# Patient Record
Sex: Female | Born: 1935
Health system: Southern US, Community
[De-identification: ages and names within clinical notes are randomized; demographics above are authoritative.]

## PROBLEM LIST (undated history)

## (undated) DIAGNOSIS — K219 Gastro-esophageal reflux disease without esophagitis: Secondary | ICD-10-CM

## (undated) DIAGNOSIS — I10 Essential (primary) hypertension: Secondary | ICD-10-CM

## (undated) DIAGNOSIS — T7840XA Allergy, unspecified, initial encounter: Secondary | ICD-10-CM

## (undated) DIAGNOSIS — E785 Hyperlipidemia, unspecified: Secondary | ICD-10-CM

## (undated) DIAGNOSIS — N189 Chronic kidney disease, unspecified: Secondary | ICD-10-CM

## (undated) HISTORY — DX: Chronic kidney disease, unspecified: N18.9

## (undated) HISTORY — DX: Essential (primary) hypertension: I10

## (undated) HISTORY — DX: Hyperlipidemia, unspecified: E78.5

## (undated) HISTORY — DX: Allergy, unspecified, initial encounter: T78.40XA

## (undated) HISTORY — PX: TUBAL LIGATION: SHX77

## (undated) HISTORY — PX: OTHER SURGICAL HISTORY: SHX169

## (undated) HISTORY — PX: APPENDECTOMY: SHX54

---

## 2014-02-22 ENCOUNTER — Ambulatory Visit: Payer: Self-pay | Admitting: Family Medicine

## 2014-02-26 ENCOUNTER — Ambulatory Visit: Payer: Self-pay | Admitting: Gastroenterology

## 2014-03-23 ENCOUNTER — Ambulatory Visit: Payer: Self-pay | Admitting: Gastroenterology

## 2014-07-16 ENCOUNTER — Inpatient Hospital Stay: Payer: Self-pay | Admitting: Internal Medicine

## 2014-07-16 LAB — CBC WITH DIFFERENTIAL/PLATELET
Basophil #: 0.1 x10 3/mm 3 (ref 0.0–0.1)
Basophil %: 0.3 %
Eosinophil #: 0 x10 3/mm 3 (ref 0.0–0.7)
Eosinophil %: 0.1 %
HCT: 40.2 % (ref 35.0–47.0)
HGB: 12.9 g/dL (ref 12.0–16.0)
Lymphocyte %: 2.6 %
Lymphs Abs: 0.5 x10 3/mm 3 — ABNORMAL LOW (ref 1.0–3.6)
MCH: 29.1 pg (ref 26.0–34.0)
MCHC: 32 g/dL (ref 32.0–36.0)
MCV: 91 fL (ref 80–100)
Monocyte #: 1 x10 3/mm — ABNORMAL HIGH (ref 0.2–0.9)
Monocyte %: 5 %
Neutrophil #: 18.9 x10 3/mm 3 — ABNORMAL HIGH (ref 1.4–6.5)
Neutrophil %: 92 %
Platelet: 115 x10 3/mm 3 — ABNORMAL LOW (ref 150–440)
RBC: 4.42 X10 6/mm 3 (ref 3.80–5.20)
RDW: 15.3 % — ABNORMAL HIGH (ref 11.5–14.5)
WBC: 20.6 x10 3/mm 3 — ABNORMAL HIGH (ref 3.6–11.0)

## 2014-07-16 LAB — URINALYSIS, COMPLETE
Bilirubin,UR: NEGATIVE
Glucose,UR: NEGATIVE mg/dL (ref 0–75)
Granular Cast: 9
Hyaline Cast: 5
Ketone: NEGATIVE
Nitrite: NEGATIVE
Ph: 5 (ref 4.5–8.0)
Protein: 30
RBC,UR: 15 /HPF (ref 0–5)
Specific Gravity: 1.016 (ref 1.003–1.030)
Squamous Epithelial: 3
Transitional Epi: 3
WBC UR: 271 /HPF (ref 0–5)

## 2014-07-16 LAB — CK-MB
CK-MB: 3.5 ng/mL (ref 0.5–3.6)
CK-MB: 2.9 ng/mL (ref 0.5–3.6)

## 2014-07-16 LAB — BASIC METABOLIC PANEL WITH GFR
Anion Gap: 10 (ref 7–16)
BUN: 54 mg/dL — ABNORMAL HIGH (ref 7–18)
Calcium, Total: 9.1 mg/dL (ref 8.5–10.1)
Chloride: 104 mmol/L (ref 98–107)
Co2: 24 mmol/L (ref 21–32)
Creatinine: 2.3 mg/dL — ABNORMAL HIGH (ref 0.60–1.30)
EGFR (African American): 26 — ABNORMAL LOW
EGFR (Non-African Amer.): 21 — ABNORMAL LOW
Glucose: 105 mg/dL — ABNORMAL HIGH (ref 65–99)
Osmolality: 291 (ref 275–301)
Potassium: 4.1 mmol/L (ref 3.5–5.1)
Sodium: 138 mmol/L (ref 136–145)

## 2014-07-16 LAB — TROPONIN I
TROPONIN-I: 0.47 ng/mL — AB
Troponin-I: 0.67 ng/mL — ABNORMAL HIGH

## 2014-07-17 LAB — BASIC METABOLIC PANEL
Anion Gap: 10 (ref 7–16)
BUN: 48 mg/dL — ABNORMAL HIGH (ref 7–18)
CHLORIDE: 106 mmol/L (ref 98–107)
Calcium, Total: 8.2 mg/dL — ABNORMAL LOW (ref 8.5–10.1)
Co2: 23 mmol/L (ref 21–32)
Creatinine: 1.9 mg/dL — ABNORMAL HIGH (ref 0.60–1.30)
EGFR (African American): 32 — ABNORMAL LOW
GFR CALC NON AF AMER: 26 — AB
Glucose: 109 mg/dL — ABNORMAL HIGH (ref 65–99)
OSMOLALITY: 291 (ref 275–301)
Potassium: 4 mmol/L (ref 3.5–5.1)
SODIUM: 139 mmol/L (ref 136–145)

## 2014-07-17 LAB — CBC WITH DIFFERENTIAL/PLATELET
BASOS ABS: 0.1 10*3/uL (ref 0.0–0.1)
Basophil %: 0.4 %
EOS ABS: 0.1 10*3/uL (ref 0.0–0.7)
Eosinophil %: 0.6 %
HCT: 36.9 % (ref 35.0–47.0)
HGB: 12 g/dL (ref 12.0–16.0)
Lymphocyte #: 0.6 10*3/uL — ABNORMAL LOW (ref 1.0–3.6)
Lymphocyte %: 3.3 %
MCH: 29.3 pg (ref 26.0–34.0)
MCHC: 32.4 g/dL (ref 32.0–36.0)
MCV: 90 fL (ref 80–100)
MONOS PCT: 6.4 %
Monocyte #: 1.1 x10 3/mm — ABNORMAL HIGH (ref 0.2–0.9)
NEUTROS ABS: 15.3 10*3/uL — AB (ref 1.4–6.5)
Neutrophil %: 89.3 %
PLATELETS: 92 10*3/uL — AB (ref 150–440)
RBC: 4.08 10*6/uL (ref 3.80–5.20)
RDW: 15 % — ABNORMAL HIGH (ref 11.5–14.5)
WBC: 17.1 10*3/uL — ABNORMAL HIGH (ref 3.6–11.0)

## 2014-07-17 LAB — CK-MB: CK-MB: 1.8 ng/mL (ref 0.5–3.6)

## 2014-07-17 LAB — HEMOGLOBIN A1C: HEMOGLOBIN A1C: 5.5 % (ref 4.2–6.3)

## 2014-07-17 LAB — MAGNESIUM: Magnesium: 1.5 mg/dL — ABNORMAL LOW

## 2014-07-18 LAB — BASIC METABOLIC PANEL
Anion Gap: 7 (ref 7–16)
BUN: 38 mg/dL — ABNORMAL HIGH (ref 7–18)
Calcium, Total: 7.9 mg/dL — ABNORMAL LOW (ref 8.5–10.1)
Chloride: 110 mmol/L — ABNORMAL HIGH (ref 98–107)
Co2: 24 mmol/L (ref 21–32)
Creatinine: 1.78 mg/dL — ABNORMAL HIGH (ref 0.60–1.30)
EGFR (African American): 34 — ABNORMAL LOW
EGFR (Non-African Amer.): 28 — ABNORMAL LOW
Glucose: 117 mg/dL — ABNORMAL HIGH (ref 65–99)
Osmolality: 291 (ref 275–301)
POTASSIUM: 4.3 mmol/L (ref 3.5–5.1)
SODIUM: 141 mmol/L (ref 136–145)

## 2014-07-18 LAB — CBC WITH DIFFERENTIAL/PLATELET
BASOS PCT: 0.3 %
Basophil #: 0 10*3/uL (ref 0.0–0.1)
EOS PCT: 2.4 %
Eosinophil #: 0.3 10*3/uL (ref 0.0–0.7)
HCT: 35 % (ref 35.0–47.0)
HGB: 11.4 g/dL — AB (ref 12.0–16.0)
LYMPHS ABS: 0.5 10*3/uL — AB (ref 1.0–3.6)
LYMPHS PCT: 3.8 %
MCH: 29.4 pg (ref 26.0–34.0)
MCHC: 32.6 g/dL (ref 32.0–36.0)
MCV: 90 fL (ref 80–100)
Monocyte #: 0.8 x10 3/mm (ref 0.2–0.9)
Monocyte %: 6.9 %
Neutrophil #: 10.3 10*3/uL — ABNORMAL HIGH (ref 1.4–6.5)
Neutrophil %: 86.6 %
Platelet: 94 10*3/uL — ABNORMAL LOW (ref 150–440)
RBC: 3.88 10*6/uL (ref 3.80–5.20)
RDW: 14.9 % — ABNORMAL HIGH (ref 11.5–14.5)
WBC: 11.9 10*3/uL — AB (ref 3.6–11.0)

## 2014-07-18 LAB — MAGNESIUM: Magnesium: 2.5 mg/dL — ABNORMAL HIGH

## 2014-07-18 LAB — URINE CULTURE

## 2014-07-19 LAB — BASIC METABOLIC PANEL
Anion Gap: 7 (ref 7–16)
BUN: 37 mg/dL — AB (ref 7–18)
CALCIUM: 8.5 mg/dL (ref 8.5–10.1)
CO2: 23 mmol/L (ref 21–32)
CREATININE: 1.71 mg/dL — AB (ref 0.60–1.30)
Chloride: 110 mmol/L — ABNORMAL HIGH (ref 98–107)
EGFR (African American): 36 — ABNORMAL LOW
GFR CALC NON AF AMER: 30 — AB
GLUCOSE: 109 mg/dL — AB (ref 65–99)
OSMOLALITY: 289 (ref 275–301)
POTASSIUM: 4.3 mmol/L (ref 3.5–5.1)
SODIUM: 140 mmol/L (ref 136–145)

## 2014-07-21 LAB — CULTURE, BLOOD (SINGLE)

## 2014-08-04 ENCOUNTER — Ambulatory Visit: Payer: Self-pay | Admitting: Family Medicine

## 2014-08-05 ENCOUNTER — Encounter: Payer: Self-pay | Admitting: Podiatry

## 2014-08-05 ENCOUNTER — Ambulatory Visit (INDEPENDENT_AMBULATORY_CARE_PROVIDER_SITE_OTHER): Payer: BLUE CROSS/BLUE SHIELD | Admitting: Podiatry

## 2014-08-05 VITALS — BP 104/60 | HR 78 | Resp 16 | Ht 59.0 in | Wt 122.0 lb

## 2014-08-05 DIAGNOSIS — M79676 Pain in unspecified toe(s): Secondary | ICD-10-CM

## 2014-08-05 DIAGNOSIS — L600 Ingrowing nail: Secondary | ICD-10-CM

## 2014-08-05 DIAGNOSIS — B351 Tinea unguium: Secondary | ICD-10-CM

## 2014-08-05 DIAGNOSIS — B353 Tinea pedis: Secondary | ICD-10-CM

## 2014-08-05 MED ORDER — NAFTIFINE HCL 2 % EX CREA: 1.0000 "application " | TOPICAL_CREAM | CUTANEOUS | Status: DC

## 2014-08-05 NOTE — Progress Notes (Signed)
   Subjective:    Patient ID: Kimberly Abbott, female    DOB: 03-16-1921, 81 y.o.   MRN: [id number]  HPI Comments: 81 year old female presents the office with complaints of painful, elongated toenails with ingrowing on both of the big toes as well as the second toes. She states this is intermittent for years and has been progressive. She states that the toenails are painful with shoes as they are causing pressure. She also states that she has an area on the bottom of her right foot which has some peeling, dry skin with intermittent itching. She states that she has seen multiple other physicians for this however she has not been able to cure this problem. She denies any recent redness or drainage from the nail sites or along the skin lesion on the bottom of her foot. No other complaints at this time.     Review of Systems  Constitutional: Positive for appetite change and fatigue.  HENT: Positive for hearing loss and trouble swallowing.   Psychiatric/Behavioral: The patient is nervous/anxious.   All other systems reviewed and are negative.      Objective:   Physical Exam AAO 3, NAD DP/PT pulses palpable, CRT less than 3 seconds Protective sensation appears to be intact with Derrel Nip monofilament, vibratory sensation intact, Achilles tendon reflex intact. Nails hypertrophic, dystrophic, elongated, brittle, discolored 10. There is incurvation upon nails particularly the bilateral hallux and second digits on both the medial and lateral nail border. There is no surrounding erythema or drainage from the nail sites. On the plantar aspect of the right midfoot there is a area of dry, peeling, scaly skin with a slight erythematous base which is most likely consistent with tinea pedis. No open lesions or pre-ulcerative lesions are identified. No interdigital maceration. No areas of pinpoint bony tenderness or pain with vibratory sensation bilaterally. There is bilateral HAV deformity present. No  pain with calf compression, swelling, warmth, erythema.       Assessment & Plan:  82 year old female with symptomatic onychomycosis/ingrown toenail; likely tinea pedis. -Treatment options were discussed the patient including alternatives, risks, complications. -Nail sharply debrided 10 without complication/bleeding. -For the area on the plantar aspect of the right foot at most likely consistent with tinea pedis. We'll prescribed Naftin. After this treatment if there is no resolution we'll likely refer to dermatology. -Discussed the importance of daily foot inspection. -Follow-up in 3 months or sooner should any problems arise. In the meantime, encouraged to call the office with any questions, concerns, change in symptoms. Follow-up with PCP for other issues mentioned in the ROS.

## 2014-09-13 ENCOUNTER — Ambulatory Visit: Payer: Self-pay | Admitting: Nephrology

## 2014-09-24 LAB — HM MAMMOGRAPHY: HM Mammogram: NORMAL

## 2014-10-21 ENCOUNTER — Ambulatory Visit (INDEPENDENT_AMBULATORY_CARE_PROVIDER_SITE_OTHER): Payer: Medicare Other | Admitting: Podiatry

## 2014-10-21 DIAGNOSIS — B351 Tinea unguium: Secondary | ICD-10-CM | POA: Diagnosis not present

## 2014-10-21 DIAGNOSIS — M79676 Pain in unspecified toe(s): Secondary | ICD-10-CM | POA: Diagnosis not present

## 2014-10-21 DIAGNOSIS — B353 Tinea pedis: Secondary | ICD-10-CM

## 2014-10-21 MED ORDER — NAFTIFINE HCL 2 % EX CREA: 1.0000 "application " | TOPICAL_CREAM | CUTANEOUS | Status: DC

## 2014-10-24 NOTE — Progress Notes (Signed)
Patient ID: Kimberly Abbott, female   DOB: [birth year], 81 y.o.   MRN: [id number]  Subjective: 81 y.o.-year-old female returns the office today for painful, elongated, thickened, ingrown toenails for which she is unable to trim herself. Denies any redness or drainage around the nails. She has been using the Naftin cream for the right foot which she believes is helping and is requesting a refill. Denies any acute changes since last appointment and no new complaints today. Denies any systemic complaints such as fevers, chills, nausea, vomiting.   Objective: AAO 3, NAD DP/PT pulses palpable, CRT less than 3 seconds Protective sensation intact with Simms Weinstein monofilament, Achilles tendon reflex intact.  Nails hypertrophic, dystrophic, elongated, brittle, discolored 10. There is also incurvation on both the medial and lateral aspects of the nails to all digits which she states is painful. There is tenderness overlying the nails 1-5 bilaterally. There is no surrounding erythema or drainage along the nail sites. No open lesions or pre-ulcerative lesions are identified. On the plantar aspect of the right midfoot there is a small area dry, place, scaly skin which is much improved compared to last appointment. No other areas of tenderness bilateral lower extremities. No overlying edema, erythema, increased warmth. No pain with calf compression, swelling, warmth, erythema.  Assessment: Patient presents with symptomatic onychomycosis/ingrown toenails without signs of infection; tinea pedis  Plan: -Treatment options including alternatives, risks, complications were discussed -Nails sharply debrided 10 without complication/bleeding. -Refilled naftin as the area appears improved but not yet resolved.  -Discussed daily foot inspection. If there are any changes, to call the office immediately.  -Follow-up in 3 months or sooner if any problems are to arise. In the meantime, encouraged to call the office  with any questions, concerns, changes symptoms.

## 2014-11-04 ENCOUNTER — Ambulatory Visit: Payer: BLUE CROSS/BLUE SHIELD | Admitting: Podiatry

## 2014-11-08 DIAGNOSIS — L259 Unspecified contact dermatitis, unspecified cause: Secondary | ICD-10-CM | POA: Insufficient documentation

## 2014-11-08 DIAGNOSIS — K219 Gastro-esophageal reflux disease without esophagitis: Secondary | ICD-10-CM | POA: Insufficient documentation

## 2014-11-08 DIAGNOSIS — M75101 Unspecified rotator cuff tear or rupture of right shoulder, not specified as traumatic: Secondary | ICD-10-CM | POA: Insufficient documentation

## 2014-11-08 DIAGNOSIS — N183 Chronic kidney disease, stage 3 unspecified: Secondary | ICD-10-CM | POA: Insufficient documentation

## 2014-11-08 DIAGNOSIS — M542 Cervicalgia: Secondary | ICD-10-CM

## 2014-11-08 DIAGNOSIS — R1310 Dysphagia, unspecified: Secondary | ICD-10-CM | POA: Insufficient documentation

## 2014-11-08 DIAGNOSIS — N3946 Mixed incontinence: Secondary | ICD-10-CM

## 2014-11-08 DIAGNOSIS — J309 Allergic rhinitis, unspecified: Secondary | ICD-10-CM | POA: Insufficient documentation

## 2014-11-08 DIAGNOSIS — R1319 Other dysphagia: Secondary | ICD-10-CM

## 2014-11-08 DIAGNOSIS — E7800 Pure hypercholesterolemia, unspecified: Secondary | ICD-10-CM

## 2014-11-08 DIAGNOSIS — M1A20X1 Drug-induced chronic gout, unspecified site, with tophus (tophi): Secondary | ICD-10-CM | POA: Insufficient documentation

## 2014-11-08 DIAGNOSIS — R0602 Shortness of breath: Secondary | ICD-10-CM | POA: Insufficient documentation

## 2014-11-08 DIAGNOSIS — N390 Urinary tract infection, site not specified: Secondary | ICD-10-CM

## 2014-11-08 DIAGNOSIS — Z9181 History of falling: Secondary | ICD-10-CM | POA: Insufficient documentation

## 2014-11-08 DIAGNOSIS — F419 Anxiety disorder, unspecified: Secondary | ICD-10-CM | POA: Insufficient documentation

## 2014-11-08 DIAGNOSIS — E785 Hyperlipidemia, unspecified: Secondary | ICD-10-CM | POA: Insufficient documentation

## 2014-11-08 DIAGNOSIS — F329 Major depressive disorder, single episode, unspecified: Secondary | ICD-10-CM | POA: Insufficient documentation

## 2014-11-08 DIAGNOSIS — F32A Depression, unspecified: Secondary | ICD-10-CM | POA: Insufficient documentation

## 2014-11-08 DIAGNOSIS — L989 Disorder of the skin and subcutaneous tissue, unspecified: Secondary | ICD-10-CM | POA: Insufficient documentation

## 2014-11-08 DIAGNOSIS — I129 Hypertensive chronic kidney disease with stage 1 through stage 4 chronic kidney disease, or unspecified chronic kidney disease: Secondary | ICD-10-CM | POA: Insufficient documentation

## 2014-11-08 DIAGNOSIS — D649 Anemia, unspecified: Secondary | ICD-10-CM | POA: Insufficient documentation

## 2014-11-08 DIAGNOSIS — I10 Essential (primary) hypertension: Secondary | ICD-10-CM | POA: Insufficient documentation

## 2014-11-08 DIAGNOSIS — G8929 Other chronic pain: Secondary | ICD-10-CM

## 2014-11-08 DIAGNOSIS — E46 Unspecified protein-calorie malnutrition: Secondary | ICD-10-CM | POA: Insufficient documentation

## 2014-11-08 DIAGNOSIS — N3289 Other specified disorders of bladder: Secondary | ICD-10-CM | POA: Insufficient documentation

## 2014-11-08 DIAGNOSIS — M1990 Unspecified osteoarthritis, unspecified site: Secondary | ICD-10-CM | POA: Insufficient documentation

## 2014-11-20 NOTE — Discharge Summary (Signed)
PATIENT NAME:  Kimberly Abbott, Kimberly Abbott MR#:  [id number] DATE OF BIRTH:  January 02, 1921  DATE OF ADMISSION:  07/16/2014 DATE OF DISCHARGE:    PRIMARY CARE PHYSICIAN:  Dr. Nadine Counts.    DISCHARGE DIAGNOSES:  1.  Urinary tract infection with the possibility of pyelonephritis and mild hydronephrosis.  2.  Acute renal failure with dehydration.  3.  Elevated troponin, possibly due to acute renal failure.  4.  Thrombocytopenia.  5.  Diverticulosis with without diverticulitis.   CONDITION: Stable.   CODE STATUS: Full code.   HOME MEDICATIONS: Please refer to the medication reconciliation list.   CODE STATUS:  DNR.    HOME HEALTH:  The patient needs home health and physical therapy with RN and nurse aide.   DIET: Low-sodium, low-fat, low-cholesterol diet.   ACTIVITY: As tolerated.   FOLLOWUP CARE:   Follow with PCP within 1-2 weeks.   REASON FOR ADMISSION:  Chills and generalized weakness, poor oral intake for 3 days.   HISTORY OF PRESENT ILLNESS: A 81 year old African-American female with a history of hypertension, hyperlipidemia, who presented to the ED with chills and generalized weakness for 3 days. The patient was noticed to have a UTI and increased creatinine, admitted for UTI.  For detailed history and physical examination please refer to the admission note dictated by me on admission date.  Chest x-ray did not show any edema or consolidation. CAT scan of abdomen and pelvis showed mild right hydronephrosis with stranding about the right kidney, possible pyelonephritis, extensive sigmoid diverticulosis without diverticulitis. The patient's WBC was 20.6, hemoglobin 12.9, BUN 54,  creatinine 2.3. Electrolytes were normal. Troponin 0.67.  Urinalysis showed WBC 271, RBC 15, nitrite negative.    1.  For UTI with possibility of pyelonephritis with some mild hydronephrosis, after admission the patient has been treated with Rocephin. Blood culture is negative. Urine culture showed Escherichia coli  sensitive to Cipro.  The patient's white count decreased to normal range. The patient's symptoms have much improved.  2.  For acute renal failure, the patient has been treated with IV fluid support. BUN decreased to 37, creatinine decreased to 1.71. Renal function has been improving.  3.  Hypomagnesemia. The patient's magnesium was 1.5, after supplement increased to 2.5. 4.  Hypertension. The patient is not on hypertension medication. Blood pressure is controlled. 5.  Elevated troponin possibly due to acute renal failure.  A consult with Dr. Clayborn Bigness, no further workup.  6.  The patient has no complaints. Vital signs are stable. According to physical therapy evaluation the patient needs physical therapy with home health with RN and nurse aide. I discussed the patient's condition and plan of treatment with the patient, nurse, case manager, and physical therapist.   TIME SPENT: About 39 minutes.     ____________________________ Demetrios Loll, MD qc:bu D: 07/19/2014 11:35:00 ET T: 07/19/2014 12:53:03 ET JOB#: [id number]  cc: Demetrios Loll, MD, <Dictator> Demetrios Loll MD ELECTRONICALLY SIGNED 07/21/2014 15:38

## 2014-11-20 NOTE — H&P (Signed)
PATIENT NAME:  Kimberly Abbott, Kimberly Abbott MR#:  [id number] DATE OF BIRTH:  February 06, 1921  DATE OF ADMISSION:  07/16/2014  CHIEF COMPLAINT:  Chills and generalized weakness, poor oral intake for the past 3 days.   HISTORY OF PRESENT ILLNESS: A 81 year old female with a history of hypertension, hyperlipidemia, presented to the ED with above chief complaint.  The patient is alert, awake, oriented, in no acute distress. The patient started to have chills 3 days ago, which has been worsening. In addition, the patient has poor oral intake and vomited today. The patient also complains of some diarrhea, but denies any melena or bloody stools. The patient denies any hematuria, dysuria or incontinence, but has urine frequency.  The patient also complains of cough for about several weeks with sputum, but she denies any chest pain, palpitation, orthopnea or nocturnal dyspnea. No leg edema. The patient was noted to have UTI and increased creatinine.  Since the patient also complains of abdominal pain on the left side, the patient will get a CAT scan of abdomen and pelvis.   PAST MEDICAL HISTORY: Hypertension, hyperlipidemia.    PAST SURGICAL HISTORY: Appendectomy, rectal fistula surgery.   HOME MEDICATIONS:  1. Zantac 75 mg p.o. b.i.d.  2. Zocor 20 mg p.o. at bedtime.  3.  Refresh ophthalmic solution 1 drop to each effected eye twice a day.  4. Oxybutynin 5 mg p.o. b.i.d.  5. Meloxicam 15 mg p.o. daily.  6. Loperamide 2 mg p.o. every 4 hours p.r.n. for diarrhea.  7. Lansoprazole 15 mg p.o. once a day.  8. Fluticasone nasal 50 mcg one spray once a day.  9. Alprazolam 0.5 mg p.o. b.i.d.  10. Allopurinol 100 mg p.o. b.i.d.  11. Allegra 180 mg p.o. daily.  12. Acetaminophen/codeine 300/30 mg p.o. tablets 1 tablet b.i.d. p.r.n.   REVIEW OF SYSTEMS:   CONSTITUTIONAL: The patient denies any fever, but has chills. No headache or dizziness, but has generalized weakness and poor oral intake and a poor appetite.  EYES: No  double or blurred vision.   ENT: No postnasal drip, slurred speech or dysphagia.  CARDIOVASCULAR: No chest pain, palpitation, orthopnea or nocturnal dyspnea. No leg edema.  PULMONARY: Positive for cough with sputum.  No shortness of breath or hematemesis.  GASTROINTESTINAL:  Positive for left side abdominal pain, no vomiting, diarrhea. No melena or bloody stool.  GENITOURINARY: No dysuria, hematuria or incontinence.  SKIN: No rash or jaundice.  NEUROLOGIC: No syncope, loss of consciousness or seizure.  ENDOCRINE: No polyuria, polydipsia, heat or cold intolerance.  HEMATOLOGY: No easy bleeding or bleeding.   PHYSICAL EXAMINATION:  VITAL SIGNS: Temperature 98.1, blood pressure 100/59, pulse 99, oxygen saturation 96% on room air.  GENERAL: The patient is alert, awake, oriented, in no acute distress.  HEENT: Pupils round, equal and reactive to light and accommodation. Dry oral mucosa. Clear oropharynx.  NECK: Supple. No JVD or carotid bruit. No lymphadenopathy. No thyromegaly.  CARDIOVASCULAR: S1, S2.  Regular rate, rhythm. No murmurs or gallops.  PULMONARY: Bilateral air entry. No wheezing or rales. No use of accessory muscle to breathe.  ABDOMEN: Soft. Tenderness on the left side. No rigidity. No rebound. Bowel sounds present. No organomegaly.  EXTREMITIES: No edema, clubbing or cyanosis. No calf tenderness. Bilateral pedal pulses present.  SKIN: No rash or jaundice.  NEUROLOGY: Alert and oriented x3. No focal deficit. Power 5/5. Sensation intact.   LABORATORY DATA: Chest x-ray:  No edema or consolidation. CAT scan of the abdomen and pelvis showed mild right  hydronephrosis with stranding about the right kidney, could be distal right urethral stone or Pyelonephritis, extensive sigmoid diverticulosis without diverticulitis. WBC 20.6, hemoglobin 12.9, platelets 115,000, glucose 105, BUN 54, creatinine 2.3.  Electrolytes normal. Troponin 0.67. Urinalysis showed WBC 271, RBC 15, nitrite negative.    IMPRESSIONS:  1. Urinary tract infection with the possibility of pyelonephritis.  2. Leukocytosis due to urinary tract infection.  3. Acute renal failure with dehydration.  4. Diverticulosis without diverticulitis.  5. Hypertension.  6. Hyperlipidemia.   PLAN OF TREATMENT:  1. The patient will be admitted to medical floor with telemonitor for UTI with possible pyelonephritis.   We will start Rocephin, Cipro.  Follow up CBC, blood culture, urine culture.  2. For acute renal failure with dehydration, we will give IV fluid support and follow up BMP.  3. For elevated troponin, which is possibly due to acute renal failure, we will start aspirin and Zocor.  Follow up troponin level and get a cardiology consult.  4. Patient also has thrombocytopenia. Need to follow-up CBC.  5. For hypertension, the patient's blood pressure is low side.  We will not give any hypertension medication.  6. I discussed the patient's condition and plan of treatment with the patient, the patient's daughter, who is POA. The patient wants DNR.   TIME SPENT: About 58 minutes.   ____________________________ Demetrios Loll, MD qc:by D: 07/16/2014 19:10:20 ET T: 07/16/2014 20:37:44 ET JOB#: [id number]  cc: Demetrios Loll, MD, <Dictator> Demetrios Loll MD ELECTRONICALLY SIGNED 07/17/2014 15:00

## 2014-11-24 NOTE — Consult Note (Signed)
PATIENT NAME:  Kimberly Abbott, Kimberly Abbott MR#:  [id number] DATE OF BIRTH:  1921-04-03  DATE OF CONSULTATION:  07/17/2014  CONSULTING PHYSICIAN:  Carmela Piechowski D. Phoebie Shad, MD  REASON FOR CONSULTATION: Generalized weakness, elevated troponin.  HISTORY OF PRESENT ILLNESS: The patient is a 81 year old female with hypertension, hyperlipidemia, presented to the Emergency Room with generalized weakness, fatigue and chills. The patient was awake and alert. The patient's has had the chills for 3 days. In addition, the patient has poor appetite, decreased intake and vomiting today. The patient complains of some diarrhea. Denies any bloody stool. She denies any hematuria, dysuria or incontinence. The patient also complains of cough with some sputum production. Denied any chest pain. No orthopnea or PND. No leg edema. Noted to have a UTI and increased creatinine. Complaining of vague abdominal discomfort on the left side and was advised to be admitted for further evaluation and care. Troponin was found to be slightly elevated so cardiology was asked to see the patient.   PAST MEDICAL HISTORY: Hypertension, hyperlipidemia.   PAST SURGICAL HISTORY: Appendectomy, rectal fistula surgery.  HOME MEDICATIONS:  1. Zanaflex 75 a day. 2. Zocor 20 a day. 3. Eye drops for glaucoma.  4.  Oxybutynin 5 mg twice a day. 5. Meloxicam 15 mg a day. 6. Loperamide 2 mg every 4 hours p.r.n. for diarrhea.  7. Lansoprazole 15 mg once a day. 8.   Fluticasone 50 mcg spray twice a day. 9. Alprazolam 0.5 mg twice a day.  10. Allopurinol 100 mg twice a day.  11. Allegra 180 mg daily.  12. Tylenol plus codeine p.r.n. for pain.   REVIEW OF SYSTEMS: Denies blackout spells or syncope. She has had some mild nausea and vomiting. No diarrhea. No weight loss. No weight gain. No hemoptysis or hematemesis. No bright red blood pr rectum. She is complaining of generalized weakness and fatigue but no urinary symptoms. No chest pain.    PHYSICAL  EXAMINATION: VITAL SIGNS: Blood pressure 110/63, pulse of 80 HEENT: Normocephalic, atraumatic. Pupils equal and reactive to light.  NECK: Supple. No significant JVD, bruits, or adenopathy.  LUNGS: Clear to auscultation and percussion. No significant wheeze, rhonchi, or rales.   HEART wnl ABDOMEN: Benign. EXTREMITIES:wnl NEUROLOGIC: Exam intact.   LABORATORY DATA:  Chest x-ray is negative. CAT scan of the abdomen and pelvis showed mild hydronephrosis with stranding of the right kidney, possibly ureteral stone. White count of 20,000, hemoglobin 12.9, platelet count of 115,000. BUN of 54, creatinine 2.3. Electrolytes are normal. Troponin 0.67. UA positive for white cells.   IMPRESSION: Urinary tract infection, possible pyelonephritis, leukocytosis, mild hypotension, acute chronic renal failure with dehydration, diverticulosis but no diverticulitis, hypertension, hyperlipidemia, demand ischemia, elevated troponin, possibly related to severe renal insufficiency.   PLAN:  1.  Agree evaluation with follow up troponins. Recommend mild hydration. Echocardiogram would be helpful. Short-term anticoagulation especially with DVT prophylaxis. Do not believe this is a primary cardiac event. I think it is probably related to demand ischemia at this point.  2.  Urinary tract infection. Continue treatment of UTI with antibiotics and hydration. Follow up white count and different studies, especially urine culture. 3.  Renal insufficiency. Recommend hydration. Follow up BMP. Consider evaluation by nephrology. I do not know if she needs any therapy for urinary obstruction. Evaluation of troponins should  be followed. Recommend hypertension therapy, but she is slightly hypotensive, possibly related to infection. Maintain DNR. Recommend continued therapy. Echocardiogram is probably the only study for her heart at this point and follow  up troponins. Do not recommend any further studies. EKG may be helpful as well as  telemetry for 24 to 48 hours and then transfer to the medical floor.     ____________________________ Loran Senters Clayborn Bigness, MD ddc:TT D: 07/18/2014 11:57:00 ET T: 07/18/2014 18:38:34 ET JOB#: [id number]  cc: Amonie Wisser D. Clayborn Bigness, MD, <Dictator> Yolonda Kida MD ELECTRONICALLY SIGNED 08/06/2014 22:20

## 2014-12-28 ENCOUNTER — Ambulatory Visit (INDEPENDENT_AMBULATORY_CARE_PROVIDER_SITE_OTHER): Payer: Medicare Other | Admitting: Podiatry

## 2014-12-28 DIAGNOSIS — M79676 Pain in unspecified toe(s): Secondary | ICD-10-CM

## 2014-12-28 DIAGNOSIS — L309 Dermatitis, unspecified: Secondary | ICD-10-CM

## 2014-12-28 DIAGNOSIS — B351 Tinea unguium: Secondary | ICD-10-CM

## 2014-12-29 ENCOUNTER — Telehealth: Payer: Self-pay

## 2014-12-29 NOTE — Progress Notes (Signed)
Patient ID: Kimberly Abbott, female   DOB: November 21, 1920, 81 y.o.   MRN: [id number]  Subjective: 81 y.o.-year-old female  returns the office today for painful, elongated, thickened toenails which are ingrowing especially the big toenails. Denies any redness or drainage around the nails.  She also states the rash on the plantar midfoot is starting to worsen again. She has had multiple treatments by many different physicians without any resolution. Denies any acute changes since last appointment and no new complaints today. Denies any systemic complaints such as fevers, chills, nausea, vomiting.   Objective: AAO 3, NAD DP/PT pulses palpable, CRT less than 3 seconds Protective sensation intact with Simms Weinstein monofilament, Achilles tendon reflex intact.  Nails hypertrophic, dystrophic, elongated, brittle, discolored 10. There is evidence of ingrowing on all the nails especially bilateral hallux and 2nd digit toenails. There is tenderness overlying these nails. There is no surrounding erythema or drainage along the nail sites. On the plantar aspect of the right foot along the midfoot there is dry, peeling, scaly skin but erythematous base. There are small papules present in the area. Subjective the patient states that she will drainage areas which reveal a "bloody mucus" type fluid. There is no edema, erythema, or increase in warmth to the area. No ascending cellulitis, no drainage/malodor.  No open lesions or pre-ulcerative lesions are identified. No other areas of tenderness bilateral lower extremities. No overlying edema, erythema, increased warmth. No pain with calf compression, swelling, warmth, erythema.  Assessment: Patient presents with symptomatic onychomycosis; right midfoot chronic rash  Plan: -Treatment options including alternatives, risks, complications were discussed -Nails sharply debrided 79 without complication. A small amount of bleeding occurred during removal of the deep portion  of the ingrown toenail (per the patient's request) to the right hallux and second digit toenail. Area was cleaned and antibiotic ointment was applied followed by a Band-Aid. Recommended the patient to continue this at home until healed. There is not healing in 2 weeks of his problems the meantime call the office. She understood this.  -Despite multiple treatments by many physicians to the right midfoot rash she is to have reoccurrence again. Due to this will refer to dermatology for further evaluation. -Discussed daily foot inspection. If there are any changes, to call the office immediately.  -Follow-up in 3 months or sooner if any problems are to arise. In the meantime, encouraged to call the office with any questions, concerns, changes symptoms.

## 2014-12-29 NOTE — Telephone Encounter (Signed)
[facility] [phone] per Dr Jacqualyn Posey

## 2014-12-31 ENCOUNTER — Encounter: Payer: Self-pay | Admitting: Family Medicine

## 2014-12-31 ENCOUNTER — Other Ambulatory Visit: Payer: Self-pay | Admitting: Family Medicine

## 2014-12-31 ENCOUNTER — Ambulatory Visit (INDEPENDENT_AMBULATORY_CARE_PROVIDER_SITE_OTHER): Payer: Medicare Other | Admitting: Family Medicine

## 2014-12-31 VITALS — BP 132/80 | HR 96 | Temp 97.7°F | Resp 16 | Ht 61.0 in | Wt 122.0 lb

## 2014-12-31 DIAGNOSIS — R0602 Shortness of breath: Secondary | ICD-10-CM

## 2014-12-31 DIAGNOSIS — I10 Essential (primary) hypertension: Secondary | ICD-10-CM

## 2014-12-31 DIAGNOSIS — N183 Chronic kidney disease, stage 3 unspecified: Secondary | ICD-10-CM

## 2014-12-31 DIAGNOSIS — F419 Anxiety disorder, unspecified: Secondary | ICD-10-CM | POA: Diagnosis not present

## 2014-12-31 DIAGNOSIS — M25511 Pain in right shoulder: Secondary | ICD-10-CM | POA: Insufficient documentation

## 2014-12-31 DIAGNOSIS — J45909 Unspecified asthma, uncomplicated: Secondary | ICD-10-CM

## 2014-12-31 DIAGNOSIS — M1000 Idiopathic gout, unspecified site: Secondary | ICD-10-CM | POA: Diagnosis not present

## 2014-12-31 DIAGNOSIS — M1A9XX1 Chronic gout, unspecified, with tophus (tophi): Secondary | ICD-10-CM | POA: Insufficient documentation

## 2014-12-31 DIAGNOSIS — Z23 Encounter for immunization: Secondary | ICD-10-CM | POA: Insufficient documentation

## 2014-12-31 DIAGNOSIS — J309 Allergic rhinitis, unspecified: Secondary | ICD-10-CM | POA: Insufficient documentation

## 2014-12-31 DIAGNOSIS — N3946 Mixed incontinence: Secondary | ICD-10-CM | POA: Diagnosis not present

## 2014-12-31 DIAGNOSIS — M25512 Pain in left shoulder: Secondary | ICD-10-CM | POA: Diagnosis not present

## 2014-12-31 DIAGNOSIS — I130 Hypertensive heart and chronic kidney disease with heart failure and stage 1 through stage 4 chronic kidney disease, or unspecified chronic kidney disease: Secondary | ICD-10-CM | POA: Insufficient documentation

## 2014-12-31 DIAGNOSIS — N390 Urinary tract infection, site not specified: Secondary | ICD-10-CM | POA: Insufficient documentation

## 2014-12-31 DIAGNOSIS — L309 Dermatitis, unspecified: Secondary | ICD-10-CM | POA: Insufficient documentation

## 2014-12-31 DIAGNOSIS — M1A00X1 Idiopathic chronic gout, unspecified site, with tophus (tophi): Secondary | ICD-10-CM

## 2014-12-31 DIAGNOSIS — I129 Hypertensive chronic kidney disease with stage 1 through stage 4 chronic kidney disease, or unspecified chronic kidney disease: Secondary | ICD-10-CM

## 2014-12-31 DIAGNOSIS — R0982 Postnasal drip: Secondary | ICD-10-CM | POA: Diagnosis not present

## 2014-12-31 MED ORDER — OXYBUTYNIN CHLORIDE 5 MG PO TABS: 5.0000 mg | ORAL_TABLET | Freq: Two times a day (BID) | ORAL | Status: DC

## 2014-12-31 MED ORDER — ALPRAZOLAM 0.5 MG PO TABS: 0.5000 mg | ORAL_TABLET | Freq: Two times a day (BID) | ORAL | Status: DC

## 2014-12-31 MED ORDER — LISINOPRIL 10 MG PO TABS: 10.0000 mg | ORAL_TABLET | Freq: Every day | ORAL | Status: DC

## 2014-12-31 MED ORDER — ALPRAZOLAM 0.5 MG PO TABS: 0.5000 mg | ORAL_TABLET | Freq: Two times a day (BID) | ORAL | Status: AC | PRN

## 2014-12-31 MED ORDER — ALLOPURINOL 100 MG PO TABS: 100.0000 mg | ORAL_TABLET | Freq: Every day | ORAL | Status: DC

## 2014-12-31 MED ORDER — ALPRAZOLAM 0.5 MG PO TABS: 0.5000 mg | ORAL_TABLET | Freq: Two times a day (BID) | ORAL | Status: DC | PRN

## 2014-12-31 MED ORDER — TRAMADOL HCL 50 MG PO TABS: 50.0000 mg | ORAL_TABLET | Freq: Two times a day (BID) | ORAL | Status: DC | PRN

## 2014-12-31 NOTE — Progress Notes (Deleted)
Name: Kimberly Abbott   MRN: [id number]    DOB: [birth year]   Date:12/31/2014       Progress Note  Subjective  Chief Complaint  Chief Complaint  Patient presents with  . Shoulder Pain    Left, cannot sleep on it, very painful.  . Shortness of Breath    Upon activity.  . Nasal Congestion    Patient stated that she has lots of mucous.    HPI  Patient is here today for a Female Medicare Wellness Visit:  Has the patient filled out the following forms:  Fall risk screening: {YES NO:22349} Depression screening: {YES NO:22349} Activities of daily living: {YES NO:22349}  Does the patient have End Of Life Planning documents: {YES DJ:24268}  List other medical providers the patient sees as well as their specialty:  Patient is currently {Marital Status:22678}. Current work status is {Work history:33094}. Any hearing problems: {YES NO:22349}  Preventative and Health Maintenance exam and test date:  1) Cervical cancer screening PAP test:  2) Breast cancer screening Mammogram: 3) Colon cancer screening Colonoscopy (or Stool Occult Blood, Cologuard):  4) Dental exam: 5) Eye exam:  6) Bone density exam:  7) Flu Shot: 8) Pneumonia Shot:  9) Shingles Shot:  No problem-specific assessment & plan notes found for this encounter.   Past Medical History  Diagnosis Date  . Hypertension   . Hyperlipidemia   . Allergy   . CKD (chronic kidney disease)     Past Surgical History  Procedure Laterality Date  . Appendectomy    . Tubal ligation    . Rectal fistula  in the 40's    No family history on file.  History   Social History  . Marital Status: Widowed    Spouse Name: N/A  . Number of Children: N/A  . Years of Education: N/A   Occupational History  . Not on file.   Social History Main Topics  . Smoking status: Never Smoker   . Smokeless tobacco: Not on file  . Alcohol Use: No  . Drug Use: No  . Sexual Activity: Not Currently   Other Topics Concern  . Not on file    Social History Narrative     Current outpatient prescriptions:  .  aspirin 81 MG chewable tablet, Chew 1 tablet by mouth daily., Disp: , Rfl:  .  Cholecalciferol (VITAMIN D3) 2000 UNITS CHEW, Chew 1 tablet by mouth daily., Disp: , Rfl:  .  clobetasol cream (TEMOVATE) 3.41 %, Apply 1 application topically 2 (two) times daily as needed., Disp: , Rfl:  .  lisinopril (PRINIVIL,ZESTRIL) 10 MG tablet, Take 1 tablet by mouth daily., Disp: , Rfl:  .  mirtazapine (REMERON SOL-TAB) 15 MG disintegrating tablet, , Disp: , Rfl:  .  Naftifine HCl (NAFTIN) 2 % CREA, Apply 1 application topically 1 day or 1 dose., Disp: 60 g, Rfl: 2 .  triamcinolone (KENALOG) 0.025 % cream, Apply 1 application topically 2 (two) times daily as needed., Disp: , Rfl:  .  allopurinol (ZYLOPRIM) 100 MG tablet, Take 1 tablet by mouth daily., Disp: , Rfl:  .  ALPRAZolam (XANAX) 0.5 MG tablet, Take 1 tablet by mouth 2 (two) times daily., Disp: , Rfl:  .  oxybutynin (DITROPAN) 5 MG tablet, Take 1 tablet by mouth 2 (two) times daily., Disp: , Rfl:   Allergies  Allergen Reactions  . Penicillin G Hives  . Penicillins Hives    Fall Risk: Fall Risk  12/31/2014  Falls in the past year?  Yes  Number falls in past yr: 2 or more  Injury with Fall? No  Risk for fall due to : Other (Comment)    Depression screen [facility] 2/9 12/31/2014  Decreased Interest 0  Down, Depressed, Hopeless 0  PHQ - 2 Score 0     ROS  CONSTITUTIONAL: No significant weight changes, fever, chills, weakness or fatigue.  HEENT:  - Eyes: No visual changes.  - Ears: No auditory changes. No pain.  - Nose: No sneezing, congestion, runny nose. - Throat: No sore throat. No changes in swallowing. SKIN: No rash or itching.  CARDIOVASCULAR: No chest pain, chest pressure or chest discomfort. No palpitations or edema.  RESPIRATORY: No shortness of breath, cough or sputum.  GASTROINTESTINAL: No anorexia, nausea, vomiting. No changes in bowel habits. No abdominal  pain or blood.  GENITOURINARY: No dysuria. No frequency. No discharge.  NEUROLOGICAL: No headache, dizziness, syncope, paralysis, ataxia, numbness or tingling in the extremities. No memory changes. No change in bowel or bladder control.  MUSCULOSKELETAL: No joint pain. No muscle pain. HEMATOLOGIC: No anemia, bleeding or bruising.  LYMPHATICS: No enlarged lymph nodes.  PSYCHIATRIC: No change in mood. No change in sleep pattern.  ENDOCRINOLOGIC: No reports of sweating, cold or heat intolerance. No polyuria or polydipsia.   Objective  Filed Vitals:   12/31/14 1447  BP: 132/80  Pulse: 96  Temp: 97.7 F (36.5 C)  TempSrc: Oral  Resp: 16  Height: 5\' 1"  (1.549 m)  Weight: 122 lb (55.339 kg)  SpO2: 98%    Physical Exam  Constitutional: Patient appears well-developed and well-nourished. In no distress.  HEENT:  - Head: Normocephalic and atraumatic.  - Ears: Bilateral TMs gray, no erythema or effusion - Nose: Nasal mucosa moist - Mouth/Throat: Oropharynx is clear and moist. No tonsillar hypertrophy or erythema. No post nasal drainage.  - Eyes: Conjunctivae clear, EOM movements normal. PERRLA. No scleral icterus.  Neck: Normal range of motion. Neck supple. No JVD present. No thyromegaly present.  Cardiovascular: Normal rate, regular rhythm and normal heart sounds.  No murmur heard.  Pulmonary/Chest: Effort normal and breath sounds normal. No respiratory distress. Musculoskeletal: Normal range of motion bilateral UE and LE, no joint effusions. Peripheral vascular: Bilateral LE no edema. Neurological: CN II-XII grossly intact with no focal deficits. Alert and oriented to person, place, and time. Coordination, balance, strength, speech and gait are normal.  Skin: Skin is warm and dry. No rash noted. No erythema.  Psychiatric: Patient has a normal mood and affect. Behavior is normal in office today. Judgment and thought content normal in office today.   Assessment & Plan  Problem List  Items Addressed This Visit      Respiratory   Allergic rhinitis, mild - Primary     Other   Shortness of breath at rest   Anxiety   Relevant Medications   ALPRAZolam (XANAX) 0.5 MG tablet   Left shoulder pain      Meds ordered this encounter  Medications  . oxybutynin (DITROPAN) 5 MG tablet    Sig: Take 1 tablet by mouth 2 (two) times daily.  Marland Kitchen ALPRAZolam (XANAX) 0.5 MG tablet    Sig: Take 1 tablet by mouth 2 (two) times daily.  Marland Kitchen allopurinol (ZYLOPRIM) 100 MG tablet    Sig: Take 1 tablet by mouth daily.

## 2014-12-31 NOTE — Progress Notes (Signed)
Name: Kimberly Abbott   MRN: [id number]    DOB: 1920/08/15   Date:12/31/2014       Progress Note  Subjective  Chief Complaint  Chief Complaint  Patient presents with  . Shoulder Pain    Left, cannot sleep on it, very painful.  . Shortness of Breath    Upon activity.  . Nasal Congestion    Patient stated that she has lots of mucous.    HPI  Kimberly Abbott is here today for a general follow up visit. Today she reports continued nasal congestion with post nasal drainage. No fevers, chills, headaches, myalgias. She also reports shortness of breath with activity. No chest pain, cough, lower extremity swelling. She also reports left shoulder pain. She has had a long history of right shoulder pain with decreased range of motion due to rotator pathology.  She was just over at [facility] and needs some lab work done: BMP, Urinanalysis, Spot urine protein to creatine ratio. Needs 90 day refills of all of her medications.  Patient Active Problem List   Diagnosis Date Noted  . Allergic rhinitis 12/31/2014  . Anxiety 12/31/2014  . Chronic gouty arthropathy with tophus (tophi) 12/31/2014  . Dermatitis, eczematoid 12/31/2014  . Heart & renal disease, hypertensive, with heart failure 12/31/2014  . Routine general medical examination at a health care facility 12/31/2014  . Mixed incontinence 12/31/2014  . Chronic urinary tract infection 12/31/2014  . Left shoulder pain 12/31/2014  . Hypercholesteremia 11/08/2014  . Shortness of breath at rest 11/08/2014  . Anemia 11/08/2014  . Contact dermatitis 11/08/2014  . Urinary tract infection 11/08/2014  . Depression 11/08/2014  . Hypertensive kidney disease with chronic kidney disease stage III 11/08/2014  . Allergic rhinitis, mild 11/08/2014  . Gastroesophageal reflux disease without esophagitis 11/08/2014  . Mixed urge and stress incontinence 11/08/2014  . At moderate risk for fall 11/08/2014  . Esophageal dysphagia 11/08/2014  .  Rotator cuff syndrome of right shoulder 11/08/2014  . Dermatosis 11/08/2014  . Acute anxiety 11/08/2014  . 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 Protein-calorie malnutrition 11/08/2014  . Neck pain, chronic 11/08/2014  . HTN, goal below 150/90 11/08/2014  . Arthritis 11/08/2014    History  Substance Use Topics  . Smoking status: Never Smoker   . Smokeless tobacco: Not on file  . Alcohol Use: No     Current outpatient prescriptions:  .  aspirin 81 MG chewable tablet, Chew 1 tablet by mouth daily., Disp: , Rfl:  .  Cholecalciferol (VITAMIN D3) 2000 UNITS CHEW, Chew 1 tablet by mouth daily., Disp: , Rfl:  .  clobetasol cream (TEMOVATE) 1.51 %, Apply 1 application topically 2 (two) times daily as needed., Disp: , Rfl:  .  lisinopril (PRINIVIL,ZESTRIL) 10 MG tablet, Take 1 tablet by mouth daily., Disp: , Rfl:  .  mirtazapine (REMERON SOL-TAB) 15 MG disintegrating tablet, , Disp: , Rfl:  .  Naftifine HCl (NAFTIN) 2 % CREA, Apply 1 application topically 1 day or 1 dose., Disp: 60 g, Rfl: 2 .  triamcinolone (KENALOG) 0.025 % cream, Apply 1 application topically 2 (two) times daily as needed., Disp: , Rfl:  .  allopurinol (ZYLOPRIM) 100 MG tablet, Take 1 tablet by mouth daily., Disp: , Rfl:  .  ALPRAZolam (XANAX) 0.5 MG tablet, Take 1 tablet by mouth 2 (two) times daily., Disp: , Rfl:  .  oxybutynin (DITROPAN) 5 MG tablet, Take 1 tablet by mouth 2 (two) times daily., Disp: ,  Rfl:   Allergies  Allergen Reactions  . Penicillin G Hives  . Penicillins Hives    Review of Systems  Ten systems reviewed and is negative except as mentioned in HPI.    Objective  BP 132/80 mmHg  Pulse 96  Temp(Src) 97.7 F (36.5 C) (Oral)  Resp 16  Ht 5\' 1"  (1.549 m)  Wt 122 lb (55.339 kg)  BMI 23.06 kg/m2  SpO2 98%  Physical Exam  Constitutional: Patient appears well-developed and well-nourished. In no distress.  HEENT:  - Head: Normocephalic and atraumatic.  - Ears: Bilateral TMs  gray, no erythema or effusion - Nose: Nasal mucosa moist - Mouth/Throat: Oropharynx is clear and moist. No tonsillar hypertrophy or erythema. No post nasal drainage.  - Eyes: Conjunctivae clear, EOM movements normal. PERRLA. No scleral icterus.  Neck: Normal range of motion. Neck supple. No JVD present. No thyromegaly present.  Cardiovascular: Normal rate, regular rhythm and normal heart sounds.  No murmur heard.  Pulmonary/Chest: Effort normal and breath sounds normal. No respiratory distress. Musculoskeletal: Normal range of motion bilateral UE and LE, no joint effusions. Peripheral vascular: Bilateral LE no edema. Neurological: CN II-XII grossly intact with no focal deficits. Alert and oriented to person, place, and time. Coordination, balance, strength, speech and gait are normal.  Skin: Skin is warm and dry. Some hyperpigmented age spots on hands. No erythema.  Psychiatric: Patient has a normal mood and affect. Behavior is normal in office today. Judgment and thought content normal in office today.    Assessment & Plan  1. Allergic rhinitis with postnasal drip Stable findings, continue with current therapy.  2. Shortness of breath Clinically stable with no significant cardiac or pulmonary findings. Likely some age related deconditioning. She has declined any further PT therapy. - Vitamin B12  3. Left shoulder pain Trial of Tramadol.   4. Anxiety Refill  - ALPRAZolam (XANAX) 0.5 MG tablet; Take 1 tablet (0.5 mg total) by mouth 2 (two) times daily.  Dispense: 100 tablet; Refill: 1  5. Hypertensive kidney disease with chronic kidney disease stage III Blood work.  - Basic metabolic panel; Future - Urinalysis - Protein / creatinine ratio, urine  6. Chronic gouty arthropathy with tophus (tophi) Refill  - allopurinol (ZYLOPRIM) 100 MG tablet; Take 1 tablet (100 mg total) by mouth daily.  Dispense: 90 tablet; Refill: 2  7. HTN, goal below 150/90 Stable, Refill  -  lisinopril (PRINIVIL,ZESTRIL) 10 MG tablet; Take 1 tablet (10 mg total) by mouth daily.  Dispense: 90 tablet; Refill: 2  8. Mixed urge and stress incontinence Stable, refill  - oxybutynin (DITROPAN) 5 MG tablet; Take 1 tablet (5 mg total) by mouth 2 (two) times daily.  Dispense: 180 tablet; Refill: 2

## 2015-01-01 LAB — URINALYSIS
BILIRUBIN UA: NEGATIVE
GLUCOSE, UA: NEGATIVE
Ketones, UA: NEGATIVE
Leukocytes, UA: NEGATIVE
NITRITE UA: NEGATIVE
PROTEIN UA: NEGATIVE
RBC, UA: NEGATIVE
Specific Gravity, UA: 1.017 (ref 1.005–1.030)
Urobilinogen, Ur: 0.2 mg/dL (ref 0.2–1.0)
pH, UA: 6 (ref 5.0–7.5)

## 2015-01-01 LAB — VITAMIN B12: VITAMIN B 12: 303 pg/mL (ref 211–946)

## 2015-01-01 LAB — PROTEIN / CREATININE RATIO, URINE
CREATININE, UR: 66.2 mg/dL
Protein, Ur: 7.7 mg/dL
Protein/Creat Ratio: 116 mg/g creat (ref 0–200)

## 2015-01-03 NOTE — Addendum Note (Signed)
Addended by: Bobetta Lime on: 01/03/2015 11:40 AM   Modules accepted: Miquel Dunn

## 2015-01-05 ENCOUNTER — Telehealth: Payer: Self-pay | Admitting: Family Medicine

## 2015-01-05 DIAGNOSIS — N183 Chronic kidney disease, stage 3 unspecified: Secondary | ICD-10-CM

## 2015-01-05 DIAGNOSIS — I129 Hypertensive chronic kidney disease with stage 1 through stage 4 chronic kidney disease, or unspecified chronic kidney disease: Principal | ICD-10-CM

## 2015-01-05 NOTE — Telephone Encounter (Signed)
Please let Mrs Kimberly Abbott know that her most recent urine testing were within normal limits with no signs of urinary infection.  Unfortunately her blood work has not resulted due to technical error. If she is willing to come in and do her blood work again I can reorder her lab work OR she can wait until her next visit, there is no urgency.

## 2015-01-06 NOTE — Telephone Encounter (Signed)
Patient informed. 

## 2015-01-06 NOTE — Telephone Encounter (Signed)
Re-ordered the BMP and it is ready for patient to stop by and get lab drawn.

## 2015-01-06 NOTE — Telephone Encounter (Signed)
Per the request of Dr. Bobetta Lime, I contacted this patient to review her results from her recent labs and after she verified her date of birth, labs were reviewed. Patient was informed about the technical error and was asked if she would like to come in now for repeat labs or wait for her next visit. Patient stated that her next visit was too far away, so she will get with her daughter and see if she can bring her by the office to get the order. I told her that will be fine and that I will let Dr. Nadine Counts know.

## 2015-01-08 LAB — BASIC METABOLIC PANEL
BUN/Creatinine Ratio: 23 (ref 11–26)
BUN: 33 mg/dL (ref 10–36)
CO2: 25 mmol/L (ref 18–29)
Calcium: 9.8 mg/dL (ref 8.7–10.3)
Chloride: 103 mmol/L (ref 97–108)
Creatinine, Ser: 1.41 mg/dL — ABNORMAL HIGH (ref 0.57–1.00)
GFR calc Af Amer: 38 mL/min/{1.73_m2} — ABNORMAL LOW (ref >59)
GFR, EST NON AFRICAN AMERICAN: 33 mL/min/{1.73_m2} — AB (ref >59)
Glucose: 111 mg/dL — ABNORMAL HIGH (ref 65–99)
POTASSIUM: 4.5 mmol/L (ref 3.5–5.2)
Sodium: 142 mmol/L (ref 134–144)

## 2015-01-21 ENCOUNTER — Telehealth: Payer: Self-pay

## 2015-01-21 NOTE — Telephone Encounter (Signed)
Contacted [place], Patient's daughter, and advised her to proceed to the ER for symptoms, but she stated that she did not think it was that bad, so I told her that she was more than welcome to schedule an appt for next week. She said ok and that she will talk to her mom about it.

## 2015-01-21 NOTE — Telephone Encounter (Signed)
Daughter called and spoke with the call a nurse at 1:59pm today, stating her mother has been complaining of abdominal pain,. The message states no N/V/D. Afebrile. No other s/s to report. Patient has CPS, and has been taking narcotis for "years."  No traveling out of the country.  Mild constipation  Wants to know what to do.

## 2015-01-21 NOTE — Telephone Encounter (Signed)
I am not sure what CPS stands for. Kimberly Abbott has not complained of abdominal pain to me in the past but she has had a remote history of cystitis. Advise proceed to ER for evaluation.  The controlled substances that I have provided for Kimberly Abbott include alprazolam and tramadol in the past.

## 2015-02-10 DIAGNOSIS — R630 Anorexia: Secondary | ICD-10-CM | POA: Insufficient documentation

## 2015-02-10 DIAGNOSIS — N183 Chronic kidney disease, stage 3 unspecified: Secondary | ICD-10-CM | POA: Insufficient documentation

## 2015-02-10 DIAGNOSIS — R109 Unspecified abdominal pain: Secondary | ICD-10-CM | POA: Insufficient documentation

## 2015-02-11 ENCOUNTER — Encounter: Payer: Medicare Other | Admitting: Family Medicine

## 2015-02-24 DIAGNOSIS — R300 Dysuria: Secondary | ICD-10-CM | POA: Insufficient documentation

## 2015-02-24 DIAGNOSIS — R5383 Other fatigue: Secondary | ICD-10-CM | POA: Insufficient documentation

## 2015-03-28 ENCOUNTER — Ambulatory Visit: Payer: Medicare Other

## 2015-04-14 ENCOUNTER — Encounter: Payer: Self-pay | Admitting: Podiatry

## 2015-04-14 ENCOUNTER — Ambulatory Visit (INDEPENDENT_AMBULATORY_CARE_PROVIDER_SITE_OTHER): Payer: Medicare Other | Admitting: Podiatry

## 2015-04-14 DIAGNOSIS — B351 Tinea unguium: Secondary | ICD-10-CM

## 2015-04-14 DIAGNOSIS — M79676 Pain in unspecified toe(s): Secondary | ICD-10-CM

## 2015-04-14 NOTE — Progress Notes (Signed)
Patient ID: Kimberly Abbott, female   DOB: 1921-07-03, 81 y.o.   MRN: [id number]  Subjective: 81 y.o.-year-old female  returns the office today for painful, elongated, thickened toenails which are ingrowing especially the big toenails. She states the pain started to recur approximate 3 days ago. She denies any redness or drainage from the nail sites. Denies any acute changes since last appointment and no new complaints today. Denies any systemic complaints such as fevers, chills, nausea, vomiting.   Objective: AAO 3, NAD DP/PT pulses palpable, CRT less than 3 seconds Protective sensation intact with Simms Weinstein monofilament, Achilles tendon reflex intact.  Nails hypertrophic, dystrophic, elongated, brittle, discolored 10. There is evidence of ingrowing on all the nails especially bilateral hallux and 2nd digit toenails. There is tenderness overlying these nails. There is no surrounding erythema or drainage along the nail sites. No open lesions or pre-ulcerative lesions are identified. No other areas of tenderness bilateral lower extremities. No overlying edema, erythema, increased warmth. No pain with calf compression, swelling, warmth, erythema.  Assessment: Patient presents with symptomatic onychomycosis  Plan: -Treatment options including alternatives, risks, complications were discussed -Nails sharply debrided 10 without complication. Given the severity ingrown toenail on the right big toe there is a small amount of bleeding. Continue with [facility] and a Band-Aid. It does not give within 1-2 weeks to call the office or sooner if any problems are to arise. Monitor for any clinical signs or symptoms of infection and directed to call the office immediately should any occur or go to the ER. -Discussed daily foot inspection. If there are any changes, to call the office immediately.  -Follow-up in 3 months or sooner if any problems are to arise. In the meantime, encouraged to call the  office with any questions, concerns, changes symptoms.  Celesta Gentile, DPM

## 2015-05-05 DIAGNOSIS — L403 Pustulosis palmaris et plantaris: Secondary | ICD-10-CM | POA: Insufficient documentation

## 2015-07-14 ENCOUNTER — Ambulatory Visit (INDEPENDENT_AMBULATORY_CARE_PROVIDER_SITE_OTHER): Payer: Medicare Other | Admitting: Podiatry

## 2015-07-14 ENCOUNTER — Encounter: Payer: Self-pay | Admitting: Podiatry

## 2015-07-14 DIAGNOSIS — B351 Tinea unguium: Secondary | ICD-10-CM | POA: Diagnosis not present

## 2015-07-14 DIAGNOSIS — L309 Dermatitis, unspecified: Secondary | ICD-10-CM

## 2015-07-14 DIAGNOSIS — M79676 Pain in unspecified toe(s): Secondary | ICD-10-CM

## 2015-07-17 NOTE — Progress Notes (Signed)
Patient ID: Kimberly Abbott, female   DOB: Jan 21, 1921, 81 y.o.   MRN: [id number]  Subjective: 81 y.o.-year-old female  returns the office today for painful, elongated, thickened toenails which are ingrowing especially the big toenails. She denies any redness or drainage from the nail sites. Denies any acute changes since last appointment and no new complaints today. Denies any systemic complaints such as fevers, chills, nausea, vomiting.   Objective: AAO 3, NAD DP/PT pulses palpable, CRT less than 3 seconds Protective sensation intact with Simms Weinstein monofilament, Achilles tendon reflex intact.  Nails hypertrophic, dystrophic, elongated, brittle, discolored 10. There is continued evidence of ingrowing on all the nails especially bilateral hallux and 2nd digit toenails. There is tenderness overlying these nails 1-5 bilaterally. There is no surrounding erythema or drainage along the nail sites. No open lesions or pre-ulcerative lesions are identified. No other areas of tenderness bilateral lower extremities. No overlying edema, erythema, increased warmth. No pain with calf compression, swelling, warmth, erythema.  Assessment: Patient presents with symptomatic onychomycosis  Plan: -Treatment options including alternatives, risks, complications were discussed -Nails sharply debrided 10 without complication/bleeding. -Discussed daily foot inspection. If there are any changes, to call the office immediately.  -Follow-up in 3 months or sooner if any problems are to arise. In the meantime, encouraged to call the office with any questions, concerns, changes symptoms.  Celesta Gentile, DPM

## 2015-07-20 NOTE — Addendum Note (Signed)
Addended by: Cranford Mon R on: 07/20/2015 08:55 AM   Modules accepted: Orders

## 2015-08-10 ENCOUNTER — Other Ambulatory Visit: Payer: Self-pay | Admitting: Family Medicine

## 2015-08-11 ENCOUNTER — Telehealth: Payer: Self-pay | Admitting: *Deleted

## 2015-08-11 MED ORDER — DESOXIMETASONE 0.05 % EX CREA: TOPICAL_CREAM | Freq: Two times a day (BID) | CUTANEOUS | Status: DC

## 2015-08-11 NOTE — Telephone Encounter (Signed)
Dr. Jacqualyn Posey reviewed fungal culture 07/14/2015, as inconclusive and prescribed Topicort 0.05% apply to affected areas 2 times day.  Informed pt.

## 2015-08-23 DIAGNOSIS — I10 Essential (primary) hypertension: Secondary | ICD-10-CM | POA: Diagnosis not present

## 2015-08-23 DIAGNOSIS — N183 Chronic kidney disease, stage 3 (moderate): Secondary | ICD-10-CM | POA: Diagnosis not present

## 2015-08-23 DIAGNOSIS — R1312 Dysphagia, oropharyngeal phase: Secondary | ICD-10-CM | POA: Diagnosis not present

## 2015-08-23 DIAGNOSIS — Z6823 Body mass index (BMI) 23.0-23.9, adult: Secondary | ICD-10-CM | POA: Diagnosis not present

## 2015-08-23 DIAGNOSIS — R1310 Dysphagia, unspecified: Secondary | ICD-10-CM | POA: Diagnosis not present

## 2015-08-23 DIAGNOSIS — R5383 Other fatigue: Secondary | ICD-10-CM | POA: Diagnosis not present

## 2015-08-23 DIAGNOSIS — L403 Pustulosis palmaris et plantaris: Secondary | ICD-10-CM | POA: Diagnosis not present

## 2015-08-23 DIAGNOSIS — I129 Hypertensive chronic kidney disease with stage 1 through stage 4 chronic kidney disease, or unspecified chronic kidney disease: Secondary | ICD-10-CM | POA: Diagnosis not present

## 2015-09-01 DIAGNOSIS — M25511 Pain in right shoulder: Secondary | ICD-10-CM | POA: Diagnosis not present

## 2015-09-01 DIAGNOSIS — M7542 Impingement syndrome of left shoulder: Secondary | ICD-10-CM | POA: Diagnosis not present

## 2015-09-01 DIAGNOSIS — R5382 Chronic fatigue, unspecified: Secondary | ICD-10-CM | POA: Insufficient documentation

## 2015-09-01 DIAGNOSIS — N184 Chronic kidney disease, stage 4 (severe): Secondary | ICD-10-CM | POA: Diagnosis not present

## 2015-09-01 DIAGNOSIS — M25512 Pain in left shoulder: Secondary | ICD-10-CM | POA: Diagnosis not present

## 2015-09-01 DIAGNOSIS — D696 Thrombocytopenia, unspecified: Secondary | ICD-10-CM | POA: Insufficient documentation

## 2015-09-01 DIAGNOSIS — G8929 Other chronic pain: Secondary | ICD-10-CM | POA: Diagnosis not present

## 2015-09-08 DIAGNOSIS — Z87440 Personal history of urinary (tract) infections: Secondary | ICD-10-CM | POA: Diagnosis not present

## 2015-09-08 DIAGNOSIS — Z66 Do not resuscitate: Secondary | ICD-10-CM | POA: Diagnosis not present

## 2015-09-08 DIAGNOSIS — R21 Rash and other nonspecific skin eruption: Secondary | ICD-10-CM | POA: Diagnosis not present

## 2015-09-08 DIAGNOSIS — Z7982 Long term (current) use of aspirin: Secondary | ICD-10-CM | POA: Diagnosis not present

## 2015-09-08 DIAGNOSIS — Z6821 Body mass index (BMI) 21.0-21.9, adult: Secondary | ICD-10-CM | POA: Diagnosis not present

## 2015-09-08 DIAGNOSIS — N3281 Overactive bladder: Secondary | ICD-10-CM | POA: Diagnosis not present

## 2015-09-08 DIAGNOSIS — Z8249 Family history of ischemic heart disease and other diseases of the circulatory system: Secondary | ICD-10-CM | POA: Diagnosis not present

## 2015-09-08 DIAGNOSIS — J309 Allergic rhinitis, unspecified: Secondary | ICD-10-CM | POA: Diagnosis not present

## 2015-09-08 DIAGNOSIS — M1A9XX0 Chronic gout, unspecified, without tophus (tophi): Secondary | ICD-10-CM | POA: Diagnosis not present

## 2015-09-08 DIAGNOSIS — I10 Essential (primary) hypertension: Secondary | ICD-10-CM | POA: Diagnosis not present

## 2015-10-04 ENCOUNTER — Other Ambulatory Visit: Payer: Self-pay | Admitting: Family Medicine

## 2015-10-13 ENCOUNTER — Ambulatory Visit (INDEPENDENT_AMBULATORY_CARE_PROVIDER_SITE_OTHER): Payer: Medicare HMO | Admitting: Podiatry

## 2015-10-13 DIAGNOSIS — B351 Tinea unguium: Secondary | ICD-10-CM

## 2015-10-13 DIAGNOSIS — R21 Rash and other nonspecific skin eruption: Secondary | ICD-10-CM

## 2015-10-13 DIAGNOSIS — M79676 Pain in unspecified toe(s): Secondary | ICD-10-CM

## 2015-10-13 MED ORDER — KETOCONAZOLE 2 % EX CREA: 1.0000 "application " | TOPICAL_CREAM | Freq: Every day | CUTANEOUS | Status: DC

## 2015-10-13 NOTE — Progress Notes (Signed)
Patient ID: Kimberly Abbott, female   DOB: May 22, 1921, 82 y.o.   MRN: [id number]  Subjective: 82 y.o.-year-old female  returns the office today for painful, elongated, thickened toenails and for the nails continuing to ingrow. She denies any redness or drainage from the nail sites. Denies any acute changes since last appointment and no new complaints today. She is also osteophytic the prescription for the rash and lateral of her right foot. She has been seen multiple physicians and dermatologist for this. She does not have much relief. She is tried multiple creams. Denies any systemic complaints such as fevers, chills, nausea, vomiting.   Objective: AAO 3, NAD DP/PT pulses palpable, CRT less than 3 seconds Protective sensation intact with Simms Weinstein monofilament, Achilles tendon reflex intact.  Nails hypertrophic, dystrophic, elongated, brittle, discolored 10. There is continued evidence of ingrowing on all the nails especially bilateral hallux and 2nd digit toenails. There is tenderness overlying these nails 1-5 bilaterally. There is no surrounding erythema or drainage along the nail sites. On the plantar aspect of the right foot is dry, peeling skin with slight erythematous base. This appears to be almost Morven eczema versus a possible tinea infection. No open lesions or pre-ulcerative lesions are identified. No other areas of tenderness bilateral lower extremities. No overlying edema, erythema, increased warmth. No pain with calf compression, swelling, warmth, erythema.  Assessment: Patient presents with symptomatic onychomycosis; right foot skin lesion  Plan: -Treatment options including alternatives, risks, complications were discussed -Nails sharply debrided 10 without complication/bleeding. -History somewhat improved the Topicort however appears to be more of a fungus infection. We'll try daily, as well as see if this helps. If not we will go back to a steroid. -Discussed daily foot  inspection. If there are any changes, to call the office immediately.  -Follow-up in 3 months or sooner if any problems are to arise. In the meantime, encouraged to call the office with any questions, concerns, changes symptoms.  *Given her age of 82 and arthritis she cannot use regular pus service. She brought paperwork today for transportation and I filled this out for her.  Celesta Gentile, DPM

## 2015-11-30 DIAGNOSIS — R0602 Shortness of breath: Secondary | ICD-10-CM | POA: Diagnosis not present

## 2015-11-30 DIAGNOSIS — N3001 Acute cystitis with hematuria: Secondary | ICD-10-CM | POA: Diagnosis not present

## 2015-11-30 DIAGNOSIS — R1310 Dysphagia, unspecified: Secondary | ICD-10-CM | POA: Diagnosis not present

## 2015-11-30 DIAGNOSIS — I5030 Unspecified diastolic (congestive) heart failure: Secondary | ICD-10-CM | POA: Diagnosis not present

## 2015-11-30 DIAGNOSIS — Z6823 Body mass index (BMI) 23.0-23.9, adult: Secondary | ICD-10-CM | POA: Diagnosis not present

## 2015-11-30 DIAGNOSIS — R000 Tachycardia, unspecified: Secondary | ICD-10-CM | POA: Diagnosis not present

## 2015-11-30 DIAGNOSIS — R350 Frequency of micturition: Secondary | ICD-10-CM | POA: Diagnosis not present

## 2015-11-30 DIAGNOSIS — N3090 Cystitis, unspecified without hematuria: Secondary | ICD-10-CM | POA: Diagnosis not present

## 2015-11-30 DIAGNOSIS — E039 Hypothyroidism, unspecified: Secondary | ICD-10-CM | POA: Diagnosis not present

## 2015-11-30 DIAGNOSIS — R634 Abnormal weight loss: Secondary | ICD-10-CM | POA: Diagnosis not present

## 2015-11-30 DIAGNOSIS — I10 Essential (primary) hypertension: Secondary | ICD-10-CM | POA: Diagnosis not present

## 2015-11-30 DIAGNOSIS — I700 Atherosclerosis of aorta: Secondary | ICD-10-CM | POA: Diagnosis not present

## 2015-11-30 DIAGNOSIS — R0989 Other specified symptoms and signs involving the circulatory and respiratory systems: Secondary | ICD-10-CM | POA: Diagnosis not present

## 2015-12-23 DIAGNOSIS — R1310 Dysphagia, unspecified: Secondary | ICD-10-CM | POA: Diagnosis not present

## 2015-12-23 DIAGNOSIS — Z9181 History of falling: Secondary | ICD-10-CM | POA: Diagnosis not present

## 2015-12-23 DIAGNOSIS — K219 Gastro-esophageal reflux disease without esophagitis: Secondary | ICD-10-CM | POA: Diagnosis not present

## 2015-12-23 DIAGNOSIS — E7800 Pure hypercholesterolemia, unspecified: Secondary | ICD-10-CM | POA: Diagnosis not present

## 2015-12-23 DIAGNOSIS — I129 Hypertensive chronic kidney disease with stage 1 through stage 4 chronic kidney disease, or unspecified chronic kidney disease: Secondary | ICD-10-CM | POA: Diagnosis not present

## 2015-12-23 DIAGNOSIS — R0602 Shortness of breath: Secondary | ICD-10-CM | POA: Diagnosis not present

## 2015-12-23 DIAGNOSIS — N183 Chronic kidney disease, stage 3 (moderate): Secondary | ICD-10-CM | POA: Diagnosis not present

## 2015-12-23 DIAGNOSIS — N3001 Acute cystitis with hematuria: Secondary | ICD-10-CM | POA: Diagnosis not present

## 2015-12-23 DIAGNOSIS — Z87891 Personal history of nicotine dependence: Secondary | ICD-10-CM | POA: Diagnosis not present

## 2015-12-23 DIAGNOSIS — M109 Gout, unspecified: Secondary | ICD-10-CM | POA: Diagnosis not present

## 2015-12-29 ENCOUNTER — Other Ambulatory Visit: Payer: Self-pay

## 2015-12-29 DIAGNOSIS — N3946 Mixed incontinence: Secondary | ICD-10-CM

## 2015-12-29 NOTE — Telephone Encounter (Signed)
Error - duplicate

## 2015-12-30 MED ORDER — OXYBUTYNIN CHLORIDE 5 MG PO TABS: 5.0000 mg | ORAL_TABLET | Freq: Two times a day (BID) | ORAL | Status: DC

## 2016-01-19 ENCOUNTER — Encounter: Payer: Self-pay | Admitting: Podiatry

## 2016-01-19 ENCOUNTER — Ambulatory Visit (INDEPENDENT_AMBULATORY_CARE_PROVIDER_SITE_OTHER): Payer: Medicare HMO | Admitting: Podiatry

## 2016-01-19 DIAGNOSIS — M79676 Pain in unspecified toe(s): Secondary | ICD-10-CM | POA: Diagnosis not present

## 2016-01-19 DIAGNOSIS — R21 Rash and other nonspecific skin eruption: Secondary | ICD-10-CM

## 2016-01-19 DIAGNOSIS — B351 Tinea unguium: Secondary | ICD-10-CM

## 2016-01-19 MED ORDER — DESOXIMETASONE 0.05 % EX CREA: TOPICAL_CREAM | Freq: Two times a day (BID) | CUTANEOUS | Status: DC

## 2016-01-20 NOTE — Progress Notes (Signed)
Patient ID: Kimberly Abbott, female   DOB: [birth year], 82 y.o.   MRN: [id number]  Subjective: 82 y.o.-year-old female  returns the office today for painful, elongated, thickened toenails and for the nails continuing to ingrow. She denies any redness or drainage from the nail sites.  She is asking for a refill of the topicort as this has been helping the most for her right foot rash. She states that it is about gone.Denies any acute changes since last appointment and no new complaints today.  Denies any systemic complaints such as fevers, chills, nausea, vomiting.   Objective: AAO 3, NAD DP/PT pulses palpable, CRT less than 3 seconds Nails hypertrophic, dystrophic, elongated, brittle, discolored 10. There is tenderness overlying these nails 1-5 bilaterally. There is no surrounding erythema or drainage along the nail sites. On the plantar aspect of the right foot is dry, peeling skin with slight erythematous base however much improved. No drainage or pus today.  No other open lesions or pre-ulcerative lesions are identified. No other areas of tenderness bilateral lower extremities. No overlying edema, erythema, increased warmth. No pain with calf compression, swelling, warmth, erythema.  Assessment: Patient presents with symptomatic onychomycosis; right foot skin rash  Plan: -Treatment options including alternatives, risks, complications were discussed -Nails sharply debrided 10 without complication/bleeding. -Refilled topicort to use prn. Discussed not to use long term.  -Discussed daily foot inspection. If there are any changes, to call the office immediately.  -Follow-up in 3 months or sooner if any problems are to arise. In the meantime, encouraged to call the office with any questions, concerns, changes symptoms.  Celesta Gentile, DPM

## 2016-01-26 DIAGNOSIS — N183 Chronic kidney disease, stage 3 (moderate): Secondary | ICD-10-CM | POA: Diagnosis not present

## 2016-01-26 DIAGNOSIS — Z6823 Body mass index (BMI) 23.0-23.9, adult: Secondary | ICD-10-CM | POA: Diagnosis not present

## 2016-02-05 ENCOUNTER — Other Ambulatory Visit: Payer: Self-pay | Admitting: Family Medicine

## 2016-02-06 ENCOUNTER — Other Ambulatory Visit: Payer: Self-pay

## 2016-02-06 DIAGNOSIS — I10 Essential (primary) hypertension: Secondary | ICD-10-CM

## 2016-02-06 MED ORDER — LISINOPRIL 10 MG PO TABS: 10.0000 mg | ORAL_TABLET | Freq: Every day | ORAL | Status: DC

## 2016-02-06 NOTE — Telephone Encounter (Signed)
Note with Rx, appt please

## 2016-02-06 NOTE — Telephone Encounter (Signed)
Patient has not been seen in over a year Last labs are over a year old I really need to see the patient and have labs drawn to feel comfortable prescribing this medicine I'll approve a limited amount, but need to see her soon and get bloodwork; thank you

## 2016-02-07 NOTE — Telephone Encounter (Signed)
Patient will have daughter to call and make appointment.

## 2016-02-16 DIAGNOSIS — K219 Gastro-esophageal reflux disease without esophagitis: Secondary | ICD-10-CM | POA: Diagnosis not present

## 2016-02-16 DIAGNOSIS — N183 Chronic kidney disease, stage 3 (moderate): Secondary | ICD-10-CM | POA: Diagnosis not present

## 2016-02-16 DIAGNOSIS — R1310 Dysphagia, unspecified: Secondary | ICD-10-CM | POA: Diagnosis not present

## 2016-02-16 DIAGNOSIS — I10 Essential (primary) hypertension: Secondary | ICD-10-CM | POA: Diagnosis not present

## 2016-02-16 DIAGNOSIS — R0602 Shortness of breath: Secondary | ICD-10-CM | POA: Diagnosis not present

## 2016-02-16 DIAGNOSIS — Z6823 Body mass index (BMI) 23.0-23.9, adult: Secondary | ICD-10-CM | POA: Diagnosis not present

## 2016-02-28 DIAGNOSIS — K449 Diaphragmatic hernia without obstruction or gangrene: Secondary | ICD-10-CM | POA: Diagnosis not present

## 2016-02-28 DIAGNOSIS — Q399 Congenital malformation of esophagus, unspecified: Secondary | ICD-10-CM | POA: Diagnosis not present

## 2016-02-28 DIAGNOSIS — K222 Esophageal obstruction: Secondary | ICD-10-CM | POA: Diagnosis not present

## 2016-02-28 DIAGNOSIS — R1310 Dysphagia, unspecified: Secondary | ICD-10-CM | POA: Diagnosis not present

## 2016-03-02 DIAGNOSIS — M7542 Impingement syndrome of left shoulder: Secondary | ICD-10-CM | POA: Diagnosis not present

## 2016-03-02 DIAGNOSIS — M19041 Primary osteoarthritis, right hand: Secondary | ICD-10-CM | POA: Diagnosis not present

## 2016-03-02 DIAGNOSIS — N184 Chronic kidney disease, stage 4 (severe): Secondary | ICD-10-CM | POA: Diagnosis not present

## 2016-03-02 DIAGNOSIS — M19042 Primary osteoarthritis, left hand: Secondary | ICD-10-CM

## 2016-03-02 DIAGNOSIS — R52 Pain, unspecified: Secondary | ICD-10-CM | POA: Diagnosis not present

## 2016-03-02 DIAGNOSIS — D696 Thrombocytopenia, unspecified: Secondary | ICD-10-CM | POA: Diagnosis not present

## 2016-04-20 ENCOUNTER — Ambulatory Visit (INDEPENDENT_AMBULATORY_CARE_PROVIDER_SITE_OTHER): Payer: Medicare HMO | Admitting: Podiatry

## 2016-04-20 DIAGNOSIS — M2010 Hallux valgus (acquired), unspecified foot: Secondary | ICD-10-CM

## 2016-04-20 DIAGNOSIS — M79671 Pain in right foot: Secondary | ICD-10-CM | POA: Diagnosis not present

## 2016-04-20 DIAGNOSIS — M779 Enthesopathy, unspecified: Secondary | ICD-10-CM

## 2016-04-20 DIAGNOSIS — M21619 Bunion of unspecified foot: Secondary | ICD-10-CM | POA: Diagnosis not present

## 2016-04-20 DIAGNOSIS — M2141 Flat foot [pes planus] (acquired), right foot: Secondary | ICD-10-CM

## 2016-04-20 DIAGNOSIS — M79672 Pain in left foot: Secondary | ICD-10-CM

## 2016-04-20 DIAGNOSIS — M778 Other enthesopathies, not elsewhere classified: Secondary | ICD-10-CM

## 2016-04-20 DIAGNOSIS — B353 Tinea pedis: Secondary | ICD-10-CM

## 2016-04-20 DIAGNOSIS — M7750 Other enthesopathy of unspecified foot: Secondary | ICD-10-CM | POA: Diagnosis not present

## 2016-04-20 DIAGNOSIS — M2142 Flat foot [pes planus] (acquired), left foot: Secondary | ICD-10-CM | POA: Diagnosis not present

## 2016-04-20 MED ORDER — CLOTRIMAZOLE-BETAMETHASONE 1-0.05 % EX CREA: 1.0000 "application " | TOPICAL_CREAM | Freq: Two times a day (BID) | CUTANEOUS | 2 refills | Status: DC

## 2016-04-20 MED ORDER — TERBINAFINE HCL 250 MG PO TABS: 250.0000 mg | ORAL_TABLET | Freq: Every day | ORAL | 0 refills | Status: AC

## 2016-04-20 NOTE — Patient Instructions (Signed)
Foot Fungus Athlete's foot (tinea pedis) is a fungal infection of the skin on the feet. It often occurs on the skin between the toes or underneath the toes. It can also occur on the soles of the feet. Athlete's foot is more likely to occur in hot, humid weather. Not washing your feet or changing your socks often enough can contribute to athlete's foot. The infection can spread from person to person (contagious). CAUSES Athlete's foot is caused by a fungus. This fungus thrives in warm, moist places. Most people get athlete's foot by sharing shower stalls, towels, and wet floors with an infected person. People with weakened immune systems, including those with diabetes, may be more likely to get athlete's foot. SYMPTOMS   Itchy areas between the toes or on the soles of the feet.  White, flaky, or scaly areas between the toes or on the soles of the feet.  Tiny, intensely itchy blisters between the toes or on the soles of the feet.  Tiny cuts on the skin. These cuts can develop a bacterial infection.  Thick or discolored toenails. DIAGNOSIS  Your caregiver can usually tell what the problem is by doing a physical exam. Your caregiver may also take a skin sample from the rash area. The skin sample may be examined under a microscope, or it may be tested to see if fungus will grow in the sample. A sample may also be taken from your toenail for testing. TREATMENT  Over-the-counter and prescription medicines can be used to kill the fungus. These medicines are available as powders or creams. Your caregiver can suggest medicines for you. Fungal infections respond slowly to treatment. You may need to continue using your medicine for several weeks. PREVENTION   Do not share towels.  Wear sandals in wet areas, such as shared locker rooms and shared showers.  Keep your feet dry. Wear shoes that allow air to circulate. Wear cotton or wool socks. HOME CARE INSTRUCTIONS   Take medicines as directed by your  caregiver. Do not use steroid creams on athlete's foot.  Keep your feet clean and cool. Wash your feet daily and dry them thoroughly, especially between your toes.  Change your socks every day. Wear cotton or wool socks. In hot climates, you may need to change your socks 2 to 3 times per day.  Wear sandals or canvas tennis shoes with good air circulation.  If you have blisters, soak your feet in Burow's solution or Epsom salts for 20 to 30 minutes, 2 times a day to dry out the blisters. Make sure you dry your feet thoroughly afterward. SEEK MEDICAL CARE IF:   You have a fever.  You have swelling, soreness, warmth, or redness in your foot.  You are not getting better after 7 days of treatment.  You are not completely cured after 30 days.  You have any problems caused by your medicines. MAKE SURE YOU:   Understand these instructions.  Will watch your condition.  Will get help right away if you are not doing well or get worse.   This information is not intended to replace advice given to you by your health care provider. Make sure you discuss any questions you have with your health care provider.   Document Released: 07/13/2000 Document Revised: 10/08/2011 Document Reviewed: 01/17/2015 Elsevier Interactive Patient Education [employer].

## 2016-04-23 NOTE — Progress Notes (Signed)
Subjective: 82 year old female presents to office today for pain and tenderness to the right foot. Last encounter was in 01/19/2016 at which time dexamethasone cream was dispensed to patient. Patient states that her right foot has been itching for greater than 1 month now. Patient states that the dexamethasone cream does help however when she discontinues using the cream the itching comes back. Patient presents today for further treatment and evaluation   Objective: Physical Exam General: The patient is alert and oriented x3 in no acute distress.  Dermatology: Pruritic vesicular pustules noted to the plantar aspect of the right foot. Skin is mildly erythematous.   Skin is warm, dry and supple bilateral lower extremities. Negative for open lesions or macerations.  Vascular: Palpable pedal pulses bilaterally. No edema or erythema noted. Capillary refill within normal limits.  Neurological: Epicritic and protective threshold grossly intact bilaterally.   Musculoskeletal Exam: Pain on palpation to the first and second MPJs bilaterally. Patient demonstrates severe flatfoot deformity with hallux abductovalgus deformity bilaterally. Pain and tenderness upon range of motion of the first and second MPJs bilateral.  Range of motion limited to the first and second MPJs bilateral.Muscle strength 5/5 in all groups bilateral.   Assessment: #1 pustular tinea pedis right foot, likely secondary to T. Mentagrophytes.  #2. Pruritis right foot #3 pes planus deformity bilateral #4 hallux abductovalgus deformity bilateral #5 capsulitis first and second MPJ bilateral  #6 pain in bilateral feet  Problem List Items Addressed This Visit    None    Visit Diagnoses   None.       Plan of Care:  #1 Patient was evaluated. #2 Prescription for Lotrisone cream as well as terbinafine 250 mg by mouth 21 days was dispensed the patient today #3 today the patient was recommended custom molded orthotics to  alleviate foot pain. Patient is going to be scanned for the custom orthotics with offloading of the metatarsals, especially the first and second MPJs.. #4 patient is to return to the clinic in 3 weeks for follow-up evaluation and orthotics pickup     Dr. Edrick Kins, [place]

## 2016-05-01 DIAGNOSIS — R69 Illness, unspecified: Secondary | ICD-10-CM | POA: Diagnosis not present

## 2016-05-03 DIAGNOSIS — M19042 Primary osteoarthritis, left hand: Secondary | ICD-10-CM | POA: Diagnosis not present

## 2016-05-03 DIAGNOSIS — R1310 Dysphagia, unspecified: Secondary | ICD-10-CM | POA: Diagnosis not present

## 2016-05-03 DIAGNOSIS — N189 Chronic kidney disease, unspecified: Secondary | ICD-10-CM | POA: Diagnosis not present

## 2016-05-03 DIAGNOSIS — Z79899 Other long term (current) drug therapy: Secondary | ICD-10-CM | POA: Diagnosis not present

## 2016-05-03 DIAGNOSIS — I10 Essential (primary) hypertension: Secondary | ICD-10-CM | POA: Diagnosis not present

## 2016-05-03 DIAGNOSIS — M1990 Unspecified osteoarthritis, unspecified site: Secondary | ICD-10-CM | POA: Diagnosis not present

## 2016-05-03 DIAGNOSIS — G8929 Other chronic pain: Secondary | ICD-10-CM | POA: Diagnosis not present

## 2016-05-03 DIAGNOSIS — M19041 Primary osteoarthritis, right hand: Secondary | ICD-10-CM | POA: Insufficient documentation

## 2016-05-03 DIAGNOSIS — I129 Hypertensive chronic kidney disease with stage 1 through stage 4 chronic kidney disease, or unspecified chronic kidney disease: Secondary | ICD-10-CM | POA: Diagnosis not present

## 2016-05-03 DIAGNOSIS — M25512 Pain in left shoulder: Secondary | ICD-10-CM | POA: Diagnosis not present

## 2016-05-03 DIAGNOSIS — M25511 Pain in right shoulder: Secondary | ICD-10-CM | POA: Diagnosis not present

## 2016-05-03 DIAGNOSIS — R5383 Other fatigue: Secondary | ICD-10-CM | POA: Diagnosis not present

## 2016-05-07 ENCOUNTER — Telehealth: Payer: Self-pay | Admitting: *Deleted

## 2016-05-07 MED ORDER — DESOXIMETASONE 0.05 % EX CREA: TOPICAL_CREAM | Freq: Two times a day (BID) | CUTANEOUS | 0 refills | Status: DC

## 2016-05-07 NOTE — Telephone Encounter (Signed)
Received request for refill Desoximetas 0.05% Cream.  Dr. Berton Lan refill once, pt needs an appt if continuing to have problem.

## 2016-05-11 ENCOUNTER — Ambulatory Visit (INDEPENDENT_AMBULATORY_CARE_PROVIDER_SITE_OTHER): Payer: Medicare HMO | Admitting: Podiatry

## 2016-05-11 ENCOUNTER — Encounter: Payer: Self-pay | Admitting: Podiatry

## 2016-05-11 DIAGNOSIS — M2141 Flat foot [pes planus] (acquired), right foot: Secondary | ICD-10-CM | POA: Diagnosis not present

## 2016-05-11 DIAGNOSIS — M2142 Flat foot [pes planus] (acquired), left foot: Secondary | ICD-10-CM | POA: Diagnosis not present

## 2016-05-11 DIAGNOSIS — M79671 Pain in right foot: Secondary | ICD-10-CM

## 2016-05-11 DIAGNOSIS — M2010 Hallux valgus (acquired), unspecified foot: Secondary | ICD-10-CM | POA: Diagnosis not present

## 2016-05-11 DIAGNOSIS — B353 Tinea pedis: Secondary | ICD-10-CM

## 2016-05-11 DIAGNOSIS — M21619 Bunion of unspecified foot: Secondary | ICD-10-CM

## 2016-05-11 DIAGNOSIS — M79672 Pain in left foot: Secondary | ICD-10-CM

## 2016-05-11 NOTE — Patient Instructions (Signed)

## 2016-05-13 NOTE — Progress Notes (Signed)
Subjective: 82 year old female presents today for orthotics pickup. Upon evaluation and treatment is realized that the orthotics for one size too large and it would not fit into her shoes. Patient also continues to complain of pustular tinea pedis of the right foot likely secondary to T mentagrophytes. Patient states she did not take the Lamisil pills due to concern for all the possible side effects upon pickup the pharmacy.   Objective: Physical Exam General: The patient is alert and oriented x3 in no acute distress.  Dermatology: Pruritic vesicular pustules noted to the plantar aspect of the right foot. Skin is mildly erythematous.   Skin is warm, dry and supple bilateral lower extremities. Negative for open lesions or macerations.  Vascular: Palpable pedal pulses bilaterally. No edema or erythema noted. Capillary refill within normal limits.  Neurological: Epicritic and protective threshold grossly intact bilaterally.   Musculoskeletal Exam: Pain on palpation to the first and second MPJs bilaterally. Patient demonstrates severe flatfoot deformity with hallux abductovalgus deformity bilaterally. Pain and tenderness upon range of motion of the first and second MPJs bilateral.  Range of motion limited to the first and second MPJs bilateral.Muscle strength 5/5 in all groups bilateral.   Assessment: #1 pustular tinea pedis right foot, likely secondary to T. Mentagrophytes.  #2. Pruritis right foot #3 pes planus deformity bilateral #4 hallux abductovalgus deformity bilateral #5 capsulitis first and second MPJ bilateral  #6 pain in bilateral feet  Problem List Items Addressed This Visit    None    Visit Diagnoses   None.       Plan of Care:  #1 Patient was evaluated. #2 patient is to continue applying Lotrisone cream to tinea pedis. #3 recommend 4 weeks of terbinafine 250 mg daily #4 orthotics modifications were initiated today and orthotic setbacks the pharmacy. #5 patient is  to return to clinic for orthotics pickup    Dr. Edrick Kins, [place]

## 2016-05-22 ENCOUNTER — Ambulatory Visit (INDEPENDENT_AMBULATORY_CARE_PROVIDER_SITE_OTHER): Payer: Medicare HMO | Admitting: Podiatry

## 2016-05-22 DIAGNOSIS — M79672 Pain in left foot: Secondary | ICD-10-CM

## 2016-05-22 DIAGNOSIS — M2010 Hallux valgus (acquired), unspecified foot: Secondary | ICD-10-CM

## 2016-05-22 DIAGNOSIS — M21619 Bunion of unspecified foot: Secondary | ICD-10-CM

## 2016-05-22 DIAGNOSIS — M79671 Pain in right foot: Secondary | ICD-10-CM

## 2016-05-22 DIAGNOSIS — M778 Other enthesopathies, not elsewhere classified: Secondary | ICD-10-CM

## 2016-05-22 DIAGNOSIS — M7750 Other enthesopathy of unspecified foot: Secondary | ICD-10-CM

## 2016-05-22 DIAGNOSIS — M779 Enthesopathy, unspecified: Secondary | ICD-10-CM

## 2016-05-22 NOTE — Progress Notes (Signed)
Patient ID: Kimberly Abbott, female   DOB: 1920-11-25, 82 y.o.   MRN: [id number]  Patient presents for orthotic pick up.  Verbal and written break in and wear instructions given.  Patient will follow up in 4 weeks if symptoms worsen or fail to improve.

## 2016-05-22 NOTE — Patient Instructions (Signed)

## 2016-05-23 ENCOUNTER — Encounter: Payer: Self-pay | Admitting: Podiatry

## 2016-06-08 ENCOUNTER — Observation Stay
Admission: EM | Admit: 2016-06-08 | Discharge: 2016-06-10 | Disposition: A | Discharge status: Home / Self Care | Payer: Medicare HMO | Attending: Internal Medicine | Admitting: Internal Medicine

## 2016-06-08 ENCOUNTER — Encounter: Payer: Self-pay | Admitting: Emergency Medicine

## 2016-06-08 ENCOUNTER — Inpatient Hospital Stay: Payer: Medicare HMO

## 2016-06-08 ENCOUNTER — Emergency Department: Payer: Medicare HMO

## 2016-06-08 ENCOUNTER — Observation Stay: Payer: Medicare HMO

## 2016-06-08 ENCOUNTER — Observation Stay: Admit: 2016-06-08 | Payer: Medicare HMO

## 2016-06-08 DIAGNOSIS — R079 Chest pain, unspecified: Secondary | ICD-10-CM | POA: Diagnosis not present

## 2016-06-08 DIAGNOSIS — R0602 Shortness of breath: Secondary | ICD-10-CM

## 2016-06-08 DIAGNOSIS — I700 Atherosclerosis of aorta: Secondary | ICD-10-CM | POA: Diagnosis not present

## 2016-06-08 DIAGNOSIS — I639 Cerebral infarction, unspecified: Secondary | ICD-10-CM | POA: Diagnosis not present

## 2016-06-08 DIAGNOSIS — K220 Achalasia of cardia: Secondary | ICD-10-CM | POA: Diagnosis not present

## 2016-06-08 DIAGNOSIS — I6521 Occlusion and stenosis of right carotid artery: Secondary | ICD-10-CM | POA: Insufficient documentation

## 2016-06-08 DIAGNOSIS — I63233 Cerebral infarction due to unspecified occlusion or stenosis of bilateral carotid arteries: Secondary | ICD-10-CM | POA: Diagnosis not present

## 2016-06-08 DIAGNOSIS — R42 Dizziness and giddiness: Principal | ICD-10-CM

## 2016-06-08 DIAGNOSIS — I081 Rheumatic disorders of both mitral and tricuspid valves: Secondary | ICD-10-CM | POA: Insufficient documentation

## 2016-06-08 DIAGNOSIS — Z8249 Family history of ischemic heart disease and other diseases of the circulatory system: Secondary | ICD-10-CM | POA: Diagnosis not present

## 2016-06-08 DIAGNOSIS — Z7982 Long term (current) use of aspirin: Secondary | ICD-10-CM | POA: Insufficient documentation

## 2016-06-08 DIAGNOSIS — R9431 Abnormal electrocardiogram [ECG] [EKG]: Secondary | ICD-10-CM | POA: Diagnosis not present

## 2016-06-08 DIAGNOSIS — E785 Hyperlipidemia, unspecified: Secondary | ICD-10-CM | POA: Insufficient documentation

## 2016-06-08 DIAGNOSIS — K219 Gastro-esophageal reflux disease without esophagitis: Secondary | ICD-10-CM | POA: Insufficient documentation

## 2016-06-08 DIAGNOSIS — I129 Hypertensive chronic kidney disease with stage 1 through stage 4 chronic kidney disease, or unspecified chronic kidney disease: Secondary | ICD-10-CM | POA: Diagnosis not present

## 2016-06-08 DIAGNOSIS — R1310 Dysphagia, unspecified: Secondary | ICD-10-CM | POA: Diagnosis not present

## 2016-06-08 DIAGNOSIS — N189 Chronic kidney disease, unspecified: Secondary | ICD-10-CM | POA: Diagnosis not present

## 2016-06-08 DIAGNOSIS — R55 Syncope and collapse: Secondary | ICD-10-CM | POA: Diagnosis not present

## 2016-06-08 DIAGNOSIS — R51 Headache: Secondary | ICD-10-CM | POA: Diagnosis not present

## 2016-06-08 DIAGNOSIS — Z880 Allergy status to penicillin: Secondary | ICD-10-CM | POA: Insufficient documentation

## 2016-06-08 DIAGNOSIS — N390 Urinary tract infection, site not specified: Secondary | ICD-10-CM | POA: Insufficient documentation

## 2016-06-08 DIAGNOSIS — I249 Acute ischemic heart disease, unspecified: Secondary | ICD-10-CM | POA: Diagnosis not present

## 2016-06-08 DIAGNOSIS — R0789 Other chest pain: Secondary | ICD-10-CM | POA: Diagnosis not present

## 2016-06-08 DIAGNOSIS — R262 Difficulty in walking, not elsewhere classified: Secondary | ICD-10-CM

## 2016-06-08 DIAGNOSIS — I10 Essential (primary) hypertension: Secondary | ICD-10-CM | POA: Diagnosis not present

## 2016-06-08 HISTORY — DX: Gastro-esophageal reflux disease without esophagitis: K21.9

## 2016-06-08 LAB — BASIC METABOLIC PANEL
ANION GAP: 6 (ref 5–15)
BUN: 39 mg/dL — ABNORMAL HIGH (ref 6–20)
CALCIUM: 10 mg/dL (ref 8.9–10.3)
CHLORIDE: 106 mmol/L (ref 101–111)
CO2: 27 mmol/L (ref 22–32)
Creatinine, Ser: 1.21 mg/dL — ABNORMAL HIGH (ref 0.44–1.00)
GFR calc non Af Amer: 37 mL/min — ABNORMAL LOW (ref >60)
GFR, EST AFRICAN AMERICAN: 43 mL/min — AB (ref >60)
GLUCOSE: 100 mg/dL — AB (ref 65–99)
Potassium: 4.5 mmol/L (ref 3.5–5.1)
Sodium: 139 mmol/L (ref 135–145)

## 2016-06-08 LAB — CBC
HEMATOCRIT: 38.6 % (ref 35.0–47.0)
HEMOGLOBIN: 12.8 g/dL (ref 12.0–16.0)
MCH: 29.4 pg (ref 26.0–34.0)
MCHC: 33.3 g/dL (ref 32.0–36.0)
MCV: 88.3 fL (ref 80.0–100.0)
Platelets: 221 10*3/uL (ref 150–440)
RBC: 4.37 MIL/uL (ref 3.80–5.20)
RDW: 15.2 % — ABNORMAL HIGH (ref 11.5–14.5)
WBC: 6.6 10*3/uL (ref 3.6–11.0)

## 2016-06-08 LAB — TROPONIN I
TROPONIN I: 0.03 ng/mL — AB (ref <0.03)
TROPONIN I: 0.03 ng/mL — AB (ref <0.03)

## 2016-06-08 MED ORDER — MECLIZINE HCL 12.5 MG PO TABS
12.5000 mg | ORAL_TABLET | Freq: Three times a day (TID) | ORAL | Status: DC | PRN
  Filled 2016-06-08: qty 1

## 2016-06-08 MED ORDER — HEPARIN SODIUM (PORCINE) 5000 UNIT/ML IJ SOLN
5000.0000 [IU] | Freq: Three times a day (TID) | INTRAMUSCULAR | Status: DC
  Administered 2016-06-09 – 2016-06-10 (×3): 5000 [IU] via SUBCUTANEOUS
  Filled 2016-06-08 (×3): qty 1

## 2016-06-08 MED ORDER — ENSURE ENLIVE PO LIQD
237.0000 mL | Freq: Two times a day (BID) | ORAL | Status: DC
  Administered 2016-06-08 – 2016-06-10 (×4): 237 mL via ORAL

## 2016-06-08 MED ORDER — DOCUSATE SODIUM 100 MG PO CAPS
100.0000 mg | ORAL_CAPSULE | Freq: Two times a day (BID) | ORAL | Status: DC
  Administered 2016-06-08: 100 mg via ORAL
  Filled 2016-06-08 (×2): qty 1

## 2016-06-08 MED ORDER — ASPIRIN 81 MG PO CHEW
324.0000 mg | CHEWABLE_TABLET | Freq: Once | ORAL | Status: DC
  Filled 2016-06-08: qty 4

## 2016-06-08 MED ORDER — ENOXAPARIN SODIUM 40 MG/0.4ML ~~LOC~~ SOLN
40.0000 mg | SUBCUTANEOUS | Status: DC
  Administered 2016-06-08: 40 mg via SUBCUTANEOUS

## 2016-06-08 MED ORDER — ALLOPURINOL 100 MG PO TABS
100.0000 mg | ORAL_TABLET | Freq: Every day | ORAL | Status: DC
  Administered 2016-06-08 – 2016-06-10 (×2): 100 mg via ORAL
  Filled 2016-06-08 (×3): qty 1

## 2016-06-08 MED ORDER — ACETAMINOPHEN 325 MG PO TABS: 650.0000 mg | ORAL_TABLET | Freq: Four times a day (QID) | ORAL | Status: DC | PRN

## 2016-06-08 MED ORDER — ASPIRIN 300 MG RE SUPP
300.0000 mg | Freq: Once | RECTAL | Status: AC
  Administered 2016-06-08: 300 mg via RECTAL
  Filled 2016-06-08: qty 1

## 2016-06-08 MED ORDER — VITAMIN D 1000 UNITS PO TABS
2000.0000 [IU] | ORAL_TABLET | Freq: Every day | ORAL | Status: DC
  Administered 2016-06-08 – 2016-06-10 (×2): 2000 [IU] via ORAL
  Filled 2016-06-08 (×3): qty 2

## 2016-06-08 MED ORDER — ISOSORBIDE MONONITRATE ER 30 MG PO TB24
30.0000 mg | ORAL_TABLET | Freq: Every day | ORAL | Status: DC
  Administered 2016-06-08 – 2016-06-10 (×2): 30 mg via ORAL
  Filled 2016-06-08 (×3): qty 1

## 2016-06-08 MED ORDER — LORATADINE 10 MG PO TABS
10.0000 mg | ORAL_TABLET | Freq: Every day | ORAL | Status: DC
  Administered 2016-06-08 – 2016-06-10 (×2): 10 mg via ORAL
  Filled 2016-06-08 (×3): qty 1

## 2016-06-08 MED ORDER — ONDANSETRON HCL 4 MG/2ML IJ SOLN: 4.0000 mg | Freq: Four times a day (QID) | INTRAMUSCULAR | Status: DC | PRN

## 2016-06-08 MED ORDER — LISINOPRIL 10 MG PO TABS
10.0000 mg | ORAL_TABLET | Freq: Every day | ORAL | Status: DC
  Administered 2016-06-08 – 2016-06-10 (×2): 10 mg via ORAL
  Filled 2016-06-08 (×3): qty 1

## 2016-06-08 MED ORDER — ACETAMINOPHEN 650 MG RE SUPP: 650.0000 mg | Freq: Four times a day (QID) | RECTAL | Status: DC | PRN

## 2016-06-08 MED ORDER — TRAMADOL HCL 50 MG PO TABS
50.0000 mg | ORAL_TABLET | Freq: Four times a day (QID) | ORAL | Status: DC | PRN
  Filled 2016-06-08: qty 1

## 2016-06-08 MED ORDER — OXYBUTYNIN CHLORIDE 5 MG PO TABS
5.0000 mg | ORAL_TABLET | Freq: Two times a day (BID) | ORAL | Status: DC
  Administered 2016-06-08 – 2016-06-10 (×4): 5 mg via ORAL
  Filled 2016-06-08 (×6): qty 1

## 2016-06-08 MED ORDER — ASPIRIN 81 MG PO CHEW
81.0000 mg | CHEWABLE_TABLET | Freq: Every day | ORAL | Status: DC
  Administered 2016-06-10: 81 mg via ORAL
  Filled 2016-06-08 (×3): qty 1

## 2016-06-08 MED ORDER — ONDANSETRON HCL 4 MG PO TABS: 4.0000 mg | ORAL_TABLET | Freq: Four times a day (QID) | ORAL | Status: DC | PRN

## 2016-06-08 NOTE — ED Provider Notes (Signed)
[facility] Emergency Department Provider Note  ____________________________________________  Time seen: Approximately 7:59 AM  I have reviewed the triage vital signs and the nursing notes.   HISTORY  Chief Complaint Shortness of Breath   HPI Kimberly Abbott is a 82 y.o. female h/o CKD, GERD, esophageal stricture, hypertension, and hyperlipidemia who presents for evaluation of shortness of breath and dizziness. Patient reports that yesterday she had an episode of lightheadedness. She then felt better and went to bed. When she woke up at 10 PM she started feeling room spinning and also dizzy like she was going to pass out. She was unable to sleep throughout the night. This morning she pulled her alarm button and one of her neighbors came to check on her. The neighbor found her sitting on her chair complaining that she felt dizzy, she was pale and diaphoretic, she look like she was having difficulty breathing so the paramedics were called. Patient continues to endorse shortness of breath that has been present throughout the night which is atypical for her. She denies having any chest pain. She denies facial droop, weakness or numbness in one of her extremities, dysarthria, diplopia. She does endorse mild difficulty swallowing but she says this is old for her. Patient has never had a stroke, no personal or family history of ischemic heart disease. No abdominal pain, no nausea, no vomiting, no fever, no chills, no back pain.  Past Medical History:  Diagnosis Date  . Allergy   . CKD (chronic kidney disease)   . GERD (gastroesophageal reflux disease)   . Hyperlipidemia   . Hypertension     Patient Active Problem List   Diagnosis Date Noted  . Abnormal EKG 06/08/2016  . Allergic rhinitis 12/31/2014  . Anxiety 12/31/2014  . Chronic gouty arthropathy with tophus (tophi) 12/31/2014  . Dermatitis, eczematoid 12/31/2014  . Heart & renal disease, hypertensive, with heart  failure ([place]) 12/31/2014  . Routine general medical examination at a health care facility 12/31/2014  . Mixed incontinence 12/31/2014  . Chronic urinary tract infection 12/31/2014  . Left shoulder pain 12/31/2014  . Hypercholesteremia 11/08/2014  . Shortness of breath at rest 11/08/2014  . Anemia 11/08/2014  . Contact dermatitis 11/08/2014  . Urinary tract infection 11/08/2014  . Depression 11/08/2014  . Hypertensive kidney disease with chronic kidney disease stage III 11/08/2014  . Allergic rhinitis, mild 11/08/2014  . Gastroesophageal reflux disease without esophagitis 11/08/2014  . Mixed urge and stress incontinence 11/08/2014  . At moderate risk for fall 11/08/2014  . Esophageal dysphagia 11/08/2014  . Rotator cuff syndrome of right shoulder 11/08/2014  . Dermatosis 11/08/2014  . Acute anxiety 11/08/2014  . 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 Protein-calorie malnutrition ([place]) 11/08/2014  . Neck pain, chronic 11/08/2014  . HTN, goal below 150/90 11/08/2014  . Arthritis 11/08/2014    Past Surgical History:  Procedure Laterality Date  . APPENDECTOMY    . rectal fistula  in the 40's  . TUBAL LIGATION      Prior to Admission medications   Medication Sig Start Date End Date Taking? Authorizing Provider  allopurinol (ZYLOPRIM) 100 MG tablet Take 100 mg by mouth daily.    Yes Historical Provider, MD  aspirin 81 MG chewable tablet Chew 1 tablet by mouth daily.   Yes Historical Provider, MD  cetirizine (ZYRTEC) 10 MG tablet Take 10 mg by mouth.   Yes Historical Provider, MD  Cholecalciferol (VITAMIN D) 2000 UNITS tablet Take 2,000 Units  by mouth daily.    Yes Historical Provider, MD  clobetasol cream (TEMOVATE) [id number] % Apply 1 application topically 2 (two) times daily as needed. 08/04/14  Yes Historical Provider, MD  Cranberry ([facility] CRANBERRY) 500 MG CAPS Take 1 capsule by mouth daily.    Yes Historical Provider, MD  desoximetasone (TOPICORT) 0.05 % cream Apply  topically 2 (two) times daily. 05/07/16  Yes Trula Slade, DPM  ketoconazole (NIZORAL) 2 % cream Apply 1 application topically daily. 10/13/15  Yes Trula Slade, DPM  lisinopril (PRINIVIL,ZESTRIL) 10 MG tablet Take 1 tablet (10 mg total) by mouth daily. (appt and labs needed please) 02/06/16  Yes Arnetha Courser, MD  Naftifine HCl (NAFTIN) 2 % CREA Apply 1 application topically 1 day or 1 dose. 10/21/14  Yes Trula Slade, DPM  oxybutynin (DITROPAN) 5 MG tablet Take 1 tablet (5 mg total) by mouth 2 (two) times daily. appt please 02/06/16  Yes Arnetha Courser, MD    Allergies Penicillin g and Penicillins  No family history on file.  Social History Social History  Substance Use Topics  . Smoking status: Never Smoker  . Smokeless tobacco: Never Used  . Alcohol use No    Review of Systems  Constitutional: Negative for fever. + Lightheadedness Eyes: Negative for visual changes. ENT: Negative for sore throat. Cardiovascular: Negative for chest pain. Respiratory: + shortness of breath. Gastrointestinal: Negative for abdominal pain, vomiting or diarrhea. Genitourinary: Negative for dysuria. Musculoskeletal: Negative for back pain. Skin: Negative for rash. Neurological: Negative for headaches, weakness or numbness. + Vertigo   ____________________________________________   PHYSICAL EXAM:  VITAL SIGNS: ED Triage Vitals  Enc Vitals Group     BP 06/08/16 0731 (!) 175/93     Pulse Rate 06/08/16 0731 83     Resp 06/08/16 0731 18     Temp 06/08/16 0731 97.5 F (36.4 C)     Temp Source 06/08/16 0731 Oral     SpO2 06/08/16 0731 100 %     Weight 06/08/16 0732 119 lb (54 kg)     Height 06/08/16 0732 5\' 4"  (1.626 m)     Head Circumference --      Peak Flow --      Pain Score --      Pain Loc --      Pain Edu? --      Excl. in [place]? --     Constitutional: Alert and oriented. Well appearing and in no apparent distress. HEENT:      Head: Normocephalic and atraumatic.          Eyes: Conjunctivae are normal. Sclera is non-icteric. EOMI. PERRL      Mouth/Throat: Mucous membranes are moist.       Neck: Supple with no signs of meningismus. Cardiovascular: Regular rate and rhythm. No murmurs, gallops, or rubs. 2+ symmetrical distal pulses are present in all extremities. No JVD. Respiratory: Normal respiratory effort. Lungs are clear to auscultation bilaterally. No wheezes, crackles, or rhonchi.  Gastrointestinal: Soft, non tender, and non distended with positive bowel sounds. No rebound or guarding. Musculoskeletal: Nontender with normal range of motion in all extremities. No edema, cyanosis, or erythema of extremities. Neurologic: Normal speech and language. A & O x3, PERRL, no nystagmus, CN II-XII intact, motor testing reveals good tone and bulk throughout. There is no evidence of pronator drift or dysmetria. Muscle strength is 5/5 throughout. Deep tendon reflexes are 2+ throughout with downgoing toes. Sensory examination is intact. Gait is normal.  Skin: Skin is warm, dry and intact. No rash noted. Psychiatric: Mood and affect are normal. Speech and behavior are normal.  ____________________________________________   LABS (all labs ordered are listed, but only abnormal results are displayed)  Labs Reviewed  BASIC METABOLIC PANEL - Abnormal; Notable for the following:       Result Value   Glucose, Bld 100 (*)    BUN 39 (*)    Creatinine, Ser 1.21 (*)    GFR calc non Af Amer 37 (*)    GFR calc Af Amer 43 (*)    All other components within normal limits  CBC - Abnormal; Notable for the following:    RDW 15.2 (*)    All other components within normal limits  TROPONIN I  URINALYSIS COMPLETEWITH MICROSCOPIC ([facility] ONLY)   ____________________________________________  EKG  ED ECG REPORT I, Rudene Re, the attending physician, personally viewed and interpreted this ECG.  Sinus rhythm, rate of 66, first-degree AV block, normal QTC and QRS intervals,  normal axis, biphasic T-wave in V2 and V3 and V4 and T-wave inversions on inferior and lateral leads which are all new from prior from 2015.  ____________________________________________  RADIOLOGY  CXR: Evidence of prior granulomatous disease. No edema or consolidation. Aortic atherosclerosis present.  Head CT: No acute intracranial abnormality. No definite acute cortical infarction. Mild cerebral atrophy. Periventricular and patchy subcortical white matter decreased attenuation probable due to chronic small vessel ischemic changes. No definite acute cortical infarction. ____________________________________________   PROCEDURES  Procedure(s) performed: None Procedures Critical Care performed: yes  CRITICAL CARE Performed by: Rudene Re  ?  Total critical care time: 35 min  Critical care time was exclusive of separately billable procedures and treating other patients.  Critical care was necessary to treat or prevent imminent or life-threatening deterioration.  Critical care was time spent personally by me on the following activities: development of treatment plan with patient and/or surrogate as well as nursing, discussions with consultants, evaluation of patient's response to treatment, examination of patient, obtaining history from patient or surrogate, ordering and performing treatments and interventions, ordering and review of laboratory studies, ordering and review of radiographic studies, pulse oximetry and re-evaluation of patient's condition.  ____________________________________________   INITIAL IMPRESSION / ASSESSMENT AND PLAN / ED COURSE  82 y.o. female h/o CKD, GERD, esophageal stricture, hypertension, and hyperlipidemia who presents for evaluation of shortness of breath and dizziness. Patient with EKG concerning for ischemia with biphasic T waves on V2 to V4 and T-wave inversions on inferior and lateral leads. Patient does have dizziness and shortness of  breath with minimal exertion, lungs are clear to auscultation, she is neurologically intact otherwise. We'll discuss EKG with cardiology. Pacer pads placed on patient, telemetry, blood work, will also get head CT to rule out stroke. Patient outside tPA window. Discussed with Dr. Humphrey Rolls, cardiology who agrees that EKG is concerning for ischemia however not meeting STEMI criteria. He will come and evaluate patient at this time.   Clinical Course as of Jun 08 1018  Fri Jun 08, 2016  [id number] First troponin is negative. Dr. Humphrey Rolls discussed with patient and her family risks and benefits of cardiac catheterization with her new EKG changes. Patient has refused catheterization. He recommended starting patient on heparin and get her admitted for an echocardiogram and further evaluation and medical management. At this time will pursue with a head CT to rule out intracranial cause of her symptoms and if that is negative we'll get patient admitted to the hospitalist  service.  [CV]  [id number] Head CT negative. Will admit to Hospitalist. Patient continues to endorse dysphagia. Patient will need MRI to rule out stroke and cardiac eval.   [CV]    Clinical Course User Index [CV] Rudene Re, MD    Pertinent labs & imaging results that were available during my care of the patient were reviewed by me and considered in my medical decision making (see chart for details).    ____________________________________________   FINAL CLINICAL IMPRESSION(S) / ED DIAGNOSES  Final diagnoses:  Shortness of breath  Vertigo  Dysphagia, unspecified type  Abnormal EKG      NEW MEDICATIONS STARTED DURING THIS VISIT:  New Prescriptions   No medications on file     Note:  This document was prepared using Dragon voice recognition software and may include unintentional dictation errors.    Rudene Re, MD 06/08/16 1019

## 2016-06-08 NOTE — ED Notes (Signed)
Pt placed on defib pads per MD request. Family at bedside. Pt ambulated with minimal assist across room for MD evaluation. Pt placed back in bed. Will continue to monitor.

## 2016-06-08 NOTE — Care Management CC44 (Signed)
Condition Code 44 Documentation Completed  Patient Details  Name: Kimberly Abbott MRN: [id number] Date of Birth: [birth year]   Condition Code 44 given:   yes Patient signature on Condition Code 44 notice:    yes Documentation of 2 MD's agreement:   yes Code 44 added to claim:    per C Lou Cal, Renetta Chalk, RN 06/08/2016, 12:58 PM

## 2016-06-08 NOTE — H&P (Signed)
[facility] NAME: Kimberly Abbott    MR#:  [id number]  DATE OF BIRTH:  1921-03-03  DATE OF ADMISSION:  06/08/2016  PRIMARY CARE PHYSICIAN: Hoover Brunette, MD   REQUESTING/REFERRING PHYSICIAN: dr Alfred Levins  CHIEF COMPLAINT:  Sudden onset dizziness on getting up from bed along with weakness  HISTORY OF PRESENT ILLNESS:  Kimberly Abbott  is a 82 y.o. female with a known  h/o CKD, GERD, esophageal stricture, hypertension, and hyperlipidemia who presents for evaluation of shortness of breath and dizziness. Patient reports that yesterday she had an episode of lightheadedness. She then felt better and went to bed. When she woke up at 10 PM she started feeling room spinning and also dizzy like she was going to pass out. Patient never lost consciousness. No seizures noted. She was seen in the emergency room she remained hemodynamically stable. Denied any chest pain shortness of breath. Denies any focal weakness. Patient denies any nausea or vomiting. Her symptoms are mainly related to positional vertigo. Denies any recent ear infection URI or any sinus congestion. She does have chronic postnasal drip.  Given her symptoms patient was admitted for further evaluation management. She was seen by cardiology in the emergency room.  PAST MEDICAL HISTORY:   Past Medical History:  Diagnosis Date  . Allergy   . CKD (chronic kidney disease)   . GERD (gastroesophageal reflux disease)   . Hyperlipidemia   . Hypertension     PAST SURGICAL HISTOIRY:   Past Surgical History:  Procedure Laterality Date  . APPENDECTOMY    . rectal fistula  in the 40's  . TUBAL LIGATION      SOCIAL HISTORY:   Social History  Substance Use Topics  . Smoking status: Never Smoker  . Smokeless tobacco: Never Used  . Alcohol use No    FAMILY HISTORY:  HTN  DRUG ALLERGIES:   Allergies  Allergen Reactions  . Penicillin G Hives  . Penicillins Hives     Has patient had a PCN reaction causing immediate rash, facial/tongue/throat swelling, SOB or lightheadedness with hypotension: No Has patient had a PCN reaction causing severe rash involving mucus membranes or skin necrosis: No Has patient had a PCN reaction that required hospitalization No Has patient had a PCN reaction occurring within the last 10 years: No If all of the above answers are "NO", then may proceed with Cephalosporin use.    REVIEW OF SYSTEMS:  Review of Systems  Constitutional: Negative for chills, fever and weight loss.  HENT: Negative for ear discharge, ear pain and nosebleeds.   Eyes: Negative for blurred vision, pain and discharge.  Respiratory: Negative for sputum production, shortness of breath, wheezing and stridor.   Cardiovascular: Negative for chest pain, palpitations, orthopnea and PND.  Gastrointestinal: Negative for abdominal pain, diarrhea, nausea and vomiting.  Genitourinary: Negative for frequency and urgency.  Musculoskeletal: Negative for back pain and joint pain.  Neurological: Positive for dizziness and weakness. Negative for sensory change, speech change and focal weakness.  Psychiatric/Behavioral: Negative for depression and hallucinations. The patient is not nervous/anxious.      MEDICATIONS AT HOME:   Prior to Admission medications   Medication Sig Start Date End Date Taking? Authorizing Provider  allopurinol (ZYLOPRIM) 100 MG tablet Take 100 mg by mouth daily.    Yes Historical Provider, MD  aspirin 81 MG chewable tablet Chew 1 tablet by mouth daily.   Yes Historical Provider, MD  cetirizine (ZYRTEC) 10  MG tablet Take 10 mg by mouth.   Yes Historical Provider, MD  Cholecalciferol (VITAMIN D) 2000 UNITS tablet Take 2,000 Units by mouth daily.    Yes Historical Provider, MD  clobetasol cream (TEMOVATE) [id number] % Apply 1 application topically 2 (two) times daily as needed. 08/04/14  Yes Historical Provider, MD  Cranberry ([employer] CRANBERRY) 500 MG CAPS Take  1 capsule by mouth daily.    Yes Historical Provider, MD  desoximetasone (TOPICORT) 0.05 % cream Apply topically 2 (two) times daily. 05/07/16  Yes Trula Slade, DPM  ketoconazole (NIZORAL) 2 % cream Apply 1 application topically daily. 10/13/15  Yes Trula Slade, DPM  lisinopril (PRINIVIL,ZESTRIL) 10 MG tablet Take 1 tablet (10 mg total) by mouth daily. (appt and labs needed please) 02/06/16  Yes Arnetha Courser, MD  Naftifine HCl (NAFTIN) 2 % CREA Apply 1 application topically 1 day or 1 dose. 10/21/14  Yes Trula Slade, DPM  oxybutynin (DITROPAN) 5 MG tablet Take 1 tablet (5 mg total) by mouth 2 (two) times daily. appt please 02/06/16  Yes Arnetha Courser, MD      VITAL SIGNS:  Blood pressure (!) 163/66, pulse 67, temperature 97.6 F (36.4 C), temperature source Oral, resp. rate 18, height 5\' 4"  (1.626 m), weight 54 kg (119 lb), SpO2 99 %.  PHYSICAL EXAMINATION:  GENERAL:  82 y.o.-year-old patient lying in the bed with no acute distress.  EYES: Pupils equal, round, reactive to light and accommodation. No scleral icterus. Extraocular muscles intact.  HEENT: Head atraumatic, normocephalic. Oropharynx and nasopharynx clear.  NECK:  Supple, no jugular venous distention. No thyroid enlargement, no tenderness.  LUNGS: Normal breath sounds bilaterally, no wheezing, rales,rhonchi or crepitation. No use of accessory muscles of respiration.  CARDIOVASCULAR: S1, S2 normal. No murmurs, rubs, or gallops.  ABDOMEN: Soft, nontender, nondistended. Bowel sounds present. No organomegaly or mass.  EXTREMITIES: No pedal edema, cyanosis, or clubbing.  NEUROLOGIC: Cranial nerves II through XII are intact. Muscle strength 5/5 in all extremities. Sensation intact. Gait not checked.  PSYCHIATRIC: The patient is alert and oriented x 3.  SKIN: No obvious rash, lesion, or ulcer.   LABORATORY PANEL:   CBC  Recent Labs Lab 06/08/16 0738  WBC 6.6  HGB 12.8  HCT 38.6  PLT 221    ------------------------------------------------------------------------------------------------------------------  Chemistries   Recent Labs Lab 06/08/16 0738  NA 139  K 4.5  CL 106  CO2 27  GLUCOSE 100*  BUN 39*  CREATININE 1.21*  CALCIUM 10.0   ------------------------------------------------------------------------------------------------------------------  Cardiac Enzymes  Recent Labs Lab 06/08/16 0738  TROPONINI <0.03   ------------------------------------------------------------------------------------------------------------------  RADIOLOGY:  Ct Head Wo Contrast  Result Date: 06/08/2016 CLINICAL DATA:  Headache, possible stroke starting last night 5 p.m., blurry vision EXAM: CT HEAD WITHOUT CONTRAST TECHNIQUE: Contiguous axial images were obtained from the base of the skull through the vertex without intravenous contrast. COMPARISON:  None. FINDINGS: Brain: No intracranial hemorrhage, mass effect or midline shift. Mild cerebral atrophy. Punctate calcifications are noted bilateral basal ganglia. Mild periventricular and patchy subcortical white matter decreased attenuation probable due to chronic small vessel ischemic changes. No definite acute cortical infarction. No mass lesion is noted on this unenhanced scan. Vascular: Atherosclerotic calcifications of carotid siphon. Atherosclerotic calcifications of vertebral arteries. Skull: No skull fracture. Sinuses/Orbits: Paranasal sinuses and mastoid air cells are unremarkable. Other: None IMPRESSION: No acute intracranial abnormality. No definite acute cortical infarction. Mild cerebral atrophy. Periventricular and patchy subcortical white matter decreased attenuation probable due to  chronic small vessel ischemic changes. No definite acute cortical infarction. Electronically Signed   By: Lahoma Crocker M.D.   On: 06/08/2016 08:55   Mr Brain Wo Contrast  Result Date: 06/08/2016 CLINICAL DATA:  Cerebral infarction.  Dizziness  EXAM: MRI HEAD WITHOUT CONTRAST TECHNIQUE: Multiplanar, multiecho pulse sequences of the brain and surrounding structures were obtained without intravenous contrast. COMPARISON:  CT head of 06/08/2016 FINDINGS: Brain: Generalized atrophy. Negative for hydrocephalus. Negative for acute infarct. Moderate chronic microvascular ischemic change in the white matter. Brainstem and cerebellum intact. Negative for hemorrhage or mass. No shift of the midline structures. Pituitary not enlarged. Vascular: Normal arterial flow voids Skull and upper cervical spine: Degenerative changes C1-C2. 3 mm anterior listhesis C4-5. No acute skeletal lesion. Sinuses/Orbits: Negative Other: None IMPRESSION: Atrophy and chronic ischemia.  No acute abnormality. Electronically Signed   By: Franchot Gallo M.D.   On: 06/08/2016 11:06   Dg Chest Portable 1 View  Result Date: 06/08/2016 CLINICAL DATA:  Shortness of Breath EXAM: PORTABLE CHEST 1 VIEW COMPARISON:  August 04, 2014. FINDINGS: There are left upper lobe calcified granulomas as well as mediastinal calcified lymph nodes consistent with prior granulomatous disease. There is no edema or consolidation. Heart is upper normal in size with pulmonary vascularity within normal limits. There is aortic atherosclerosis. There is no evident adenopathy by size criteria. There is thoracolumbar levoscoliosis. IMPRESSION: Evidence of prior granulomatous disease. No edema or consolidation. Aortic atherosclerosis present. Electronically Signed   By: Lowella Grip III M.D.   On: 06/08/2016 08:04    EKG:   NSR with t wave inversion in lateral leads IMPRESSION AND PLAN:   Kimberly Abbott is a 82 y.o. female h/o CKD, GERD, esophageal stricture, hypertension, and hyperlipidemia who presents for evaluation of shortness of breath and dizziness. Patient reports that yesterday she had an episode of lightheadedness. She then felt better and went to bed. When she woke up at 10 PM she started feeling  room spinning and also dizzy like she was going to pass out  1. acute onset positional dizziness/vertigo -MRI of the brain negative for stroke -When necessary meclizine -We'll check ultrasound Doppler carotid and echo -Physical therapy to see patient  2. Abnormal EKG with normal troponin and no symptoms of chest pain or shortness of breath -Patient was seen by cardiology -Recommends cardiac catheterization however patient is not keen on getting it done right now she is asymptomatic will hold off on it. -Continue aspirin, Imdur and lisinopril  3. Hypertension -Continue lisinopril  4. DVT prophylaxis subcutaneous Lovenox  Above was discussed with patient and patient's daughter who was present in the room All the records are reviewed and case discussed with ED provider. Management plans discussed with the patient, family and they are in agreement.  CODE STATUS: Full  TOTAL TIME TAKING CARE OF THIS PATIENT: 40 minutes.    Kimberly Abbott M.D on 06/08/2016 at 12:58 PM  Between 7am to 6pm - Pager - [phone]  After 6pm go to [email or web address] - password [email or web address]  [facility]  Office  [phone]  CC: Primary care physician; Hoover Brunette, MD  [phone]

## 2016-06-08 NOTE — Progress Notes (Signed)
Initial Nutrition Assessment  DOCUMENTATION CODES:   Not applicable  INTERVENTION:  -Recommend Ensure Enlive po BID, each supplement provides 350 kcal and 20 grams of protein -Recommend Dysphagia III Diet due to difficulty swallowing related to hx of strictures; encourage small bites and chew food well, take sips of liquids after each bite  NUTRITION DIAGNOSIS:   Inadequate oral intake related to chronic illness, dysphagia as evidenced by per patient/family report.  GOAL:   Patient will meet greater than or equal to 90% of their needs  MONITOR:   PO intake, Supplement acceptance, Labs, Weight trends  REASON FOR ASSESSMENT:   Malnutrition Screening Tool    ASSESSMENT:    82 yo female admitted with dizziness  Pt reports poor appetite and intake with chronic issues with difficulty swallowing related to esophageal strictures. Pt reports she finds it difficult to find foods that she can easily swallow, searches daily. Pt does drink 1-2 Ensure per day. Pt reports gradual wt loss over time; report pt weighed 127 pounds a few years ago, current wt 119 pounds. Pt's husband died 3 years ago and per family there has been a gradual decline since them. Daughter indicates that she thinks her mom is ready to join her father; pt indicates that she has lived long enough  Unable to complete Nutrition-Focused physical exam at this time. Pt being taking down to radiology upon visit today, brief assessment only  Labs and meds reviewed  Past Medical History:  Diagnosis Date  . Allergy   . CKD (chronic kidney disease)   . GERD (gastroesophageal reflux disease)   . Hyperlipidemia   . Hypertension    Diet Order:  Soft  Skin:  Reviewed, no issues  Last BM:  no documented BM  Height:   Ht Readings from Last 1 Encounters:  06/08/16 5\' 4"  (1.626 m)    Weight:   Wt Readings from Last 1 Encounters:  06/08/16 119 lb (54 kg)    BMI:  Body mass index is 20.43 kg/m.  Estimated  Nutritional Needs:   Kcal:  1400-1600 kcals  Protein:  55-65 g  Fluid:  >/= 1.4 L  EDUCATION NEEDS:   No education needs identified at this time   Jeramie Scogin B. Zenia Resides, [place], [place] (pager)

## 2016-06-08 NOTE — Progress Notes (Signed)
PT Cancellation Note  Patient Details Name: Kimberly Abbott MRN: [id number] DOB: Dec 31, 1920   Cancelled Treatment:    Reason Eval/Treat Not Completed: Other (comment).  PT consult received.  Chart reviewed.  Attempted to see pt for PT eval but family member in room reporting pt just left for testing off floor (pt not in room).  Will re-attempt PT eval at a later date/time.   Raquel Sarna Dawnell Bryant 06/08/2016, 3:58 PM Leitha Bleak, [place]

## 2016-06-08 NOTE — Progress Notes (Signed)
Kimberly Abbott is a 82 y.o. female  [id number]  Primary Cardiologist: Neoma Laming Reason for Consultation: Chest pain and dizziness  HPI: This is a 82 year old white female with a past medical history of hypertension hyperlipidemia and no cardiac history presented to the hospital with presyncopal episode where she feels dizzy lightheaded and at one point became pale with heaviness in the chest. Her EKG was abnormal thus I was asked to evaluate the patient.   Review of Systems: No orthopnea PND or leg swelling   Past Medical History:  Diagnosis Date  . Allergy   . CKD (chronic kidney disease)   . GERD (gastroesophageal reflux disease)   . Hyperlipidemia   . Hypertension      (Not in a hospital admission)     Infusions:   Allergies  Allergen Reactions  . Penicillin G Hives  . Penicillins Hives    Social History   Social History  . Marital status: Widowed    Spouse name: N/A  . Number of children: N/A  . Years of education: N/A   Occupational History  . Not on file.   Social History Main Topics  . Smoking status: Never Smoker  . Smokeless tobacco: Never Used  . Alcohol use No  . Drug use: No  . Sexual activity: Not Currently   Other Topics Concern  . Not on file   Social History Narrative  . No narrative on file    No family history on file.  PHYSICAL EXAM: Vitals:   06/08/16 0731  BP: (!) 175/93  Pulse: 83  Resp: 18  Temp: 97.5 F (36.4 C)    No intake or output data in the 24 hours ending 06/08/16 0852  General:  Well appearing. No respiratory difficulty HEENT: normal Neck: supple. no JVD. Carotids 2+ bilat; no bruits. No lymphadenopathy or thryomegaly appreciated. Cor: PMI nondisplaced. Regular rate & rhythm. No rubs, gallops or murmurs. Lungs: clear Abdomen: soft, nontender, nondistended. No hepatosplenomegaly. No bruits or masses. Good bowel sounds. Extremities: no cyanosis, clubbing, rash, edema Neuro: alert & oriented x 3,  cranial nerves grossly intact. moves all 4 extremities w/o difficulty. Affect pleasant.  [id number] sinus rhythm with the diffuse T-wave inversion suggestive of ischemia but no ST elevation  Results for orders placed or performed during the hospital encounter of 06/08/16 (from the past 24 hour(s))  Basic metabolic panel     Status: Abnormal   Collection Time: 06/08/16  7:38 AM  Result Value Ref Range   Sodium 139 135 - 145 mmol/L   Potassium 4.5 3.5 - 5.1 mmol/L   Chloride 106 101 - 111 mmol/L   CO2 27 22 - 32 mmol/L   Glucose, Bld 100 (H) 65 - 99 mg/dL   BUN 39 (H) 6 - 20 mg/dL   Creatinine, Ser 1.21 (H) 0.44 - 1.00 mg/dL   Calcium 10.0 8.9 - 10.3 mg/dL   GFR calc non Af Amer 37 (L) >60 mL/min   GFR calc Af Amer 43 (L) >60 mL/min   Anion gap 6 5 - 15  CBC     Status: Abnormal   Collection Time: 06/08/16  7:38 AM  Result Value Ref Range   WBC 6.6 3.6 - 11.0 K/uL   RBC 4.37 3.80 - 5.20 MIL/uL   Hemoglobin 12.8 12.0 - 16.0 g/dL   HCT 38.6 35.0 - 47.0 %   MCV 88.3 80.0 - 100.0 fL   MCH 29.4 26.0 - 34.0 pg   MCHC 33.3 32.0 -  36.0 g/dL   RDW 15.2 (H) 11.5 - 14.5 %   Platelets 221 150 - 440 K/uL  Troponin I     Status: None   Collection Time: 06/08/16  7:38 AM  Result Value Ref Range   Troponin I <0.03 <0.03 ng/mL   Dg Chest Portable 1 View  Result Date: 06/08/2016 CLINICAL DATA:  Shortness of Breath EXAM: PORTABLE CHEST 1 VIEW COMPARISON:  August 04, 2014. FINDINGS: There are left upper lobe calcified granulomas as well as mediastinal calcified lymph nodes consistent with prior granulomatous disease. There is no edema or consolidation. Heart is upper normal in size with pulmonary vascularity within normal limits. There is aortic atherosclerosis. There is no evident adenopathy by size criteria. There is thoracolumbar levoscoliosis. IMPRESSION: Evidence of prior granulomatous disease. No edema or consolidation. Aortic atherosclerosis present. Electronically Signed   By: Lowella Grip III M.D.   On: 06/08/2016 08:04     ASSESSMENT AND PLAN:Atypical present patient for acute coronary syndrome. Patient has some dizziness and heaviness with some ischemic changes on EKG but is alert and oriented and is quite functional and does everything by herself and lives alone. However her daughter is with her and I discussed for a long time about type of care they want. Patient was presented ID of cardiac catheterization and was explained [name] as well as the daughter and patient keeps saying that she is 28 and she does not want to have any invasive procedure done. However advise giving the patient aspirin 81 mg and Plavix 75 mg once a day and heparin if CT of the head is negative. May also give statins and nitrates if indicated. We will treat the patient conservatively and do echocardiogram. Echocardiogram will be done to evaluate wall motion as well as ejection fraction.  Johnnette Laux A

## 2016-06-08 NOTE — ED Triage Notes (Signed)
Patient comes to ED via EMS for worsening shortness of breath that started last night and got worse as night went on. EMS reports that the shortness of breath was worse with exertion and that patient has issues with her esophagus and it being stretched. Patient reports having a cough that is productive with clear thick secretions. Patient is labored. EMS reports she was 99-100% RA on transport.

## 2016-06-08 NOTE — Care Management Obs Status (Signed)
[place] NOTIFICATION   Patient Details  Name: Kimberly Abbott MRN: [id number] Date of Birth: 1920-09-11   Medicare Observation Status Notification Given:  Yes    Katrina Stack, RN 06/08/2016, 12:58 PM

## 2016-06-08 NOTE — Care Management (Signed)
No discharge needs identified by care team 

## 2016-06-08 NOTE — Evaluation (Signed)
Clinical/Bedside Swallow Evaluation Patient Details  Name: Kimberly Abbott MRN: [id number] Date of Birth: [birth year]  Today's Date: 06/08/2016 Time: SLP Start Time (ACUTE ONLY): 1400 SLP Stop Time (ACUTE ONLY): 1500 SLP Time Calculation (min) (ACUTE ONLY): 60 min  Past Medical History:  Past Medical History:  Diagnosis Date  . Allergy   . CKD (chronic kidney disease)   . GERD (gastroesophageal reflux disease)   . Hyperlipidemia   . Hypertension    Past Surgical History:  Past Surgical History:  Procedure Laterality Date  . APPENDECTOMY    . rectal fistula  in the 40's  . TUBAL LIGATION     HPI:  Pt  is a 82 y.o. female with a known  h/o CKD, GERD, esophageal stricture, Hiatal Hernia, hypertension, and hyperlipidemia who presents for evaluation of shortness of breath and dizziness. Patient reports that yesterday she had an episode of lightheadedness. She then felt better and went to bed. When she woke up at 10 PM she started feeling room spinning and also dizzy like she was going to pass out. Patient never lost consciousness. No seizures noted. Pt is A/O x3; verbally conversive; follows all commands. Pt indicated she has "suffered" from the Esophageal dysmotility w/ regurgitation "for some time now". She stated certain foods are more difficult for her to eat and that it is "embarassing" to eat out having regurgitation episodes.    Assessment / Plan / Recommendation Clinical Impression  Pt appears to adequately tolerate trials of thin liquids and purees/soft solids cut well/moist w/ no immediate, overt s/s of aspiration noted; no overt difficulty or discomfort swallowing reported by pt during/post trials. Pt exhibited timely oral phase management, transfer, and oral clearing. Vocal quality clear b/t trials; no decline in respiratory status during/post trials. Pt consumed few trials of solids(meats then muffin) then began exhibiting overt Esophageal dysmotility s/s including belching and  burping. Education was given on general Reflux precautions and recommendation to stop eating/rest to allow Esophageal clearing - in order to avoid regurgitation of food/liquid material. As solid foods were the issue of difficulty, ST services recommends modifying the diet consistency to a Mech Soft consistency cutting the meats well and moistening them (add broth); thin liquids. Pt stated she had been eating less meats at home recently. Recommend general aspiration precautions; Reflux precautions; meds in Puree - for easier swallowing and clearing the Esophagus as needed. Thorough education given on Reflux and Esophageal dysmotility; food options and prepration. Recommended f/u by GI for more information re: hiatal hernia. NSG updated; agreed. Pt/family agreed.     Aspiration Risk   (reduced following precautions)    Diet Recommendation  Dysphagia 3 w/ thin liquids; general aspiration precautions; Reflux precautions  Medication Administration: Whole meds with puree (if needed)    Other  Recommendations Recommended Consults: Consider GI evaluation (for further assessment and education/information) Oral Care Recommendations: Oral care BID;Staff/trained caregiver to provide oral care   Follow up Recommendations None      Frequency and Duration            Prognosis Prognosis for Safe Diet Advancement: Fair (-good) Barriers to Reach Goals:  (baseline GI issues)      Swallow Study   General Date of Onset: 06/08/16 HPI: Pt  is a 82 y.o. female with a known  h/o CKD, GERD, esophageal stricture, Hiatal Hernia, hypertension, and hyperlipidemia who presents for evaluation of shortness of breath and dizziness. Patient reports that yesterday she had an episode of lightheadedness. She then felt better  and went to bed. When she woke up at 10 PM she started feeling room spinning and also dizzy like she was going to pass out. Patient never lost consciousness. No seizures noted. Pt is A/O x3; verbally  conversive; follows all commands. Pt indicated she has "suffered" from the Esophageal dysmotility w/ regurgitation "for some time now". She stated certain foods are more difficult for her to eat and that it is "embarassing" to eat out having regurgitation episodes.  Type of Study: Bedside Swallow Evaluation Previous Swallow Assessment: none indicated by pt; has been seen for Esophageal intervention and dx recently per MD report.  Diet Prior to this Study: Thin liquids;Regular (soft foods) Temperature Spikes Noted: No (wbc not elevated) Respiratory Status: Room air History of Recent Intubation: No Behavior/Cognition: Alert;Cooperative;Pleasant mood Oral Cavity Assessment: Within Functional Limits Oral Care Completed by SLP: Recent completion by staff Oral Cavity - Dentition: Adequate natural dentition Vision: Functional for self-feeding Self-Feeding Abilities: Able to feed self Patient Positioning: Upright in bed Baseline Vocal Quality: Normal Volitional Cough: Strong Volitional Swallow: Able to elicit    Oral/Motor/Sensory Function Overall Oral Motor/Sensory Function: Within functional limits   Ice Chips Ice chips: Not tested   Thin Liquid Thin Liquid: Within functional limits Presentation: Cup;Self Fed (~4 ozs)    Nectar Thick Nectar Thick Liquid: Not tested   Honey Thick Honey Thick Liquid: Not tested   Puree Puree: Within functional limits Presentation: Self Fed;Spoon (10+ trials)   Solid   GO   Solid: Within functional limits Presentation: Self Fed;Spoon (10+ trials) Other Comments: then began exhibiting overt Esophageal dysmotility s/s including belching and burping    Functional Assessment Tool Used: clinical judgement Functional Limitations: Swallowing Swallow Current Status [id number]): At least 1 percent but less than 20 percent impaired, limited or restricted Swallow Goal Status [phone]): At least 1 percent but less than 20 percent impaired, limited or restricted Swallow  Discharge Status [phone]): At least 1 percent but less than 20 percent impaired, limited or restricted    Kimberly Kenner, MS, CCC-SLP Willy Pinkerton 06/08/2016,4:54 PM

## 2016-06-09 ENCOUNTER — Observation Stay
Admit: 2016-06-09 | Discharge: 2016-06-09 | Disposition: A | Discharge status: Home / Self Care | Payer: Medicare HMO | Attending: Cardiovascular Disease | Admitting: Cardiovascular Disease

## 2016-06-09 DIAGNOSIS — R55 Syncope and collapse: Secondary | ICD-10-CM | POA: Diagnosis not present

## 2016-06-09 DIAGNOSIS — I249 Acute ischemic heart disease, unspecified: Secondary | ICD-10-CM | POA: Diagnosis not present

## 2016-06-09 DIAGNOSIS — R9431 Abnormal electrocardiogram [ECG] [EKG]: Secondary | ICD-10-CM | POA: Diagnosis not present

## 2016-06-09 DIAGNOSIS — I10 Essential (primary) hypertension: Secondary | ICD-10-CM | POA: Diagnosis not present

## 2016-06-09 DIAGNOSIS — R42 Dizziness and giddiness: Secondary | ICD-10-CM | POA: Diagnosis not present

## 2016-06-09 DIAGNOSIS — R079 Chest pain, unspecified: Secondary | ICD-10-CM | POA: Diagnosis not present

## 2016-06-09 DIAGNOSIS — R1310 Dysphagia, unspecified: Secondary | ICD-10-CM | POA: Diagnosis not present

## 2016-06-09 LAB — URINALYSIS COMPLETE WITH MICROSCOPIC (ARMC ONLY)
BILIRUBIN URINE: NEGATIVE
Glucose, UA: NEGATIVE mg/dL
HGB URINE DIPSTICK: NEGATIVE
Ketones, ur: NEGATIVE mg/dL
NITRITE: NEGATIVE
PH: 6 (ref 5.0–8.0)
Protein, ur: NEGATIVE mg/dL
Specific Gravity, Urine: 1.014 (ref 1.005–1.030)
Squamous Epithelial / LPF: NONE SEEN

## 2016-06-09 LAB — TROPONIN I: TROPONIN I: 0.04 ng/mL — AB (ref <0.03)

## 2016-06-09 NOTE — Progress Notes (Signed)
EGD on August 1st 2017 at [facility] showed  One mild benign-appearing, intrinsic stenosis was found in the upper    third of the esophagus. This measured greater than or equal to 3 cm    (inner diameter) x less than one cm (in length) and was traversed. The    adult endoscope passed easily.   The examined esophagus was significantly tortuous.   A medium-sized hiatal hernia was present.   No obvious lesion amenable to endoscopic therapy   There is no endoscopic evidence of esophagitis in the entire esophagus.   Diffuse atrophic mucosa was found in the gastric body and in the gastric    antrum.

## 2016-06-09 NOTE — Consult Note (Signed)
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 The patient can be considered for consultation with [facility] or [facility] given these findings. If  manometry confirms diagnosis endoscopy myotomy can be performed at these academic centers. For now continue with soft to pureed diet.            HPI:   Kimberly Abbott is a 82 y.o. female reports that yesterday she had an episode of lightheadedness. She then felt better and went to bed. When she woke up at 10 PM she started feeling room spinning and also dizzy like she was going to pass out. Patient never lost consciousness. No seizures noted.  She reports progressive dysphagia over the years. Difficulty with solids and liquids. Signifcant weight loss of the years. She reports two gastroenterologists in this area told her that endoscopy with dilation was too risky to perform. A barium swallow performed in 2015 showed the following: There is significant prominence of the cricopharyngeus with focal  narrowing of the proximal esophagus which persists throughout the  examination. There is significant residual contrast and phlegm  within the distended hypopharynx.  Past Medical History:  Diagnosis Date  . Allergy   . CKD (chronic kidney disease)   . GERD (gastroesophageal reflux disease)   . Hyperlipidemia   . Hypertension     Past Surgical History:  Procedure Laterality Date  . APPENDECTOMY    . rectal fistula  in the 40's  . TUBAL LIGATION      History reviewed. No pertinent family history.    Social History  Substance Use Topics  . Smoking status: Never Smoker  . Smokeless tobacco: Never Used  . Alcohol use No    Prior to Admission medications   Medication Sig Start Date End Date Taking?  Authorizing Provider  allopurinol (ZYLOPRIM) 100 MG tablet Take 100 mg by mouth daily.    Yes Historical Provider, MD  aspirin 81 MG chewable tablet Chew 1 tablet by mouth daily.   Yes Historical Provider, MD  cetirizine (ZYRTEC) 10 MG tablet Take 10 mg by mouth.   Yes Historical Provider, MD  Cholecalciferol (VITAMIN D) 2000 UNITS tablet Take 2,000 Units by mouth daily.    Yes Historical Provider, MD  clobetasol cream (TEMOVATE) [id number] % Apply 1 application topically 2 (two) times daily as needed. 08/04/14  Yes Historical Provider, MD  Cranberry ([employer] CRANBERRY) 500 MG CAPS Take 1 capsule by mouth daily.    Yes Historical Provider, MD  desoximetasone (TOPICORT) 0.05 % cream Apply topically 2 (two) times daily. 05/07/16  Yes Trula Slade, DPM  ketoconazole (NIZORAL) 2 % cream Apply 1 application topically daily. 10/13/15  Yes Trula Slade, DPM  lisinopril (PRINIVIL,ZESTRIL) 10 MG tablet Take 1 tablet (10 mg total) by mouth daily. (appt and labs needed please) 02/06/16  Yes Arnetha Courser, MD  Naftifine HCl (NAFTIN) 2 % CREA Apply 1 application topically 1 day or 1 dose. 10/21/14  Yes Trula Slade, DPM  oxybutynin (DITROPAN) 5 MG tablet Take 1 tablet (5 mg total) by mouth 2 (two) times daily. appt please 02/06/16  Yes Arnetha Courser, MD    Current Facility-Administered Medications  Medication Dose Route Frequency Provider Last  Rate Last Dose  . acetaminophen (TYLENOL) tablet 650 mg  650 mg Oral Q6H PRN Fritzi Mandes, MD       Or  . acetaminophen (TYLENOL) suppository 650 mg  650 mg Rectal Q6H PRN Fritzi Mandes, MD      . allopurinol (ZYLOPRIM) tablet 100 mg  100 mg Oral Daily Fritzi Mandes, MD   100 mg at 06/08/16 1341  . aspirin chewable tablet 81 mg  81 mg Oral Daily Fritzi Mandes, MD      . cholecalciferol (VITAMIN D) tablet 2,000 Units  2,000 Units Oral Daily Fritzi Mandes, MD   2,000 Units at 06/08/16 1340  . docusate sodium (COLACE) capsule 100 mg  100 mg Oral BID Fritzi Mandes, MD   100 mg at 06/08/16  2049  . feeding supplement (ENSURE ENLIVE) (ENSURE ENLIVE) liquid 237 mL  237 mL Oral BID BM Fritzi Mandes, MD   237 mL at 06/09/16 1433  . heparin injection 5,000 Units  5,000 Units Subcutaneous Q8H Fritzi Mandes, MD   5,000 Units at 06/09/16 1433  . isosorbide mononitrate (IMDUR) 24 hr tablet 30 mg  30 mg Oral Daily Fritzi Mandes, MD   30 mg at 06/08/16 1341  . lisinopril (PRINIVIL,ZESTRIL) tablet 10 mg  10 mg Oral Daily Fritzi Mandes, MD   10 mg at 06/08/16 1340  . loratadine (CLARITIN) tablet 10 mg  10 mg Oral Daily Fritzi Mandes, MD   10 mg at 06/08/16 1341  . meclizine (ANTIVERT) tablet 12.5 mg  12.5 mg Oral TID PRN Fritzi Mandes, MD      . ondansetron (ZOFRAN) tablet 4 mg  4 mg Oral Q6H PRN Fritzi Mandes, MD       Or  . ondansetron (ZOFRAN) injection 4 mg  4 mg Intravenous Q6H PRN Fritzi Mandes, MD      . oxybutynin (DITROPAN) tablet 5 mg  5 mg Oral BID Fritzi Mandes, MD   5 mg at 06/08/16 2049  . traMADol (ULTRAM) tablet 50 mg  50 mg Oral Q6H PRN Fritzi Mandes, MD        Allergies as of 06/08/2016 - Review Complete 06/08/2016  Allergen Reaction Noted  . Penicillin g Hives 12/31/2014  . Penicillins Hives 08/05/2014     Review of Systems:    This is positive for those things mentioned in the HPI, . All other review of systems are negative.       Physical Exam:  Vital signs in last 24 hours: Temp:  [97.6 F (36.4 C)-98 F (36.7 C)] 98 F (36.7 C) (11/11 0751) Pulse Rate:  [75-87] 75 (11/11 1104) Resp:  [15-18] 16 (11/11 1104) BP: (110-146)/(53-64) 146/59 (11/11 1104) SpO2:  [97 %-100 %] 98 % (11/11 1104) Last BM Date: 06/08/16  General:  Well-developed, well-nourished and in no acute distress Eyes:  anicteric. ENT:   Mouth and posterior pharynx free of lesions.  Neck:   supple w/o thyromegaly or mass.  Lungs: Clear to auscultation bilaterally. Heart:  S1S2, no rubs, murmurs, gallops. Abdomen:  soft, non-tender, no hepatosplenomegaly, hernia, or mass and BS+.  Rectal: Lymph:  no cervical or  supraclavicular adenopathy. Extremities:   no edema Skin   no rash. Neuro:  A&O x 3.  Psych:  appropriate mood and  Affect.   Data Reviewed:   LAB RESULTS:  Recent Labs  06/08/16 0738  WBC 6.6  HGB 12.8  HCT 38.6  PLT 221   BMET  Recent Labs  06/08/16 0738  NA 139  K  4.5  CL 106  CO2 27  GLUCOSE 100*  BUN 39*  CREATININE 1.21*  CALCIUM 10.0   LFT No results for input(s): PROT, ALBUMIN, AST, ALT, ALKPHOS, BILITOT, BILIDIR, IBILI in the last 72 hours. PT/INR No results for input(s): LABPROT, INR in the last 72 hours.  STUDIES: Ct Head Wo Contrast  Result Date: 06/08/2016 CLINICAL DATA:  Headache, possible stroke starting last night 5 p.m., blurry vision EXAM: CT HEAD WITHOUT CONTRAST TECHNIQUE: Contiguous axial images were obtained from the base of the skull through the vertex without intravenous contrast. COMPARISON:  None. FINDINGS: Brain: No intracranial hemorrhage, mass effect or midline shift. Mild cerebral atrophy. Punctate calcifications are noted bilateral basal ganglia. Mild periventricular and patchy subcortical white matter decreased attenuation probable due to chronic small vessel ischemic changes. No definite acute cortical infarction. No mass lesion is noted on this unenhanced scan. Vascular: Atherosclerotic calcifications of carotid siphon. Atherosclerotic calcifications of vertebral arteries. Skull: No skull fracture. Sinuses/Orbits: Paranasal sinuses and mastoid air cells are unremarkable. Other: None IMPRESSION: No acute intracranial abnormality. No definite acute cortical infarction. Mild cerebral atrophy. Periventricular and patchy subcortical white matter decreased attenuation probable due to chronic small vessel ischemic changes. No definite acute cortical infarction. Electronically Signed   By: Lahoma Crocker M.D.   On: 06/08/2016 08:55   Mr Brain Wo Contrast  Result Date: 06/08/2016 CLINICAL DATA:  Cerebral infarction.  Dizziness EXAM: MRI HEAD  WITHOUT CONTRAST TECHNIQUE: Multiplanar, multiecho pulse sequences of the brain and surrounding structures were obtained without intravenous contrast. COMPARISON:  CT head of 06/08/2016 FINDINGS: Brain: Generalized atrophy. Negative for hydrocephalus. Negative for acute infarct. Moderate chronic microvascular ischemic change in the white matter. Brainstem and cerebellum intact. Negative for hemorrhage or mass. No shift of the midline structures. Pituitary not enlarged. Vascular: Normal arterial flow voids Skull and upper cervical spine: Degenerative changes C1-C2. 3 mm anterior listhesis C4-5. No acute skeletal lesion. Sinuses/Orbits: Negative Other: None IMPRESSION: Atrophy and chronic ischemia.  No acute abnormality. Electronically Signed   By: Franchot Gallo M.D.   On: 06/08/2016 11:06   US Carotid Bilateral  Result Date: 06/08/2016 CLINICAL DATA:  Stroke EXAM: BILATERAL CAROTID DUPLEX ULTRASOUND TECHNIQUE: Pearline Cables scale imaging, color Doppler and duplex ultrasound were performed of bilateral carotid and vertebral arteries in the neck. COMPARISON:  None. FINDINGS: Criteria: Quantification of carotid stenosis is based on velocity parameters that correlate the residual internal carotid diameter with NASCET-based stenosis levels, using the diameter of the distal internal carotid lumen as the denominator for stenosis measurement. The following velocity measurements were obtained: RIGHT ICA:  86 cm/sec CCA:  83 cm/sec SYSTOLIC ICA/CCA RATIO:  0.7 DIASTOLIC ICA/CCA RATIO:  0.9 ECA:  245 cm/sec LEFT ICA:  57 cm/sec CCA:  95 cm/sec SYSTOLIC ICA/CCA RATIO:  0.9 DIASTOLIC ICA/CCA RATIO:  0.8 ECA:  121 cm/sec RIGHT CAROTID ARTERY: Moderate calcified plaque in the upper common carotid and bulb. Low resistance internal carotid Doppler pattern is preserved. There is extensive plaque at the origin of the external carotid. RIGHT VERTEBRAL ARTERY:  Antegrade. LEFT CAROTID ARTERY: Little if any plaque in the bulb. Low resistance  internal carotid Doppler pattern is preserved. There is mild calcified plaque in the lower internal carotid. LEFT VERTEBRAL ARTERY:  Antegrade. IMPRESSION: Less than 50% stenosis in the right and left internal carotid arteries. Electronically Signed   By: Marybelle Killings M.D.   On: 06/08/2016 16:42   Dg Chest Portable 1 View  Result Date: 06/08/2016 CLINICAL DATA:  Shortness  of Breath EXAM: PORTABLE CHEST 1 VIEW COMPARISON:  August 04, 2014. FINDINGS: There are left upper lobe calcified granulomas as well as mediastinal calcified lymph nodes consistent with prior granulomatous disease. There is no edema or consolidation. Heart is upper normal in size with pulmonary vascularity within normal limits. There is aortic atherosclerosis. There is no evident adenopathy by size criteria. There is thoracolumbar levoscoliosis. IMPRESSION: Evidence of prior granulomatous disease. No edema or consolidation. Aortic atherosclerosis present. Electronically Signed   By: Lowella Grip III M.D.   On: 06/08/2016 08:04     PREVIOUS ENDOSCOPIES:                Thanks   LOS: 1 day   San Jetty MD @  06/09/2016, 3:53 PM

## 2016-06-09 NOTE — Plan of Care (Signed)
Problem: Safety: Goal: Ability to remain free from injury will improve Outcome: Progressing Fall precautions in place  Problem: Pain Managment: Goal: General experience of comfort will improve Outcome: Progressing PRN medications  Problem: Tissue Perfusion: Goal: Risk factors for ineffective tissue perfusion will decrease Outcome: Progressing SQ Heparin

## 2016-06-09 NOTE — Progress Notes (Signed)
[facility] NAME: Kimberly Abbott    MR#:  [id number]  DATE OF BIRTH:  06-06-1921  SUBJECTIVE:    C/o difficulty swallowing. Unable to get food down or pills down. No vomting Pt was seen by Dysphagia III diet. Spitting up pills also REVIEW OF SYSTEMS:   Review of Systems  Constitutional: Negative for chills, fever and weight loss.  HENT: Negative for ear discharge, ear pain and nosebleeds.   Eyes: Negative for blurred vision, pain and discharge.  Respiratory: Negative for sputum production, shortness of breath, wheezing and stridor.   Cardiovascular: Negative for chest pain, palpitations, orthopnea and PND.  Gastrointestinal: Negative for abdominal pain, diarrhea, nausea and vomiting.       Dysphagia++  Genitourinary: Negative for frequency and urgency.  Musculoskeletal: Negative for back pain and joint pain.  Neurological: Positive for dizziness and weakness. Negative for sensory change, speech change and focal weakness.  Psychiatric/Behavioral: Negative for depression and hallucinations. The patient is not nervous/anxious.    Tolerating Diet: not tolerating Dysphagia III Tolerating PT: pending  DRUG ALLERGIES:   Allergies  Allergen Reactions  . Penicillin G Hives  . Penicillins Hives    Has patient had a PCN reaction causing immediate rash, facial/tongue/throat swelling, SOB or lightheadedness with hypotension: No Has patient had a PCN reaction causing severe rash involving mucus membranes or skin necrosis: No Has patient had a PCN reaction that required hospitalization No Has patient had a PCN reaction occurring within the last 10 years: No If all of the above answers are "NO", then may proceed with Cephalosporin use.    VITALS:  Blood pressure 135/64, pulse 81, temperature 98 F (36.7 C), temperature source Oral, resp. rate 15, height 5\' 4"  (1.626 m), weight 54 kg (119 lb), SpO2 100 %.  PHYSICAL EXAMINATION:    Physical Exam  GENERAL:  82 y.o.-year-old patient lying in the bed with no acute distress.  EYES: Pupils equal, round, reactive to light and accommodation. No scleral icterus. Extraocular muscles intact.  HEENT: Head atraumatic, normocephalic. Oropharynx and nasopharynx clear.  NECK:  Supple, no jugular venous distention. No thyroid enlargement, no tenderness.  LUNGS: Normal breath sounds bilaterally, no wheezing, rales, rhonchi. No use of accessory muscles of respiration.  CARDIOVASCULAR: S1, S2 normal. No murmurs, rubs, or gallops.  ABDOMEN: Soft, nontender, nondistended. Bowel sounds present. No organomegaly or mass.  EXTREMITIES: No cyanosis, clubbing or edema b/l.    NEUROLOGIC: Cranial nerves II through XII are intact. No focal Motor or sensory deficits b/l.   PSYCHIATRIC:  patient is alert and oriented x 3.  SKIN: No obvious rash, lesion, or ulcer.   LABORATORY PANEL:  CBC  Recent Labs Lab 06/08/16 0738  WBC 6.6  HGB 12.8  HCT 38.6  PLT 221    Chemistries   Recent Labs Lab 06/08/16 0738  NA 139  K 4.5  CL 106  CO2 27  GLUCOSE 100*  BUN 39*  CREATININE 1.21*  CALCIUM 10.0   Cardiac Enzymes  Recent Labs Lab 06/09/16 0124  TROPONINI 0.04*   RADIOLOGY:  Ct Head Wo Contrast  Result Date: 06/08/2016 CLINICAL DATA:  Headache, possible stroke starting last night 5 p.m., blurry vision EXAM: CT HEAD WITHOUT CONTRAST TECHNIQUE: Contiguous axial images were obtained from the base of the skull through the vertex without intravenous contrast. COMPARISON:  None. FINDINGS: Brain: No intracranial hemorrhage, mass effect or midline shift. Mild cerebral atrophy. Punctate calcifications are noted bilateral basal  ganglia. Mild periventricular and patchy subcortical white matter decreased attenuation probable due to chronic small vessel ischemic changes. No definite acute cortical infarction. No mass lesion is noted on this unenhanced scan. Vascular: Atherosclerotic  calcifications of carotid siphon. Atherosclerotic calcifications of vertebral arteries. Skull: No skull fracture. Sinuses/Orbits: Paranasal sinuses and mastoid air cells are unremarkable. Other: None IMPRESSION: No acute intracranial abnormality. No definite acute cortical infarction. Mild cerebral atrophy. Periventricular and patchy subcortical white matter decreased attenuation probable due to chronic small vessel ischemic changes. No definite acute cortical infarction. Electronically Signed   By: Lahoma Crocker M.D.   On: 06/08/2016 08:55   Mr Brain Wo Contrast  Result Date: 06/08/2016 CLINICAL DATA:  Cerebral infarction.  Dizziness EXAM: MRI HEAD WITHOUT CONTRAST TECHNIQUE: Multiplanar, multiecho pulse sequences of the brain and surrounding structures were obtained without intravenous contrast. COMPARISON:  CT head of 06/08/2016 FINDINGS: Brain: Generalized atrophy. Negative for hydrocephalus. Negative for acute infarct. Moderate chronic microvascular ischemic change in the white matter. Brainstem and cerebellum intact. Negative for hemorrhage or mass. No shift of the midline structures. Pituitary not enlarged. Vascular: Normal arterial flow voids Skull and upper cervical spine: Degenerative changes C1-C2. 3 mm anterior listhesis C4-5. No acute skeletal lesion. Sinuses/Orbits: Negative Other: None IMPRESSION: Atrophy and chronic ischemia.  No acute abnormality. Electronically Signed   By: Franchot Gallo M.D.   On: 06/08/2016 11:06   US Carotid Bilateral  Result Date: 06/08/2016 CLINICAL DATA:  Stroke EXAM: BILATERAL CAROTID DUPLEX ULTRASOUND TECHNIQUE: Pearline Cables scale imaging, color Doppler and duplex ultrasound were performed of bilateral carotid and vertebral arteries in the neck. COMPARISON:  None. FINDINGS: Criteria: Quantification of carotid stenosis is based on velocity parameters that correlate the residual internal carotid diameter with NASCET-based stenosis levels, using the diameter of the distal  internal carotid lumen as the denominator for stenosis measurement. The following velocity measurements were obtained: RIGHT ICA:  86 cm/sec CCA:  83 cm/sec SYSTOLIC ICA/CCA RATIO:  0.7 DIASTOLIC ICA/CCA RATIO:  0.9 ECA:  245 cm/sec LEFT ICA:  57 cm/sec CCA:  95 cm/sec SYSTOLIC ICA/CCA RATIO:  0.9 DIASTOLIC ICA/CCA RATIO:  0.8 ECA:  121 cm/sec RIGHT CAROTID ARTERY: Moderate calcified plaque in the upper common carotid and bulb. Low resistance internal carotid Doppler pattern is preserved. There is extensive plaque at the origin of the external carotid. RIGHT VERTEBRAL ARTERY:  Antegrade. LEFT CAROTID ARTERY: Little if any plaque in the bulb. Low resistance internal carotid Doppler pattern is preserved. There is mild calcified plaque in the lower internal carotid. LEFT VERTEBRAL ARTERY:  Antegrade. IMPRESSION: Less than 50% stenosis in the right and left internal carotid arteries. Electronically Signed   By: Marybelle Killings M.D.   On: 06/08/2016 16:42   Dg Chest Portable 1 View  Result Date: 06/08/2016 CLINICAL DATA:  Shortness of Breath EXAM: PORTABLE CHEST 1 VIEW COMPARISON:  August 04, 2014. FINDINGS: There are left upper lobe calcified granulomas as well as mediastinal calcified lymph nodes consistent with prior granulomatous disease. There is no edema or consolidation. Heart is upper normal in size with pulmonary vascularity within normal limits. There is aortic atherosclerosis. There is no evident adenopathy by size criteria. There is thoracolumbar levoscoliosis. IMPRESSION: Evidence of prior granulomatous disease. No edema or consolidation. Aortic atherosclerosis present. Electronically Signed   By: Lowella Grip III M.D.   On: 06/08/2016 08:04   ASSESSMENT AND PLAN:   Keyonah Luers a 82 y.o.femaleh/o CKD, GERD, esophageal stricture, hypertension, and hyperlipidemia who presents for evaluation of shortness  of breath and dizziness. Patient reports that yesterday she had an episode of  lightheadedness. She then felt better and went to bed. When she woke up at 10 PM she started feeling room spinning and also dizzy like she was going to pass out  1. acute onset positional dizziness/vertigo -MRI of the brain negative for stroke -When necessary meclizine - ultrasound Doppler carotid  <50% stenosis of bilateral ICA  - echo pending -Physical therapy to see patient  2. Abnormal EKG with normal troponin and no symptoms of chest pain or shortness of breath -Patient was seen by cardiology -Recommends cardiac catheterization however patient is not keen on getting it done right now she is asymptomatic will hold off on it. -Continue aspirin,Imdur and lisinopril  3. Hypertension -Continue lisinopril  4. DVT prophylaxis subcutaneous Lovenox  5. Acute on Chronic dysphagia -pt unable to swallow solids including pills -able to keep fluids down She has h/o esophageal stenosis, tortuous esophagus -GI to see pt EGD vs barium swallow  She had EGD done in [facility] in august 2017  Case discussed with Care Management/Social Worker. Management plans discussed with the patient, family and they are in agreement.  CODE STATUS: FULL  TOTAL TIME TAKING CARE OF THIS PATIENT: 30 minutes.  >50% time spent on counselling and coordination of care  POSSIBLE D/C IN1-2 DAYS, DEPENDING ON CLINICAL CONDITION.  Note: This dictation was prepared with Dragon dictation along with smaller phrase technology. Any transcriptional errors that result from this process are unintentional.  Maico Mulvehill M.D on 06/09/2016 at 10:00 AM  Between 7am to 6pm - Pager - [phone]  After 6pm go to [email or web address] - password [email or web address] [facility]  Office  [phone]  CC: Primary care physician; Hoover Brunette, MD

## 2016-06-09 NOTE — Progress Notes (Signed)
To echocardiogram via bed 

## 2016-06-09 NOTE — Evaluation (Signed)
Physical Therapy Evaluation Patient Details Name: Kimberly Abbott MRN: [id number] DOB: [birth year] Today's Date: 06/09/2016   History of Present Illness  Kimberly Abbott  is a 82 y.o. female with a known  h/o CKD, GERD, esophageal stricture, hypertension, and hyperlipidemia who presents for evaluation of shortness of breath and dizziness.   Clinical Impression  Pt presents to PT near baseline but with some endurance deficits with ambulation and would benefit from acute PT services to address objective findings.  Pt with no c/o dizziness or room spinning this date and feels better although not 100%.  Pt required HHA for amb 200' and would benefit from higher level balance activities.  Recommend pt to participate as tolerated with senior exercise group at her current residence.    Follow Up Recommendations Home health PT    Equipment Recommendations  None recommended by PT    Recommendations for Other Services       Precautions / Restrictions Precautions Precautions: Fall Restrictions Weight Bearing Restrictions: No      Mobility  Bed Mobility Overal bed mobility: Modified Independent             General bed mobility comments: HOB elevated; exits and enters bed with age appropriate pacing and sequencing  Transfers Overall transfer level: Modified independent Equipment used: None             General transfer comment: Good trasnfer of weight onto feet, no deviations noted.  Ambulation/Gait Ambulation/Gait assistance: Modified independent (Device/Increase time) Ambulation Distance (Feet): 185 Feet Assistive device: 1 person hand held assist Gait Pattern/deviations: Step-through pattern     General Gait Details: HHA used to replicate use of cane; pt with steady gait, no balance deviations despite decreased cadence, also age appropriate; c/o SOB at end of session, needing rest.  Stairs            Wheelchair Mobility    Modified Rankin (Stroke Patients Only)        Balance Overall balance assessment: Modified Independent                                           Pertinent Vitals/Pain Pain Assessment: No/denies pain    Home Living Family/patient expects to be discharged to:: Private residence Living Arrangements: Alone Available Help at Discharge: Family Type of Home: Apartment ("senior apartment") Home Access: Level entry     Home Layout: One level Home Equipment: Radio producer - single point      Prior Function Level of Independence: Independent with assistive device(s)         Comments: Pt takes care of all her own needs including bathing, dressing, cleaning, ironing, and meal preparation; pt has access to meals in dining room, but does not use; Pt amb with cane outside apartment     Hand Dominance        Extremity/Trunk Assessment   Upper Extremity Assessment: Overall WFL for tasks assessed           Lower Extremity Assessment: Overall WFL for tasks assessed         Communication   Communication: No difficulties  Cognition Arousal/Alertness: Awake/alert Behavior During Therapy: WFL for tasks assessed/performed Overall Cognitive Status: Within Functional Limits for tasks assessed                      General Comments General comments (skin integrity, edema, etc.): c/o difficulty  swallowing, see SLP notes for details    Exercises     Assessment/Plan    PT Assessment Patient needs continued PT services  PT Problem List Decreased activity tolerance          PT Treatment Interventions Gait training;Functional mobility training;Therapeutic exercise    PT Goals (Current goals can be found in the Care Plan section)  Acute Rehab PT Goals Patient Stated Goal: To be able to swallow better. PT Goal Formulation: With patient Potential to Achieve Goals: Good    Frequency Min 2X/week   Barriers to discharge        Co-evaluation               End of Session Equipment Utilized  During Treatment: Gait belt Activity Tolerance: Patient tolerated treatment well Patient left: in bed;with call bell/phone within reach;with family/visitor present Nurse Communication: Mobility status    Functional Assessment Tool Used: clinical judgement Functional Limitation: Mobility: Walking and moving around Mobility: Walking and Moving Around Current Status [id number]): At least 20 percent but less than 40 percent impaired, limited or restricted Mobility: Walking and Moving Around Goal Status [phone]): At least 1 percent but less than 20 percent impaired, limited or restricted    Time: 1131-1152 PT Time Calculation (min) (ACUTE ONLY): 21 min   Charges:   PT Evaluation $PT Eval Low Complexity: 1 Procedure PT Treatments $Gait Training: 8-22 mins   PT G Codes:   PT G-Codes **NOT FOR INPATIENT CLASS** Functional Assessment Tool Used: clinical judgement Functional Limitation: Mobility: Walking and moving around Mobility: Walking and Moving Around Current Status [id number]): At least 20 percent but less than 40 percent impaired, limited or restricted Mobility: Walking and Moving Around Goal Status [phone]): At least 1 percent but less than 20 percent impaired, limited or restricted    [employer], PT 06/09/2016, 12:18 PM

## 2016-06-09 NOTE — Progress Notes (Signed)
SUBJECTIVE: Patient denies any chest pain or shortness of breath or dizziness   Vitals:   06/08/16 1146 06/08/16 1922 06/09/16 0606 06/09/16 0751  BP: (!) 163/66 (!) 110/53 122/60 135/64  Pulse: 67 87 81 81  Resp: 18 15 18 15   Temp: 97.6 F (36.4 C) 97.6 F (36.4 C) 97.7 F (36.5 C) 98 F (36.7 C)  TempSrc: Oral Oral Oral Oral  SpO2: 99% 97% 99% 100%  Weight:      Height:        Intake/Output Summary (Last 24 hours) at 06/09/16 1016 Last data filed at 06/09/16 0700  Gross per 24 hour  Intake              240 ml  Output              100 ml  Net              140 ml    LABS: Basic Metabolic Panel:  Recent Labs  06/08/16 0738  NA 139  K 4.5  CL 106  CO2 27  GLUCOSE 100*  BUN 39*  CREATININE 1.21*  CALCIUM 10.0   Liver Function Tests: No results for input(s): AST, ALT, ALKPHOS, BILITOT, PROT, ALBUMIN in the last 72 hours. No results for input(s): LIPASE, AMYLASE in the last 72 hours. CBC:  Recent Labs  06/08/16 0738  WBC 6.6  HGB 12.8  HCT 38.6  MCV 88.3  PLT 221   Cardiac Enzymes:  Recent Labs  06/08/16 1407 06/08/16 1918 06/09/16 0124  TROPONINI 0.03* 0.03* 0.04*   BNP: Invalid input(s): POCBNP D-Dimer: No results for input(s): DDIMER in the last 72 hours. Hemoglobin A1C: No results for input(s): HGBA1C in the last 72 hours. Fasting Lipid Panel: No results for input(s): CHOL, HDL, LDLCALC, TRIG, CHOLHDL, LDLDIRECT in the last 72 hours. Thyroid Function Tests: No results for input(s): TSH, T4TOTAL, T3FREE, THYROIDAB in the last 72 hours.  Invalid input(s): FREET3 Anemia Panel: No results for input(s): VITAMINB12, FOLATE, FERRITIN, TIBC, IRON, RETICCTPCT in the last 72 hours.   PHYSICAL EXAM General: Well developed, well nourished, in no acute distress HEENT:  Normocephalic and atramatic Neck:  No JVD.  Lungs: Clear bilaterally to auscultation and percussion. Heart: HRRR . Normal S1 and S2 without gallops or murmurs.  Abdomen: Bowel  sounds are positive, abdomen soft and non-tender  Msk:  Back normal, normal gait. Normal strength and tone for age. Extremities: No clubbing, cyanosis or edema.   Neuro: Alert and oriented X 3. Psych:  Good affect, responds appropriately  TELEMETRY:Sinus rhythm  ASSESSMENT AND PLAN:Atypical present patient for coronary artery disease with mildly elevated troponin and ischemic changes on EKG. Patient denies any chest pain or shortness of breath or dizziness this morning. We can follow the patient as an outpatient upon discharge.  Active Problems:   Abnormal EKG    KHAN,SHAUKAT A, MD, [facility] 06/09/2016 10:16 AM

## 2016-06-09 NOTE — Progress Notes (Signed)
Back to room from echocardiogram

## 2016-06-10 DIAGNOSIS — I10 Essential (primary) hypertension: Secondary | ICD-10-CM | POA: Diagnosis not present

## 2016-06-10 DIAGNOSIS — R1310 Dysphagia, unspecified: Secondary | ICD-10-CM | POA: Diagnosis not present

## 2016-06-10 DIAGNOSIS — R42 Dizziness and giddiness: Secondary | ICD-10-CM | POA: Diagnosis not present

## 2016-06-10 DIAGNOSIS — R9431 Abnormal electrocardiogram [ECG] [EKG]: Secondary | ICD-10-CM | POA: Diagnosis not present

## 2016-06-10 DIAGNOSIS — R079 Chest pain, unspecified: Secondary | ICD-10-CM | POA: Diagnosis not present

## 2016-06-10 DIAGNOSIS — I249 Acute ischemic heart disease, unspecified: Secondary | ICD-10-CM | POA: Diagnosis not present

## 2016-06-10 DIAGNOSIS — R55 Syncope and collapse: Secondary | ICD-10-CM | POA: Diagnosis not present

## 2016-06-10 LAB — ECHOCARDIOGRAM COMPLETE
AO mean calculated velocity dopler: 102 cm/s
AOPV: 0.92 m/s
AOVTI: 27.8 cm
AV Area VTI index: 1.26 cm2/m2
AV Area mean vel: 2.04 cm2
AV Mean grad: 5 mmHg
AV Peak grad: 10 mmHg
AV VEL mean LVOT/AV: 0.9
AV pk vel: 156 cm/s
AVA: 1.97 cm2
AVAREAMEANVIN: 1.31 cm2/m2
AVAREAVTI: 2.08 cm2
CHL CUP AV PEAK INDEX: 1.33
CHL CUP AV VALUE AREA INDEX: 1.26
CHL CUP AV VEL: 1.97
CHL CUP DOP CALC LVOT VTI: 24.1 cm
CHL CUP MV DEC (S): 208
E decel time: 208 msec
E/e' ratio: 20.63
FS: 31 % (ref 28–44)
Height: 64 in
IVS/LV PW RATIO, ED: 1.33
LA diam end sys: 41 mm
LA vol A4C: 39.9 ml
LADIAMINDEX: 2.63 cm/m2
LASIZE: 41 mm
LDCA: 2.27 cm2
LV E/e'average: 20.63
LV PW d: 8.34 mm — AB (ref 0.6–1.1)
LV e' LATERAL: 3.81 cm/s
LVEEMED: 20.63
LVOT SV: 55 mL
LVOT diameter: 17 mm
LVOT peak VTI: 0.87 cm
LVOT peak grad rest: 8 mmHg
LVOTPV: 143 cm/s
Lateral S' vel: 10.4 cm/s
MV pk E vel: 78.6 m/s
MVPG: 2 mmHg
MVPKAVEL: 113 m/s
TAPSE: 21.5 mm
TDI e' lateral: 3.81
TDI e' medial: 3.48
Weight: 1904 oz

## 2016-06-10 MED ORDER — CIPROFLOXACIN HCL 500 MG PO TABS
250.0000 mg | ORAL_TABLET | Freq: Two times a day (BID) | ORAL | Status: DC
  Administered 2016-06-10: 250 mg via ORAL
  Filled 2016-06-10: qty 1

## 2016-06-10 MED ORDER — MECLIZINE HCL 12.5 MG PO TABS: 12.5000 mg | ORAL_TABLET | Freq: Three times a day (TID) | ORAL | 0 refills | Status: DC | PRN

## 2016-06-10 MED ORDER — ENSURE ENLIVE PO LIQD: 237.0000 mL | Freq: Two times a day (BID) | ORAL | 12 refills | Status: AC

## 2016-06-10 MED ORDER — ISOSORBIDE MONONITRATE ER 30 MG PO TB24: 30.0000 mg | ORAL_TABLET | Freq: Every day | ORAL | 0 refills | Status: DC

## 2016-06-10 MED ORDER — CIPROFLOXACIN HCL 250 MG PO TABS: 250.0000 mg | ORAL_TABLET | Freq: Two times a day (BID) | ORAL | 0 refills | Status: DC

## 2016-06-10 NOTE — Progress Notes (Signed)
SUBJECTIVE: Patient is feeling much better no chest pain no dizziness   Vitals:   06/09/16 2018 06/10/16 0500 06/10/16 0802 06/10/16 1103  BP: (!) 120/45 (!) 118/53 (!) 129/58 113/75  Pulse: 98 72 84 78  Resp: 18 18 17 18   Temp: 98.2 F (36.8 C) 97.6 F (36.4 C) 97.7 F (36.5 C) 98.2 F (36.8 C)  TempSrc: Oral Oral Oral   SpO2: 99% 97% 100% 97%  Weight:      Height:        Intake/Output Summary (Last 24 hours) at 06/10/16 1119 Last data filed at 06/10/16 0900  Gross per 24 hour  Intake                0 ml  Output               50 ml  Net              -50 ml    LABS: Basic Metabolic Panel:  Recent Labs  06/08/16 0738  NA 139  K 4.5  CL 106  CO2 27  GLUCOSE 100*  BUN 39*  CREATININE 1.21*  CALCIUM 10.0   Liver Function Tests: No results for input(s): AST, ALT, ALKPHOS, BILITOT, PROT, ALBUMIN in the last 72 hours. No results for input(s): LIPASE, AMYLASE in the last 72 hours. CBC:  Recent Labs  06/08/16 0738  WBC 6.6  HGB 12.8  HCT 38.6  MCV 88.3  PLT 221   Cardiac Enzymes:  Recent Labs  06/08/16 1407 06/08/16 1918 06/09/16 0124  TROPONINI 0.03* 0.03* 0.04*   BNP: Invalid input(s): POCBNP D-Dimer: No results for input(s): DDIMER in the last 72 hours. Hemoglobin A1C: No results for input(s): HGBA1C in the last 72 hours. Fasting Lipid Panel: No results for input(s): CHOL, HDL, LDLCALC, TRIG, CHOLHDL, LDLDIRECT in the last 72 hours. Thyroid Function Tests: No results for input(s): TSH, T4TOTAL, T3FREE, THYROIDAB in the last 72 hours.  Invalid input(s): FREET3 Anemia Panel: No results for input(s): VITAMINB12, FOLATE, FERRITIN, TIBC, IRON, RETICCTPCT in the last 72 hours.   PHYSICAL EXAM General: Well developed, well nourished, in no acute distress HEENT:  Normocephalic and atramatic Neck:  No JVD.  Lungs: Clear bilaterally to auscultation and percussion. Heart: HRRR . Normal S1 and S2 without gallops or murmurs.  Abdomen: Bowel sounds  are positive, abdomen soft and non-tender  Msk:  Back normal, normal gait. Normal strength and tone for age. Extremities: No clubbing, cyanosis or edema.   Neuro: Alert and oriented X 3. Psych:  Good affect, responds appropriately  TELEMETRY:Sinus rhythm  ASSESSMENT AND PLAN: Patient was admitted with symptoms of dizziness and atypical chest pain and has on echocardiogram normal wall motion with normal ejection fraction. Clinically patient is doing very well and can be discharged with follow-up in office on Thursday at 2 PM.  Active Problems:   Abnormal EKG    Karra Pink A, MD, [facility] 06/10/2016 11:19 AM

## 2016-06-10 NOTE — Progress Notes (Signed)
Pt. Slept through out the night with no c/o SOB or acute distress noted. C/o pain x1, medicated with effective results. Ran SR throughout the night with HR in 80's.

## 2016-06-10 NOTE — Progress Notes (Signed)
Ciprofloxacin 500 mg bid ordered with CrCl < 30 ml/min. Will decrease ciprofloxacin to 250 mg bid.   Ulice Dash, PharmD Clinical Pharmacist

## 2016-06-10 NOTE — Progress Notes (Signed)
Pt discharged to home via wc.  Instructions given to pt.  RX escribed to [facility] on [address]  Questions answered.  No distress.

## 2016-06-10 NOTE — Discharge Instructions (Signed)
Eat soft /pureed food

## 2016-06-10 NOTE — Discharge Summary (Signed)
[facility] NAME: Kimberly Abbott    MR#:  [id number]  DATE OF BIRTH:  06/09/1921  DATE OF ADMISSION:  06/08/2016 ADMITTING PHYSICIAN: Fritzi Mandes, MD  DATE OF DISCHARGE: 06/10/14  PRIMARY CARE PHYSICIAN: Hoover Brunette, MD    ADMISSION DIAGNOSIS:  Shortness of breath [R06.02] Vertigo [R42] Abnormal EKG [R94.31] Cerebral infarction ([place]) [I63.9] CVA (cerebral infarction) [I63.9] Dysphagia, unspecified type [R13.10]  DISCHARGE DIAGNOSIS:  Acute Vertigo-resolved Acute on chronic dysphagia. Pt has h/o Cricopharyngeal achalasia with w/u done in the past-recommend f/u [facility] HTN SECONDARY DIAGNOSIS:   Past Medical History:  Diagnosis Date  . Allergy   . CKD (chronic kidney disease)   . GERD (gastroesophageal reflux disease)   . Hyperlipidemia   . Hypertension     HOSPITAL COURSE:  Kimberly Abbott a 82 y.o.femaleh/o CKD, GERD, esophageal stricture, hypertension, and hyperlipidemia who presents for evaluation of shortness of breath and dizziness. Patient reports that yesterday she had an episode of lightheadedness. She then felt better and went to bed. When she woke up at 10 PM she started feeling room spinning and also dizzy like she was going to pass out  1.acute onset positional dizziness/vertigo -MRI of the brain negative for stroke -When necessary meclizine - ultrasound Doppler carotid  <50% stenosis of bilateral ICA  - echo done -results pending -Physical therapy recommends HHPT  2. Abnormal EKG with normal troponin and no symptoms of chest pain or shortness of breath -Patient was seen by cardiology -Recommends cardiac catheterization however patient is not keen on getting it done right now she is asymptomatic will hold off on it. -Continue aspirin,Imdur and lisinopril  3. Hypertension -Continue lisinopril  4. DVT prophylaxis subcutaneous Lovenox  5. Acute on Chronic dysphagia-due to known h/o  cricopharyngeal achalasia. -She has h/o esophageal stenosis, tortuous esophagus -pt tolerating soft/pureed diet Seen by GI who recommends f/u GI at [facility] for evaluation. Pt has seen them in the past for this chronic problem.  6. UTI cipro bid for 7 days  Will d/c home  CONSULTS OBTAINED:  Treatment Team:  Dionisio David, MD San Jetty, MD  DRUG ALLERGIES:   Allergies  Allergen Reactions  . Penicillin G Hives  . Penicillins Hives    Has patient had a PCN reaction causing immediate rash, facial/tongue/throat swelling, SOB or lightheadedness with hypotension: No Has patient had a PCN reaction causing severe rash involving mucus membranes or skin necrosis: No Has patient had a PCN reaction that required hospitalization No Has patient had a PCN reaction occurring within the last 10 years: No If all of the above answers are "NO", then may proceed with Cephalosporin use.    DISCHARGE MEDICATIONS:   Current Discharge Medication List    START taking these medications   Details  ciprofloxacin (CIPRO) 250 MG tablet Take 1 tablet (250 mg total) by mouth 2 (two) times daily. Qty: 14 tablet, Refills: 0    feeding supplement, ENSURE ENLIVE, (ENSURE ENLIVE) LIQD Take 237 mLs by mouth 2 (two) times daily between meals. Qty: 237 mL, Refills: 12    isosorbide mononitrate (IMDUR) 30 MG 24 hr tablet Take 1 tablet (30 mg total) by mouth daily. Qty: 30 tablet, Refills: 0    meclizine (ANTIVERT) 12.5 MG tablet Take 1 tablet (12.5 mg total) by mouth 3 (three) times daily as needed for dizziness. Qty: 15 tablet, Refills: 0      CONTINUE these medications which have NOT CHANGED  Details  allopurinol (ZYLOPRIM) 100 MG tablet Take 100 mg by mouth daily.     aspirin 81 MG chewable tablet Chew 1 tablet by mouth daily.    cetirizine (ZYRTEC) 10 MG tablet Take 10 mg by mouth.    Cholecalciferol (VITAMIN D) 2000 UNITS tablet Take 2,000 Units by mouth daily.     clobetasol cream (TEMOVATE)  [id number] % Apply 1 application topically 2 (two) times daily as needed.    Cranberry ([employer] CRANBERRY) 500 MG CAPS Take 1 capsule by mouth daily.     desoximetasone (TOPICORT) 0.05 % cream Apply topically 2 (two) times daily. Qty: 30 g, Refills: 0    ketoconazole (NIZORAL) 2 % cream Apply 1 application topically daily. Qty: 60 g, Refills: 2    lisinopril (PRINIVIL,ZESTRIL) 10 MG tablet Take 1 tablet (10 mg total) by mouth daily. (appt and labs needed please) Qty: 14 tablet, Refills: 0   Associated Diagnoses: HTN, goal below 150/90    Naftifine HCl (NAFTIN) 2 % CREA Apply 1 application topically 1 day or 1 dose. Qty: 60 g, Refills: 2    oxybutynin (DITROPAN) 5 MG tablet Take 1 tablet (5 mg total) by mouth 2 (two) times daily. appt please Qty: 60 tablet, Refills: 0        If you experience worsening of your admission symptoms, develop shortness of breath, life threatening emergency, suicidal or homicidal thoughts you must seek medical attention immediately by calling 911 or calling your MD immediately  if symptoms less severe.  You Must read complete instructions/literature along with all the possible adverse reactions/side effects for all the Medicines you take and that have been prescribed to you. Take any new Medicines after you have completely understood and accept all the possible adverse reactions/side effects.   Please note  You were cared for by a hospitalist during your hospital stay. If you have any questions about your discharge medications or the care you received while you were in the hospital after you are discharged, you can call the unit and asked to speak with the hospitalist on call if the hospitalist that took care of you is not available. Once you are discharged, your primary care physician will handle any further medical issues. Please note that NO REFILLS for any discharge medications will be authorized once you are discharged, as it is imperative that you return to your  primary care physician (or establish a relationship with a primary care physician if you do not have one) for your aftercare needs so that they can reassess your need for medications and monitor your lab values. Today   SUBJECTIVE  Doing ok Pt requesting to go home Ate all of her soft diet today  VITAL SIGNS:  Blood pressure (!) 129/58, pulse 84, temperature 97.7 F (36.5 C), temperature source Oral, resp. rate 17, height 5\' 4"  (1.626 m), weight 54 kg (119 lb), SpO2 100 %.  I/O:   Intake/Output Summary (Last 24 hours) at 06/10/16 0909 Last data filed at 06/10/16 0502  Gross per 24 hour  Intake                0 ml  Output               50 ml  Net              -50 ml    PHYSICAL EXAMINATION:  GENERAL:  82 y.o.-year-old patient lying in the bed with no acute distress.  EYES: Pupils equal, round, reactive to  light and accommodation. No scleral icterus. Extraocular muscles intact.  HEENT: Head atraumatic, normocephalic. Oropharynx and nasopharynx clear.  NECK:  Supple, no jugular venous distention. No thyroid enlargement, no tenderness.  LUNGS: Normal breath sounds bilaterally, no wheezing, rales,rhonchi or crepitation. No use of accessory muscles of respiration.  CARDIOVASCULAR: S1, S2 normal. No murmurs, rubs, or gallops.  ABDOMEN: Soft, non-tender, non-distended. Bowel sounds present. No organomegaly or mass.  EXTREMITIES: No pedal edema, cyanosis, or clubbing.  NEUROLOGIC: Cranial nerves II through XII are intact. Muscle strength 5/5 in all extremities. Sensation intact. Gait not checked.  PSYCHIATRIC: The patient is alert and oriented x 3.  SKIN: No obvious rash, lesion, or ulcer.   DATA REVIEW:   CBC   Recent Labs Lab 06/08/16 0738  WBC 6.6  HGB 12.8  HCT 38.6  PLT 221    Chemistries   Recent Labs Lab 06/08/16 0738  NA 139  K 4.5  CL 106  CO2 27  GLUCOSE 100*  BUN 39*  CREATININE 1.21*  CALCIUM 10.0    Microbiology Results   No results found for this  or any previous visit (from the past 240 hour(s)).  RADIOLOGY:  Mr Brain Wo Contrast  Result Date: 06/08/2016 CLINICAL DATA:  Cerebral infarction.  Dizziness EXAM: MRI HEAD WITHOUT CONTRAST TECHNIQUE: Multiplanar, multiecho pulse sequences of the brain and surrounding structures were obtained without intravenous contrast. COMPARISON:  CT head of 06/08/2016 FINDINGS: Brain: Generalized atrophy. Negative for hydrocephalus. Negative for acute infarct. Moderate chronic microvascular ischemic change in the white matter. Brainstem and cerebellum intact. Negative for hemorrhage or mass. No shift of the midline structures. Pituitary not enlarged. Vascular: Normal arterial flow voids Skull and upper cervical spine: Degenerative changes C1-C2. 3 mm anterior listhesis C4-5. No acute skeletal lesion. Sinuses/Orbits: Negative Other: None IMPRESSION: Atrophy and chronic ischemia.  No acute abnormality. Electronically Signed   By: Franchot Gallo M.D.   On: 06/08/2016 11:06   US Carotid Bilateral  Result Date: 06/08/2016 CLINICAL DATA:  Stroke EXAM: BILATERAL CAROTID DUPLEX ULTRASOUND TECHNIQUE: Pearline Cables scale imaging, color Doppler and duplex ultrasound were performed of bilateral carotid and vertebral arteries in the neck. COMPARISON:  None. FINDINGS: Criteria: Quantification of carotid stenosis is based on velocity parameters that correlate the residual internal carotid diameter with NASCET-based stenosis levels, using the diameter of the distal internal carotid lumen as the denominator for stenosis measurement. The following velocity measurements were obtained: RIGHT ICA:  86 cm/sec CCA:  83 cm/sec SYSTOLIC ICA/CCA RATIO:  0.7 DIASTOLIC ICA/CCA RATIO:  0.9 ECA:  245 cm/sec LEFT ICA:  57 cm/sec CCA:  95 cm/sec SYSTOLIC ICA/CCA RATIO:  0.9 DIASTOLIC ICA/CCA RATIO:  0.8 ECA:  121 cm/sec RIGHT CAROTID ARTERY: Moderate calcified plaque in the upper common carotid and bulb. Low resistance internal carotid Doppler pattern is  preserved. There is extensive plaque at the origin of the external carotid. RIGHT VERTEBRAL ARTERY:  Antegrade. LEFT CAROTID ARTERY: Little if any plaque in the bulb. Low resistance internal carotid Doppler pattern is preserved. There is mild calcified plaque in the lower internal carotid. LEFT VERTEBRAL ARTERY:  Antegrade. IMPRESSION: Less than 50% stenosis in the right and left internal carotid arteries. Electronically Signed   By: Marybelle Killings M.D.   On: 06/08/2016 16:42     Management plans discussed with the patient, family and they are in agreement.  CODE STATUS:     Code Status Orders        Start     Ordered  06/08/16 1138  Full code  Continuous     06/08/16 1137    Code Status History    Date Active Date Inactive Code Status Order ID Comments User Context   This patient has a current code status but no historical code status.      TOTAL TIME TAKING CARE OF THIS PATIENT: 40  minutes.    Jenisha Faison M.D on 06/10/2016 at 9:09 AM  Between 7am to 6pm - Pager - [phone] After 6pm go to [email or web address] - password [email or web address] [facility]  Office  [phone]  CC: Primary care physician; Hoover Brunette, MD

## 2016-06-10 NOTE — Care Management Note (Signed)
Case Management Note  Patient Details  Name: Marvelle Morrell MRN: [id number] Date of Birth: 1920/12/05  Subjective/Objective:    A referral for home health PT was called to today's on-call nurse at [facility].               Action/Plan:   Expected Discharge Date:                  Expected Discharge Plan:     In-House Referral:     Discharge planning Services     Post Acute Care Choice:    Choice offered to:     DME Arranged:    DME Agency:     HH Arranged:    HH Agency:     Status of Service:     If discussed at [employer] of Stay Meetings, dates discussed:    Additional Comments:  Cherelle Midkiff A, RN 06/10/2016, 11:17 AM

## 2016-06-14 DIAGNOSIS — R9431 Abnormal electrocardiogram [ECG] [EKG]: Secondary | ICD-10-CM | POA: Diagnosis not present

## 2016-06-14 DIAGNOSIS — E782 Mixed hyperlipidemia: Secondary | ICD-10-CM | POA: Diagnosis not present

## 2016-06-14 DIAGNOSIS — R0602 Shortness of breath: Secondary | ICD-10-CM | POA: Diagnosis not present

## 2016-06-14 DIAGNOSIS — R55 Syncope and collapse: Secondary | ICD-10-CM | POA: Diagnosis not present

## 2016-06-14 DIAGNOSIS — I10 Essential (primary) hypertension: Secondary | ICD-10-CM | POA: Diagnosis not present

## 2016-06-19 DIAGNOSIS — R072 Precordial pain: Secondary | ICD-10-CM | POA: Diagnosis not present

## 2016-06-19 DIAGNOSIS — R9431 Abnormal electrocardiogram [ECG] [EKG]: Secondary | ICD-10-CM | POA: Diagnosis not present

## 2016-06-26 DIAGNOSIS — I2510 Atherosclerotic heart disease of native coronary artery without angina pectoris: Secondary | ICD-10-CM | POA: Diagnosis not present

## 2016-06-26 DIAGNOSIS — I10 Essential (primary) hypertension: Secondary | ICD-10-CM | POA: Diagnosis not present

## 2016-06-26 DIAGNOSIS — E782 Mixed hyperlipidemia: Secondary | ICD-10-CM | POA: Diagnosis not present

## 2016-06-26 DIAGNOSIS — R42 Dizziness and giddiness: Secondary | ICD-10-CM | POA: Diagnosis not present

## 2016-06-28 DIAGNOSIS — H8112 Benign paroxysmal vertigo, left ear: Secondary | ICD-10-CM | POA: Diagnosis not present

## 2016-07-06 DIAGNOSIS — H8113 Benign paroxysmal vertigo, bilateral: Secondary | ICD-10-CM | POA: Diagnosis not present

## 2016-07-19 ENCOUNTER — Ambulatory Visit (INDEPENDENT_AMBULATORY_CARE_PROVIDER_SITE_OTHER): Payer: Medicare HMO | Admitting: Podiatry

## 2016-07-19 DIAGNOSIS — B351 Tinea unguium: Secondary | ICD-10-CM | POA: Diagnosis not present

## 2016-07-19 DIAGNOSIS — M79676 Pain in unspecified toe(s): Secondary | ICD-10-CM | POA: Diagnosis not present

## 2016-07-19 MED ORDER — CLOTRIMAZOLE-BETAMETHASONE 1-0.05 % EX CREA: 1.0000 "application " | TOPICAL_CREAM | Freq: Two times a day (BID) | CUTANEOUS | 0 refills | Status: DC

## 2016-07-20 ENCOUNTER — Ambulatory Visit: Payer: Medicare HMO | Admitting: Podiatry

## 2016-07-20 NOTE — Progress Notes (Signed)
Patient ID: Kimberly Abbott, female   DOB: March 14, 1921, 82 y.o.   MRN: [id number]  Subjective: 82 y.o.-year-old female  returns the office today for painful, elongated, thickened toenails and for the nails continuing to ingrow especially to her big toenails. She denies any redness or drainage from the nail sites.  She is asking for a refill of the lotrisone. She states that the rash on her right foot is much improved with the Lamisil. She states the orthotics are "OK".  Denies any systemic complaints such as fevers, chills, nausea, vomiting.   Objective: AAO 3, NAD DP/PT pulses palpable, CRT less than 3 seconds Nails hypertrophic, dystrophic, elongated, brittle, discolored 10. There is tenderness overlying these nails 1-5 bilaterally. There is no surrounding erythema or drainage along the nail sites. On the plantar aspect of the right foot is minimal dry, peeling skin and is much improved compared to when I last saw her. No drainage or pus today.  No other open lesions or pre-ulcerative lesions are identified. No other areas of tenderness bilateral lower extremities. No overlying edema, erythema, increased warmth. No pain with calf compression, swelling, warmth, erythema.  Assessment: Patient presents with symptomatic onychomycosis; right foot skin rash which has improved.   Plan: -Treatment options including alternatives, risks, complications were discussed -Nails sharply debrided 10 without complication/bleeding. -Refilled lostrisone -Discussed daily foot inspection. If there are any changes, to call the office immediately.  -Follow-up in 3 months or sooner if any problems are to arise. In the meantime, encouraged to call the office with any questions, concerns, changes symptoms.  Celesta Gentile, DPM

## 2016-07-27 DIAGNOSIS — I2510 Atherosclerotic heart disease of native coronary artery without angina pectoris: Secondary | ICD-10-CM | POA: Diagnosis not present

## 2016-07-27 DIAGNOSIS — I10 Essential (primary) hypertension: Secondary | ICD-10-CM | POA: Diagnosis not present

## 2016-07-27 DIAGNOSIS — E782 Mixed hyperlipidemia: Secondary | ICD-10-CM | POA: Diagnosis not present

## 2016-07-27 DIAGNOSIS — R0602 Shortness of breath: Secondary | ICD-10-CM | POA: Diagnosis not present

## 2016-08-03 DIAGNOSIS — B353 Tinea pedis: Secondary | ICD-10-CM | POA: Diagnosis not present

## 2016-08-03 DIAGNOSIS — R1310 Dysphagia, unspecified: Secondary | ICD-10-CM | POA: Diagnosis not present

## 2016-08-03 DIAGNOSIS — K449 Diaphragmatic hernia without obstruction or gangrene: Secondary | ICD-10-CM | POA: Diagnosis not present

## 2016-08-03 DIAGNOSIS — L309 Dermatitis, unspecified: Secondary | ICD-10-CM | POA: Diagnosis not present

## 2016-08-27 DIAGNOSIS — R002 Palpitations: Secondary | ICD-10-CM | POA: Diagnosis not present

## 2016-08-27 DIAGNOSIS — I10 Essential (primary) hypertension: Secondary | ICD-10-CM | POA: Diagnosis not present

## 2016-08-27 DIAGNOSIS — R0602 Shortness of breath: Secondary | ICD-10-CM | POA: Diagnosis not present

## 2016-09-05 ENCOUNTER — Emergency Department: Payer: Medicare HMO

## 2016-09-05 ENCOUNTER — Emergency Department
Admission: EM | Admit: 2016-09-05 | Discharge: 2016-09-05 | Disposition: A | Discharge status: Home / Self Care | Payer: Medicare HMO | Attending: Student in an Organized Health Care Education/Training Program | Admitting: Student in an Organized Health Care Education/Training Program

## 2016-09-05 ENCOUNTER — Encounter: Payer: Self-pay | Admitting: *Deleted

## 2016-09-05 DIAGNOSIS — R6889 Other general symptoms and signs: Secondary | ICD-10-CM | POA: Diagnosis not present

## 2016-09-05 DIAGNOSIS — Z7982 Long term (current) use of aspirin: Secondary | ICD-10-CM | POA: Insufficient documentation

## 2016-09-05 DIAGNOSIS — Z79899 Other long term (current) drug therapy: Secondary | ICD-10-CM | POA: Insufficient documentation

## 2016-09-05 DIAGNOSIS — I129 Hypertensive chronic kidney disease with stage 1 through stage 4 chronic kidney disease, or unspecified chronic kidney disease: Secondary | ICD-10-CM | POA: Insufficient documentation

## 2016-09-05 DIAGNOSIS — N183 Chronic kidney disease, stage 3 (moderate): Secondary | ICD-10-CM | POA: Insufficient documentation

## 2016-09-05 DIAGNOSIS — R0600 Dyspnea, unspecified: Secondary | ICD-10-CM

## 2016-09-05 DIAGNOSIS — R0602 Shortness of breath: Secondary | ICD-10-CM | POA: Insufficient documentation

## 2016-09-05 DIAGNOSIS — R0789 Other chest pain: Secondary | ICD-10-CM | POA: Diagnosis not present

## 2016-09-05 DIAGNOSIS — R531 Weakness: Secondary | ICD-10-CM | POA: Diagnosis not present

## 2016-09-05 LAB — COMPREHENSIVE METABOLIC PANEL
ALBUMIN: 4.1 g/dL (ref 3.5–5.0)
ALT: 14 U/L (ref 14–54)
AST: 22 U/L (ref 15–41)
Alkaline Phosphatase: 98 U/L (ref 38–126)
Anion gap: 5 (ref 5–15)
BUN: 50 mg/dL — ABNORMAL HIGH (ref 6–20)
CO2: 29 mmol/L (ref 22–32)
CREATININE: 1.69 mg/dL — AB (ref 0.44–1.00)
Calcium: 9.6 mg/dL (ref 8.9–10.3)
Chloride: 103 mmol/L (ref 101–111)
GFR calc non Af Amer: 25 mL/min — ABNORMAL LOW (ref >60)
GFR, EST AFRICAN AMERICAN: 29 mL/min — AB (ref >60)
GLUCOSE: 110 mg/dL — AB (ref 65–99)
Potassium: 4.9 mmol/L (ref 3.5–5.1)
SODIUM: 137 mmol/L (ref 135–145)
Total Bilirubin: 0.5 mg/dL (ref 0.3–1.2)
Total Protein: 7.1 g/dL (ref 6.5–8.1)

## 2016-09-05 LAB — CBC WITH DIFFERENTIAL/PLATELET
BASOS PCT: 1 %
Basophils Absolute: 0 10*3/uL (ref 0–0.1)
EOS ABS: 0.2 10*3/uL (ref 0–0.7)
EOS PCT: 3 %
HCT: 34.9 % — ABNORMAL LOW (ref 35.0–47.0)
Hemoglobin: 11.6 g/dL — ABNORMAL LOW (ref 12.0–16.0)
Lymphocytes Relative: 9 %
Lymphs Abs: 0.7 10*3/uL — ABNORMAL LOW (ref 1.0–3.6)
MCH: 29.7 pg (ref 26.0–34.0)
MCHC: 33.2 g/dL (ref 32.0–36.0)
MCV: 89.3 fL (ref 80.0–100.0)
Monocytes Absolute: 0.4 10*3/uL (ref 0.2–0.9)
Monocytes Relative: 5 %
NEUTROS PCT: 84 %
Neutro Abs: 7.2 10*3/uL — ABNORMAL HIGH (ref 1.4–6.5)
Platelets: 182 10*3/uL (ref 150–440)
RBC: 3.91 MIL/uL (ref 3.80–5.20)
RDW: 14.9 % — ABNORMAL HIGH (ref 11.5–14.5)
WBC: 8.6 10*3/uL (ref 3.6–11.0)

## 2016-09-05 LAB — BRAIN NATRIURETIC PEPTIDE: B NATRIURETIC PEPTIDE 5: 273 pg/mL — AB (ref 0.0–100.0)

## 2016-09-05 LAB — TROPONIN I: Troponin I: 0.03 ng/mL (ref <0.03)

## 2016-09-05 MED ORDER — IPRATROPIUM-ALBUTEROL 0.5-2.5 (3) MG/3ML IN SOLN
3.0000 mL | Freq: Once | RESPIRATORY_TRACT | Status: DC
  Filled 2016-09-05: qty 3

## 2016-09-05 MED ORDER — IPRATROPIUM-ALBUTEROL 0.5-2.5 (3) MG/3ML IN SOLN
3.0000 mL | Freq: Once | RESPIRATORY_TRACT | Status: AC
  Administered 2016-09-05: 3 mL via RESPIRATORY_TRACT
  Filled 2016-09-05: qty 3

## 2016-09-05 MED ORDER — DIAZEPAM 2 MG PO TABS: 2.0000 mg | ORAL_TABLET | Freq: Two times a day (BID) | ORAL | 0 refills | Status: DC | PRN

## 2016-09-05 MED ORDER — LORAZEPAM 1 MG PO TABS
1.0000 mg | ORAL_TABLET | Freq: Once | ORAL | Status: AC
  Administered 2016-09-05: 1 mg via ORAL
  Filled 2016-09-05: qty 1

## 2016-09-05 MED ORDER — ALBUTEROL SULFATE (2.5 MG/3ML) 0.083% IN NEBU
5.0000 mg | INHALATION_SOLUTION | Freq: Once | RESPIRATORY_TRACT | Status: AC
  Administered 2016-09-05: 5 mg via RESPIRATORY_TRACT
  Filled 2016-09-05: qty 6

## 2016-09-05 NOTE — ED Notes (Signed)
Pt removed from 3 L oxygen per MD verbal order

## 2016-09-05 NOTE — ED Notes (Signed)
Patient transported to CT 

## 2016-09-05 NOTE — ED Triage Notes (Signed)
[place] after reported onset of SOB after climbing a hill. Pt reports feeling increased fatigue today but denies CP at this time. Per EMS pt was 89% on RA. Pt is 94% on RA upon arrival. Pt does not wear oxygen at home. Hx of heart problems and renal failure per EMS. Pt is alert and oriented x 4 at this time.

## 2016-09-05 NOTE — ED Notes (Addendum)
While ambulating pts HR elevated to 136 bpm. Pt reported feeling dizzy upon standing. Pt was able to ambulate with one assist without difficulty. Upon standing pts oxygen maintained at 99% after ambulating for 5 minutes pt had an oxygen saturation of 98% on RA

## 2016-09-05 NOTE — ED Provider Notes (Signed)
[facility] Emergency Department Provider Note    First MD Initiated Contact with Patient 09/05/16 1251     (approximate)  I have reviewed the triage vital signs and the nursing notes.   HISTORY  Chief Complaint Shortness of Breath    HPI Kimberly Abbott is a 83 y.o. female presents with chief complaint of shortness of breath after climbing steroid and a church today. Denies any chest pain. Patient does not wear home oxygen. Reportedly 89% on room air. Has a history of kidney disease as well as heart failure. States she recently moved to [place] slipping with family. No nausea or vomiting. Does feel very short of breath. Feels very anxious.  It's "doctors give me something to make me go to sleep."     Past Medical History:  Diagnosis Date  . Allergy   . CKD (chronic kidney disease)   . GERD (gastroesophageal reflux disease)   . Hyperlipidemia   . Hypertension    History reviewed. No pertinent family history. Past Surgical History:  Procedure Laterality Date  . APPENDECTOMY    . rectal fistula  in the 40's  . TUBAL LIGATION     Patient Active Problem List   Diagnosis Date Noted  . Abnormal EKG 06/08/2016  . Allergic rhinitis 12/31/2014  . Anxiety 12/31/2014  . Chronic gouty arthropathy with tophus (tophi) 12/31/2014  . Dermatitis, eczematoid 12/31/2014  . Heart & renal disease, hypertensive, with heart failure ([place]) 12/31/2014  . Routine general medical examination at a health care facility 12/31/2014  . Mixed incontinence 12/31/2014  . Chronic urinary tract infection 12/31/2014  . Left shoulder pain 12/31/2014  . Hypercholesteremia 11/08/2014  . Shortness of breath at rest 11/08/2014  . Anemia 11/08/2014  . Contact dermatitis 11/08/2014  . Urinary tract infection 11/08/2014  . Depression 11/08/2014  . Hypertensive kidney disease with chronic kidney disease stage III 11/08/2014  . Allergic rhinitis, mild 11/08/2014  . Gastroesophageal  reflux disease without esophagitis 11/08/2014  . Mixed urge and stress incontinence 11/08/2014  . At moderate risk for fall 11/08/2014  . Esophageal dysphagia 11/08/2014  . Rotator cuff syndrome of right shoulder 11/08/2014  . Dermatosis 11/08/2014  . Acute anxiety 11/08/2014  . 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 Protein-calorie malnutrition ([place]) 11/08/2014  . Neck pain, chronic 11/08/2014  . HTN, goal below 150/90 11/08/2014  . Arthritis 11/08/2014      Prior to Admission medications   Medication Sig Start Date End Date Taking? Authorizing Provider  clotrimazole-betamethasone (LOTRISONE) cream Apply 1 application topically 2 (two) times daily. 07/19/16  Yes Trula Slade, DPM  desoximetasone (TOPICORT) 0.05 % cream Apply topically 2 (two) times daily. 05/07/16  Yes Trula Slade, DPM  allopurinol (ZYLOPRIM) 100 MG tablet Take 100 mg by mouth daily.     Historical Provider, MD  aspirin 81 MG chewable tablet Chew 1 tablet by mouth daily.    Historical Provider, MD  cetirizine (ZYRTEC) 10 MG tablet Take 10 mg by mouth.    Historical Provider, MD  Cholecalciferol (VITAMIN D) 2000 UNITS tablet Take 2,000 Units by mouth daily.     Historical Provider, MD  clobetasol cream (TEMOVATE) [id number] % Apply 1 application topically 2 (two) times daily as needed. 08/04/14   Historical Provider, MD  Cranberry ([employer] CRANBERRY) 500 MG CAPS Take 1 capsule by mouth daily.     Historical Provider, MD  diazepam (VALIUM) 2 MG tablet Take 1 tablet (2 mg total) by  mouth every 12 (twelve) hours as needed for anxiety. 09/05/16 09/05/17  Merlyn Lot, MD  feeding supplement, ENSURE ENLIVE, (ENSURE ENLIVE) LIQD Take 237 mLs by mouth 2 (two) times daily between meals. 06/10/16   Fritzi Mandes, MD  isosorbide mononitrate (IMDUR) 30 MG 24 hr tablet Take 1 tablet (30 mg total) by mouth daily. 06/10/16   Fritzi Mandes, MD  ketoconazole (NIZORAL) 2 % cream Apply 1 application topically daily. 10/13/15   Trula Slade, DPM  lisinopril (PRINIVIL,ZESTRIL) 10 MG tablet Take 1 tablet (10 mg total) by mouth daily. (appt and labs needed please) 02/06/16   Arnetha Courser, MD  meclizine (ANTIVERT) 12.5 MG tablet Take 1 tablet (12.5 mg total) by mouth 3 (three) times daily as needed for dizziness. 06/10/16   Fritzi Mandes, MD  Naftifine HCl (NAFTIN) 2 % CREA Apply 1 application topically 1 day or 1 dose. 10/21/14   Trula Slade, DPM  oxybutynin (DITROPAN) 5 MG tablet Take 1 tablet (5 mg total) by mouth 2 (two) times daily. appt please 02/06/16   Arnetha Courser, MD    Allergies Penicillin g and Penicillins    Social History Social History  Substance Use Topics  . Smoking status: Never Smoker  . Smokeless tobacco: Never Used  . Alcohol use No    Review of Systems Patient denies headaches, rhinorrhea, blurry vision, numbness, shortness of breath, chest pain, edema, cough, abdominal pain, nausea, vomiting, diarrhea, dysuria, fevers, rashes or hallucinations unless otherwise stated above in HPI. ____________________________________________   PHYSICAL EXAM:  VITAL SIGNS: Vitals:   09/05/16 1433 09/05/16 1500  BP: (!) 144/96 (!) 176/84  Pulse: 65 74  Resp: 17 17  Temp:      Constitutional:tearful and tachypnic Eyes: Conjunctivae are normal. PERRL. EOMI. Head: Atraumatic. Nose: No congestion/rhinnorhea. Mouth/Throat: Mucous membranes are moist.  Oropharynx non-erythematous. Neck: No stridor. Painless ROM. No cervical spine tenderness to palpation Hematological/Lymphatic/Immunilogical: No cervical lymphadenopathy. Cardiovascular: Normal rate, regular rhythm. Grossly normal heart sounds.  Good peripheral circulation. Respiratory: Normal respiratory effort.  No retractions. Lungs CTAB. Gastrointestinal: Soft and nontender. No distention. No abdominal bruits. No CVA tenderness. Genitourinary:  Musculoskeletal: No lower extremity tenderness nor edema.  No joint effusions. Neurologic:  Normal  speech and language. No gross focal neurologic deficits are appreciated. No gait instability. Skin:  Skin is warm, dry and intact. No rash noted. Psychiatric: Mood and affect are normal. Speech and behavior are normal.  ____________________________________________   LABS (all labs ordered are listed, but only abnormal results are displayed)  Results for orders placed or performed during the hospital encounter of 09/05/16 (from the past 24 hour(s))  CBC with Differential/Platelet     Status: Abnormal   Collection Time: 09/05/16  2:15 PM  Result Value Ref Range   WBC 8.6 3.6 - 11.0 K/uL   RBC 3.91 3.80 - 5.20 MIL/uL   Hemoglobin 11.6 (L) 12.0 - 16.0 g/dL   HCT 34.9 (L) 35.0 - 47.0 %   MCV 89.3 80.0 - 100.0 fL   MCH 29.7 26.0 - 34.0 pg   MCHC 33.2 32.0 - 36.0 g/dL   RDW 14.9 (H) 11.5 - 14.5 %   Platelets 182 150 - 440 K/uL   Neutrophils Relative % 84 %   Neutro Abs 7.2 (H) 1.4 - 6.5 K/uL   Lymphocytes Relative 9 %   Lymphs Abs 0.7 (L) 1.0 - 3.6 K/uL   Monocytes Relative 5 %   Monocytes Absolute 0.4 0.2 - 0.9 K/uL  Eosinophils Relative 3 %   Eosinophils Absolute 0.2 0 - 0.7 K/uL   Basophils Relative 1 %   Basophils Absolute 0.0 0 - 0.1 K/uL  Comprehensive metabolic panel     Status: Abnormal   Collection Time: 09/05/16  2:15 PM  Result Value Ref Range   Sodium 137 135 - 145 mmol/L   Potassium 4.9 3.5 - 5.1 mmol/L   Chloride 103 101 - 111 mmol/L   CO2 29 22 - 32 mmol/L   Glucose, Bld 110 (H) 65 - 99 mg/dL   BUN 50 (H) 6 - 20 mg/dL   Creatinine, Ser 1.69 (H) 0.44 - 1.00 mg/dL   Calcium 9.6 8.9 - 10.3 mg/dL   Total Protein 7.1 6.5 - 8.1 g/dL   Albumin 4.1 3.5 - 5.0 g/dL   AST 22 15 - 41 U/L   ALT 14 14 - 54 U/L   Alkaline Phosphatase 98 38 - 126 U/L   Total Bilirubin 0.5 0.3 - 1.2 mg/dL   GFR calc non Af Amer 25 (L) >60 mL/min   GFR calc Af Amer 29 (L) >60 mL/min   Anion gap 5 5 - 15  Troponin I     Status: Abnormal   Collection Time: 09/05/16  2:15 PM  Result Value  Ref Range   Troponin I 0.03 (HH) <0.03 ng/mL  Brain natriuretic peptide     Status: Abnormal   Collection Time: 09/05/16  2:15 PM  Result Value Ref Range   B Natriuretic Peptide 273.0 (H) 0.0 - 100.0 pg/mL   ____________________________________________  EKG My review and personal interpretation at Time: 12:54   Indication: sob  Rate: 75  Rhythm: sinus Axis: normal Other: biphasic t wave changes in V2-V3 with lateral inverions.  Consistent with previous EKG dated 11/10 ____________________________________________  RADIOLOGY  I personally reviewed all radiographic images ordered to evaluate for the above acute complaints and reviewed radiology reports and findings.  These findings were personally discussed with the patient.  Please see medical record for radiology report.  ____________________________________________   PROCEDURES  Procedure(s) performed:  Procedures    Critical Care performed: no ____________________________________________   INITIAL IMPRESSION / ASSESSMENT AND PLAN / ED COURSE  Pertinent labs & imaging results that were available during my care of the patient were reviewed by me and considered in my medical decision making (see chart for details).  DDX: ACS, pericarditis, esophagitis, boerhaaves, pe, dissection, pna, bronchitis, costochondritis   Kimberly Abbott is a 83 y.o. who presents to the ED with SOB and chest discomfort as described above.  Patient very anxious but in no acute respiratory distress.  EKG with concerning EKG abnormalities but it appears consistent with previous.  Denies any chest pain at this time.  Patient without evidence of chf or pna.  Have discussed with the patient and available family all diagnostics and treatments performed thus far and all questions were answered to the best of my ability. The patient demonstrates understanding and agreement with plan.   Clinical Course as of Sep 05 1642  Wed Sep 05, 2016  1629 [place]  reassessed patient. States she is feeling much better and wants to go home. Discussed abnormal labs including abnormal troponin. Patient states that she is aware has follow-up with Dr. Carren Rang.  [PR]    Clinical Course User Index [PR] Merlyn Lot, MD   Had extensive discussion about the risks of being discharged home given her medical comorbidities And uncertain etiology of her symptoms earlier. Patient says that she's  been very stressed out as very unhappy with her living situation. States she wants to go home with her family members. Does states understanding of the risks associated with being discharged home but does agree to come back should her symptoms worsen. She is able to ambulate with no hypoxia and no significant distress. He agrees to follow-up with cardiology.  Have discussed with the patient and available family all diagnostics and treatments performed thus far and all questions were answered to the best of my ability. The patient demonstrates understanding and agreement with plan.   ____________________________________________   FINAL CLINICAL IMPRESSION(S) / ED DIAGNOSES  Final diagnoses:  Dyspnea, unspecified type      NEW MEDICATIONS STARTED DURING THIS VISIT:  New Prescriptions   DIAZEPAM (VALIUM) 2 MG TABLET    Take 1 tablet (2 mg total) by mouth every 12 (twelve) hours as needed for anxiety.     Note:  This document was prepared using Dragon voice recognition software and may include unintentional dictation errors.    Merlyn Lot, MD 09/05/16 [phone]

## 2016-09-05 NOTE — ED Notes (Signed)
MD at bedside. 

## 2016-09-05 NOTE — ED Notes (Signed)
Family instructed pt could eat. Pt given vanilla Ensure shake.

## 2016-09-25 DIAGNOSIS — M25511 Pain in right shoulder: Secondary | ICD-10-CM | POA: Diagnosis not present

## 2016-09-25 DIAGNOSIS — Z6821 Body mass index (BMI) 21.0-21.9, adult: Secondary | ICD-10-CM | POA: Diagnosis not present

## 2016-09-25 DIAGNOSIS — H04123 Dry eye syndrome of bilateral lacrimal glands: Secondary | ICD-10-CM | POA: Diagnosis not present

## 2016-09-25 DIAGNOSIS — H9193 Unspecified hearing loss, bilateral: Secondary | ICD-10-CM | POA: Diagnosis not present

## 2016-09-25 DIAGNOSIS — I10 Essential (primary) hypertension: Secondary | ICD-10-CM | POA: Diagnosis not present

## 2016-09-25 DIAGNOSIS — M1A9XX0 Chronic gout, unspecified, without tophus (tophi): Secondary | ICD-10-CM | POA: Diagnosis not present

## 2016-09-25 DIAGNOSIS — R011 Cardiac murmur, unspecified: Secondary | ICD-10-CM | POA: Diagnosis not present

## 2016-09-25 DIAGNOSIS — M25512 Pain in left shoulder: Secondary | ICD-10-CM | POA: Diagnosis not present

## 2016-09-25 DIAGNOSIS — N3281 Overactive bladder: Secondary | ICD-10-CM | POA: Diagnosis not present

## 2016-09-25 DIAGNOSIS — J302 Other seasonal allergic rhinitis: Secondary | ICD-10-CM | POA: Diagnosis not present

## 2016-10-05 DIAGNOSIS — G4700 Insomnia, unspecified: Secondary | ICD-10-CM | POA: Diagnosis not present

## 2016-10-05 DIAGNOSIS — N189 Chronic kidney disease, unspecified: Secondary | ICD-10-CM | POA: Diagnosis not present

## 2016-10-05 DIAGNOSIS — I10 Essential (primary) hypertension: Secondary | ICD-10-CM | POA: Diagnosis not present

## 2016-10-05 DIAGNOSIS — R69 Illness, unspecified: Secondary | ICD-10-CM | POA: Diagnosis not present

## 2016-10-12 DIAGNOSIS — R0602 Shortness of breath: Secondary | ICD-10-CM | POA: Diagnosis not present

## 2016-10-12 DIAGNOSIS — I10 Essential (primary) hypertension: Secondary | ICD-10-CM | POA: Diagnosis not present

## 2016-10-12 DIAGNOSIS — E782 Mixed hyperlipidemia: Secondary | ICD-10-CM | POA: Diagnosis not present

## 2016-10-26 ENCOUNTER — Ambulatory Visit (INDEPENDENT_AMBULATORY_CARE_PROVIDER_SITE_OTHER): Payer: Medicare HMO | Admitting: Podiatry

## 2016-10-26 DIAGNOSIS — B351 Tinea unguium: Secondary | ICD-10-CM | POA: Diagnosis not present

## 2016-10-26 DIAGNOSIS — L608 Other nail disorders: Secondary | ICD-10-CM

## 2016-10-26 DIAGNOSIS — M79609 Pain in unspecified limb: Secondary | ICD-10-CM | POA: Diagnosis not present

## 2016-10-26 DIAGNOSIS — M79676 Pain in unspecified toe(s): Secondary | ICD-10-CM

## 2016-10-26 DIAGNOSIS — L603 Nail dystrophy: Secondary | ICD-10-CM | POA: Diagnosis not present

## 2016-10-26 MED ORDER — NAFTIFINE HCL 2 % EX CREA: 1.0000 "application " | TOPICAL_CREAM | CUTANEOUS | 2 refills | Status: DC

## 2016-10-26 NOTE — Progress Notes (Signed)
   SUBJECTIVE Patient  presents to office today complaining of elongated, thickened nails. Pain while ambulating in shoes. Patient is unable to trim their own nails.  Patient also complains of pustular lesions to the plantar aspect of the right foot. She says that the topical antifungal has helped tremendously with the appearance of the lesions.  OBJECTIVE General Patient is awake, alert, and oriented x 3 and in no acute distress. Derm Skin is dry and supple bilateral. Negative open lesions or macerations. Remaining integument unremarkable. Nails are tender, long, thickened and dystrophic with subungual debris, consistent with onychomycosis, 1-5 bilateral. No signs of infection noted. Small pustular lesions approximately 1-2 millimeters in diameter noted to the weightbearing surfaces the right foot. Vasc  DP and PT pedal pulses palpable bilaterally. Temperature gradient within normal limits.  Neuro Epicritic and protective threshold sensation diminished bilaterally.  Musculoskeletal Exam No symptomatic pedal deformities noted bilateral. Muscular strength within normal limits.  ASSESSMENT 1. Onychodystrophic nails 1-5 bilateral with hyperkeratosis of nails.  2. Onychomycosis of nail due to dermatophyte bilateral 3. Pustular lesions right plantar foot possibly consistent with vesicular tinea pedis  PLAN OF CARE 1. Patient evaluated today.  2. Instructed to maintain good pedal hygiene and foot care.  3. Mechanical debridement of nails 1-5 bilaterally performed using a nail nipper. Filed with dremel without incident.  4. Refill prescription for Naftin antifungal cream 5. Return to clinic in 3 mos.    Edrick Kins, DPM [facility]  Dr. Edrick Kins, [address]                                        [place], [place] [address]                Office [phone]  Fax [phone]

## 2016-10-30 DIAGNOSIS — E782 Mixed hyperlipidemia: Secondary | ICD-10-CM | POA: Diagnosis not present

## 2016-10-30 DIAGNOSIS — I10 Essential (primary) hypertension: Secondary | ICD-10-CM | POA: Diagnosis not present

## 2016-10-30 DIAGNOSIS — R0602 Shortness of breath: Secondary | ICD-10-CM | POA: Diagnosis not present

## 2016-10-30 DIAGNOSIS — R002 Palpitations: Secondary | ICD-10-CM | POA: Diagnosis not present

## 2016-11-15 DIAGNOSIS — S40021A Contusion of right upper arm, initial encounter: Secondary | ICD-10-CM | POA: Diagnosis not present

## 2016-12-07 DIAGNOSIS — R0602 Shortness of breath: Secondary | ICD-10-CM | POA: Diagnosis not present

## 2016-12-07 DIAGNOSIS — D134 Benign neoplasm of liver: Secondary | ICD-10-CM | POA: Diagnosis not present

## 2016-12-07 DIAGNOSIS — I10 Essential (primary) hypertension: Secondary | ICD-10-CM | POA: Diagnosis not present

## 2016-12-07 DIAGNOSIS — E782 Mixed hyperlipidemia: Secondary | ICD-10-CM | POA: Diagnosis not present

## 2016-12-28 ENCOUNTER — Ambulatory Visit: Payer: Medicare HMO | Admitting: Podiatry

## 2017-01-01 ENCOUNTER — Ambulatory Visit (INDEPENDENT_AMBULATORY_CARE_PROVIDER_SITE_OTHER): Payer: Medicare HMO | Admitting: Podiatry

## 2017-01-01 DIAGNOSIS — M79676 Pain in unspecified toe(s): Secondary | ICD-10-CM

## 2017-01-01 DIAGNOSIS — B351 Tinea unguium: Secondary | ICD-10-CM | POA: Diagnosis not present

## 2017-01-02 NOTE — Progress Notes (Signed)
   SUBJECTIVE Patient  presents to office today complaining of elongated, thickened nails. Pain while ambulating in shoes. Patient is unable to trim their own nails.   OBJECTIVE General Patient is awake, alert, and oriented x 3 and in no acute distress. Derm Skin is dry and supple bilateral. Negative open lesions or macerations. Remaining integument unremarkable. Nails are tender, long, thickened and dystrophic with subungual debris, consistent with onychomycosis, 1-5 bilateral. No signs of infection noted. Vasc  DP and PT pedal pulses palpable bilaterally. Temperature gradient within normal limits.  Neuro Epicritic and protective threshold sensation diminished bilaterally.  Musculoskeletal Exam No symptomatic pedal deformities noted bilateral. Muscular strength within normal limits.  ASSESSMENT 1. Onychodystrophic nails 1-5 bilateral with hyperkeratosis of nails.  2. Onychomycosis of nail due to dermatophyte bilateral 3. Pain in foot bilateral  PLAN OF CARE 1. Patient evaluated today.  2. Instructed to maintain good pedal hygiene and foot care.  3. Mechanical debridement of nails 1-5 bilaterally performed using a nail nipper. Filed with dremel without incident.  4. Return to clinic in 3 mos.    Teneil Shiller M. Jamisyn Langer, DPM [facility]  Dr. Cayden Rautio M. Rifky Lapre, DPM    [address]                                        [place], [place] [address]                Office [phone]  Fax [phone]      

## 2017-01-09 DIAGNOSIS — N898 Other specified noninflammatory disorders of vagina: Secondary | ICD-10-CM | POA: Diagnosis not present

## 2017-01-28 ENCOUNTER — Ambulatory Visit (INDEPENDENT_AMBULATORY_CARE_PROVIDER_SITE_OTHER): Payer: Medicare HMO | Admitting: Family Medicine

## 2017-01-28 ENCOUNTER — Encounter: Payer: Self-pay | Admitting: Family Medicine

## 2017-01-28 VITALS — BP 116/78 | HR 58 | Resp 16 | Ht 59.0 in | Wt 114.0 lb

## 2017-01-28 DIAGNOSIS — Z23 Encounter for immunization: Secondary | ICD-10-CM | POA: Diagnosis not present

## 2017-01-28 DIAGNOSIS — R2681 Unsteadiness on feet: Secondary | ICD-10-CM | POA: Diagnosis not present

## 2017-01-28 DIAGNOSIS — G309 Alzheimer's disease, unspecified: Secondary | ICD-10-CM | POA: Diagnosis not present

## 2017-01-28 DIAGNOSIS — J309 Allergic rhinitis, unspecified: Secondary | ICD-10-CM

## 2017-01-28 DIAGNOSIS — I10 Essential (primary) hypertension: Secondary | ICD-10-CM

## 2017-01-28 DIAGNOSIS — E559 Vitamin D deficiency, unspecified: Secondary | ICD-10-CM | POA: Diagnosis not present

## 2017-01-28 DIAGNOSIS — N183 Chronic kidney disease, stage 3 unspecified: Secondary | ICD-10-CM

## 2017-01-28 DIAGNOSIS — N3946 Mixed incontinence: Secondary | ICD-10-CM

## 2017-01-28 DIAGNOSIS — R634 Abnormal weight loss: Secondary | ICD-10-CM

## 2017-01-28 DIAGNOSIS — N3000 Acute cystitis without hematuria: Secondary | ICD-10-CM

## 2017-01-28 DIAGNOSIS — L0232 Furuncle of buttock: Secondary | ICD-10-CM

## 2017-01-28 DIAGNOSIS — F0280 Dementia in other diseases classified elsewhere without behavioral disturbance: Secondary | ICD-10-CM

## 2017-01-28 DIAGNOSIS — F32A Depression, unspecified: Secondary | ICD-10-CM

## 2017-01-28 DIAGNOSIS — K220 Achalasia of cardia: Secondary | ICD-10-CM | POA: Diagnosis not present

## 2017-01-28 DIAGNOSIS — R69 Illness, unspecified: Secondary | ICD-10-CM | POA: Diagnosis not present

## 2017-01-28 DIAGNOSIS — E785 Hyperlipidemia, unspecified: Secondary | ICD-10-CM | POA: Diagnosis not present

## 2017-01-28 DIAGNOSIS — M1A9XX0 Chronic gout, unspecified, without tophus (tophi): Secondary | ICD-10-CM

## 2017-01-28 DIAGNOSIS — F329 Major depressive disorder, single episode, unspecified: Secondary | ICD-10-CM

## 2017-01-28 DIAGNOSIS — M109 Gout, unspecified: Secondary | ICD-10-CM | POA: Insufficient documentation

## 2017-01-28 MED ORDER — AMLODIPINE BESYLATE 5 MG PO TABS: 5.0000 mg | ORAL_TABLET | Freq: Every day | ORAL | 3 refills | Status: DC

## 2017-01-29 ENCOUNTER — Encounter: Payer: Self-pay | Admitting: Family Medicine

## 2017-01-29 ENCOUNTER — Telehealth: Payer: Self-pay

## 2017-01-29 DIAGNOSIS — K449 Diaphragmatic hernia without obstruction or gangrene: Secondary | ICD-10-CM | POA: Insufficient documentation

## 2017-01-29 DIAGNOSIS — E559 Vitamin D deficiency, unspecified: Secondary | ICD-10-CM | POA: Insufficient documentation

## 2017-01-29 DIAGNOSIS — Z8719 Personal history of other diseases of the digestive system: Secondary | ICD-10-CM | POA: Insufficient documentation

## 2017-01-29 DIAGNOSIS — F0390 Unspecified dementia without behavioral disturbance: Secondary | ICD-10-CM | POA: Insufficient documentation

## 2017-01-29 DIAGNOSIS — S93326A Dislocation of tarsometatarsal joint of unspecified foot, initial encounter: Secondary | ICD-10-CM | POA: Insufficient documentation

## 2017-01-29 LAB — CBC
Hematocrit: 32.7 % — ABNORMAL LOW (ref 34.0–46.6)
Hemoglobin: 10.9 g/dL — ABNORMAL LOW (ref 11.1–15.9)
MCH: 28.9 pg (ref 26.6–33.0)
MCHC: 33.3 g/dL (ref 31.5–35.7)
MCV: 87 fL (ref 79–97)
PLATELETS: 237 10*3/uL (ref 150–379)
RBC: 3.77 x10E6/uL (ref 3.77–5.28)
RDW: 14.8 % (ref 12.3–15.4)
WBC: 6.9 10*3/uL (ref 3.4–10.8)

## 2017-01-29 LAB — VITAMIN B12: Vitamin B-12: 638 pg/mL (ref 232–1245)

## 2017-01-29 LAB — TSH: TSH: 1.21 u[IU]/mL (ref 0.450–4.500)

## 2017-01-29 LAB — COMPREHENSIVE METABOLIC PANEL
A/G RATIO: 1.6 (ref 1.2–2.2)
ALBUMIN: 4.2 g/dL (ref 3.2–4.6)
ALK PHOS: 155 IU/L — AB (ref 39–117)
ALT: 14 IU/L (ref 0–32)
AST: 20 IU/L (ref 0–40)
BUN / CREAT RATIO: 27 (ref 12–28)
BUN: 38 mg/dL — ABNORMAL HIGH (ref 10–36)
Bilirubin Total: 0.5 mg/dL (ref 0.0–1.2)
CO2: 22 mmol/L (ref 20–29)
Calcium: 9.7 mg/dL (ref 8.7–10.3)
Chloride: 105 mmol/L (ref 96–106)
Creatinine, Ser: 1.42 mg/dL — ABNORMAL HIGH (ref 0.57–1.00)
GFR calc Af Amer: 37 mL/min/{1.73_m2} — ABNORMAL LOW (ref >59)
GFR calc non Af Amer: 32 mL/min/{1.73_m2} — ABNORMAL LOW (ref >59)
Globulin, Total: 2.6 g/dL (ref 1.5–4.5)
Glucose: 91 mg/dL (ref 65–99)
POTASSIUM: 4.6 mmol/L (ref 3.5–5.2)
SODIUM: 141 mmol/L (ref 134–144)
Total Protein: 6.8 g/dL (ref 6.0–8.5)

## 2017-01-29 LAB — LIPID PANEL
CHOL/HDL RATIO: 2 ratio (ref 0.0–4.4)
Cholesterol, Total: 158 mg/dL (ref 100–199)
HDL: 78 mg/dL (ref >39)
LDL Calculated: 66 mg/dL (ref 0–99)
TRIGLYCERIDES: 69 mg/dL (ref 0–149)
VLDL Cholesterol Cal: 14 mg/dL (ref 5–40)

## 2017-01-29 LAB — URIC ACID: URIC ACID: 5.6 mg/dL (ref 2.5–7.1)

## 2017-01-29 MED ORDER — VITAMIN D 50 MCG (2000 UT) PO CAPS: 1.0000 | ORAL_CAPSULE | Freq: Every day | ORAL | Status: DC

## 2017-01-29 NOTE — Progress Notes (Signed)
Date:  01/28/2017   Name:  Kimberly Abbott   DOB:  1921-04-12   MRN:  [id number]  PCP:  Adline Potter, MD    Chief Complaint: Establish Care; Recurrent Skin Infections (on buttocks- using creams but it will not go away. ); Rash (itching a lot all over. ); Dysphagia (Had upper Gi study for hiadil hernia and she did not do well with the anestesia so no one else will finish the study or help with strtching throat. When she chews the food often can not go down or comes back up. ); and Urinary Frequency (urine had fowl odor Friday and she is afraid it could be UTI. Is already on Doxy from [place] office. )   History of Present Illness:  This is a 83 y.o. female seen for initial visit. Has boil on buttocks x 3 wks, getting slowly better. On doxy x 2d for UTI sxs, also improving. Hx mixed incontinence on Ditropan x yrs, less effective recently. Hospitalized in Nov for SOB and dizziness, MRI showed atrophy and chronic ischemia, saw cardiology, placed on Imdur and lisinopril, later changed to losartan, now on amlodipine/metoprolol. Seen ED again in Feb with neg w/u. Abnormal EKG but no hx stroke/MI. Echo in Nov ok. Hx esophageal stricture, dx'd cricopharyngeal achalsia based on barium swallow, had EGD 02/2016 showing HH,  mild stenosis, and tortuous esophagus, still c/o solid and occ liquid dysphagia, losing weight per daughter despite Ensure, [facility] had no further recs per daughter. Reports decreased appetite x 2 yrs, Remeron not helping.  AR sxs ok on Zrytec. Gout on allopurinol, no recent attacks. CKD3, has seen nephro, dx'd with secondary hyperPTH, supposed to be on vit D supp but not taking. See podiatry regularly. Last saw [facility] in [place], given B`12 shot. C/o constipation, uses enema qod. More forgetful lately, lives alone and manages own medsFather unknown, mother died 35 HTN, DM, heart dz, brother died 34 unknown cause, sister died 52 Alz dementia. Tet/pneumo/shingles status unknown. Needs refill  amlodipine.  Review of Systems:  Review of Systems  Constitutional: Negative for chills and fever.  HENT: Negative for ear pain, sinus pain and sore throat.   Eyes: Negative for pain.  Respiratory: Negative for cough and wheezing.   Cardiovascular: Negative for chest pain and leg swelling.  Gastrointestinal: Negative for abdominal pain, nausea and vomiting.  Endocrine: Negative for polydipsia and polyuria.  Genitourinary: Negative for difficulty urinating and vaginal bleeding.  Musculoskeletal: Negative for joint swelling.  Neurological: Negative for syncope and light-headedness.  Hematological: Negative for adenopathy.  Psychiatric/Behavioral: Negative for agitation.    Patient Active Problem List   Diagnosis Date Noted  . History of esophageal stricture 01/29/2017  . Hiatal hernia 01/29/2017  . Dementia 01/29/2017  . Gait instability 01/28/2017  . Gout 01/28/2017  . Unintentional weight loss 01/28/2017  . Abnormal EKG 06/08/2016  . Primary osteoarthritis of both hands 05/03/2016  . Chronic fatigue 09/01/2015  . Rotator cuff impingement syndrome of left shoulder 09/01/2015  . Pustular psoriasis of palms and soles 05/05/2015  . Appetite impaired 02/10/2015  . Chronic kidney disease, stage III (moderate) 02/10/2015  . Allergic rhinitis 12/31/2014  . Dermatitis, eczematoid 12/31/2014  . Hyperlipidemia 11/08/2014  . Shortness of breath 11/08/2014  . Anemia 11/08/2014  . Recurrent UTI 11/08/2014  . Depression 11/08/2014  . Gastroesophageal reflux disease without esophagitis 11/08/2014  . Mixed urge and stress incontinence 11/08/2014  . At moderate risk for fall 11/08/2014  . Dysphagia 11/08/2014  . Rotator  cuff syndrome of right shoulder 11/08/2014  . Essential hypertension 11/08/2014    Prior to Admission medications   Medication Sig Start Date End Date Taking? Authorizing Provider  allopurinol (ZYLOPRIM) 100 MG tablet Take 100 mg by mouth daily. 05/03/16 05/03/17 Yes  [provider]  amLODipine (NORVASC) 5 MG tablet Take 1 tablet (5 mg total) by mouth daily. 01/28/17  Yes Katielynn Horan, Gwyndolyn Saxon, MD  aspirin EC 81 MG tablet Take 81 mg by mouth daily.   Yes [provider]  atorvastatin (LIPITOR) 40 MG tablet Take 40 mg by mouth daily.   Yes [provider]  cetirizine (ZYRTEC) 10 MG tablet Take 10 mg by mouth daily.   Yes [provider]  Cranberry ([employer] CRANBERRY) 500 MG CAPS Take 1 capsule by mouth daily.    Yes [provider]  doxycycline (DORYX) 150 MG EC tablet Take 150 mg by mouth 2 (two) times daily.   Yes [provider]  feeding supplement, ENSURE ENLIVE, (ENSURE ENLIVE) LIQD Take 237 mLs by mouth 2 (two) times daily between meals. 06/10/16  Yes Fritzi Mandes, MD  metoprolol succinate (TOPROL-XL) 25 MG 24 hr tablet Take 25 mg by mouth daily.   Yes [provider]  mirtazapine (REMERON) 15 MG tablet Take 15 mg by mouth at bedtime. 1/2 tablet at bedtime   Yes [provider]  oxybutynin (DITROPAN) 5 MG tablet Take 5 mg by mouth 2 (two) times daily. 05/03/16  Yes [provider]    Allergies  Allergen Reactions  . Penicillin G Hives  . Penicillins Hives    Has patient had a PCN reaction causing immediate rash, facial/tongue/throat swelling, SOB or lightheadedness with hypotension: No Has patient had a PCN reaction causing severe rash involving mucus membranes or skin necrosis: No Has patient had a PCN reaction that required hospitalization No Has patient had a PCN reaction occurring within the last 10 years: No If all of the above answers are "NO", then may proceed with Cephalosporin use.    Past Surgical History:  Procedure Laterality Date  . APPENDECTOMY    . rectal fistula  in the 40's  . TUBAL LIGATION      Social History  Substance Use Topics  . Smoking status: Former Research scientist (life sciences)  . Smokeless tobacco: Never Used  . Alcohol use No    History reviewed. No pertinent family  history.  Medication list has been reviewed and updated.  Physical Examination: BP 116/78   Pulse (!) 58   Resp 16   Ht _0  (1.499 m)   Wt 114 lb (51.7 kg)   SpO2 98%   BMI 23.03 kg/m   Physical Exam  Constitutional: She appears well-developed and well-nourished.  HENT:  Head: Normocephalic and atraumatic.  Right Ear: External ear normal.  Left Ear: External ear normal.  Nose: Nose normal.  Mouth/Throat: Oropharynx is clear and moist.  TMs clear  Eyes: Conjunctivae and EOM are normal. Pupils are equal, round, and reactive to light. No scleral icterus.  Neck: Neck supple. No thyromegaly present.  Cardiovascular: Normal rate, regular rhythm, normal heart sounds and intact distal pulses.   Pulmonary/Chest: Effort normal and breath sounds normal.  Abdominal: Soft. She exhibits no distension and no mass. There is no tenderness.  Genitourinary:  Genitourinary Comments: Cystic lesion left inner buttock, not inflammed  Musculoskeletal: She exhibits no edema.  Lymphadenopathy:    She has no cervical adenopathy.  Neurological: She is alert. Coordination normal.  Romberg positive, gait unstable SLUMS  13/30  Skin: Skin is warm and dry.  Psychiatric: Her behavior is normal.  Sad affect GDS 8/15  Nursing note and vitals reviewed.   Assessment and Plan:  1. Essential hypertension Well controlled on amlodipine/metoprolol, consider d/c metoprolol due to bradycardia/fatigue and change back to lisinopril/losartan for kidney protection, refill amlodipine for now - Comprehensive Metabolic Panel (CMET) - CBC  2. Cricopharyngeal achalasia With persistent solid dysphagia and weight loss per daughter, consider refer back to [facility]  3. Unintentional weight loss Remeron not helping, consider d/c, cont Ensure bid - TSH  4. Hyperlipidemia, unspecified hyperlipidemia type On Lipitor, unclear indication, consider d/c - Lipid Profile  5. Alzheimer's dementia without behavioral  disturbance, unspecified timing of dementia onset New dx with FH, discuss further next visit, consider memantine trial  6. Depression, unspecified depression type Marginal control on Remeron, on Zoloft in past, sad since husband died 3 yrs ago  47. CKD (chronic kidney disease) stage 3, GFR 30-59 ml/min EGFR 29 in Feb, has seen nephrology, avoiding NSAIDS, consider d/c asa  8. Boil of buttock Resolving on abx, monitor  9. Gait instability Has seen HHPT, continue walker use - B12  10. Chronic gout without tophus, unspecified cause, unspecified site On allopurinol, no recent attacks - Uric acid  11. Allergic rhinitis, unspecified seasonality, unspecified trigger Well controlled on Zyrtec  12. Vitamin D deficiency Should be on supplement per nephrology  13. Acute cystitis without hematuria Improved on doxy, complete course  14. Mixed incontinence Ditropan may be contributing to cognitive issues, consider d/c  15. Need for vaccination for pneumococcus Consider Tdap/Shingrix next visit - Pneumococcal conjugate vaccine 13-valent  Return in about 4 weeks (around 02/25/2017).   One hour spent with pt/daughter over half in counseling  [name]. [facility]  01/29/2017

## 2017-02-19 NOTE — Telephone Encounter (Signed)
Error

## 2017-03-01 ENCOUNTER — Ambulatory Visit (INDEPENDENT_AMBULATORY_CARE_PROVIDER_SITE_OTHER): Payer: Medicare HMO | Admitting: Family Medicine

## 2017-03-01 ENCOUNTER — Encounter: Payer: Self-pay | Admitting: Family Medicine

## 2017-03-01 VITALS — BP 110/77 | HR 89 | Resp 16 | Ht 59.0 in | Wt 113.0 lb

## 2017-03-01 DIAGNOSIS — R634 Abnormal weight loss: Secondary | ICD-10-CM

## 2017-03-01 DIAGNOSIS — G309 Alzheimer's disease, unspecified: Secondary | ICD-10-CM

## 2017-03-01 DIAGNOSIS — E559 Vitamin D deficiency, unspecified: Secondary | ICD-10-CM

## 2017-03-01 DIAGNOSIS — I10 Essential (primary) hypertension: Secondary | ICD-10-CM | POA: Diagnosis not present

## 2017-03-01 DIAGNOSIS — F32A Depression, unspecified: Secondary | ICD-10-CM

## 2017-03-01 DIAGNOSIS — R300 Dysuria: Secondary | ICD-10-CM | POA: Diagnosis not present

## 2017-03-01 DIAGNOSIS — N3001 Acute cystitis with hematuria: Secondary | ICD-10-CM | POA: Diagnosis not present

## 2017-03-01 DIAGNOSIS — M1A9XX0 Chronic gout, unspecified, without tophus (tophi): Secondary | ICD-10-CM

## 2017-03-01 DIAGNOSIS — F329 Major depressive disorder, single episode, unspecified: Secondary | ICD-10-CM

## 2017-03-01 DIAGNOSIS — N3946 Mixed incontinence: Secondary | ICD-10-CM

## 2017-03-01 DIAGNOSIS — R69 Illness, unspecified: Secondary | ICD-10-CM | POA: Diagnosis not present

## 2017-03-01 DIAGNOSIS — F0280 Dementia in other diseases classified elsewhere without behavioral disturbance: Secondary | ICD-10-CM

## 2017-03-01 DIAGNOSIS — E785 Hyperlipidemia, unspecified: Secondary | ICD-10-CM

## 2017-03-01 LAB — POCT URINALYSIS DIPSTICK
Bilirubin, UA: NEGATIVE
Glucose, UA: NEGATIVE
KETONES UA: NEGATIVE
Nitrite, UA: POSITIVE
PH UA: 6 (ref 5.0–8.0)
PROTEIN UA: NEGATIVE
SPEC GRAV UA: 1.02 (ref 1.010–1.025)
Urobilinogen, UA: 0.2 E.U./dL

## 2017-03-01 MED ORDER — VITAMIN D3 50 MCG (2000 UT) PO CAPS: 2000.0000 [IU] | ORAL_CAPSULE | Freq: Every day | ORAL | Status: DC

## 2017-03-01 MED ORDER — SULFAMETHOXAZOLE-TRIMETHOPRIM 800-160 MG PO TABS: 1.0000 | ORAL_TABLET | Freq: Two times a day (BID) | ORAL | 0 refills | Status: DC

## 2017-03-01 NOTE — Progress Notes (Signed)
Date:  03/01/2017   Name:  Kimberly Abbott   DOB:  1920/11/22   MRN:  [id number]  PCP:  Adline Potter, MD    Chief Complaint: Hypertension and Dysuria (Had pain and burning and odor early in week. )   History of Present Illness:  This is a 83 y.o. female seen for one month f/u. Fatigue about the same off metoprolol, has episodes of SOB and heart pounding with activity, improves with rest, echo/EKG in Nov unremarkable, ER visits for similar sxs in Nov and Feb also unremarkable. Still on amlodipine. Achalasia about the same, weight stable. Off Lipitor, daughter asks if lipids need recheck. Memory and mood unchanged from last visit. Never stopped Ditropan because has to run to bathroom q30 mins if does, on for years. No sign AR sxs per daughter. Has not restarted vit D supp, has pill box but not using yet, lives alone and self-administers meds. UTI sxs past 3d, finished doxy rx from last visit.  Review of Systems:  Review of Systems  Constitutional: Negative for chills and fever.  Respiratory: Negative for cough.   Cardiovascular: Negative for chest pain and leg swelling.  Neurological: Negative for syncope and light-headedness.    Patient Active Problem List   Diagnosis Date Noted  . History of esophageal stricture 01/29/2017  . Hiatal hernia 01/29/2017  . Dementia 01/29/2017  . Cricopharyngeal achalasia 01/29/2017  . Vitamin D deficiency 01/29/2017  . Gait instability 01/28/2017  . Gout 01/28/2017  . Unintentional weight loss 01/28/2017  . Abnormal EKG 06/08/2016  . Primary osteoarthritis of both hands 05/03/2016  . Chronic fatigue 09/01/2015  . Rotator cuff impingement syndrome of left shoulder 09/01/2015  . Pustular psoriasis of palms and soles 05/05/2015  . Appetite impaired 02/10/2015  . CKD (chronic kidney disease) stage 3, GFR 30-59 ml/min 02/10/2015  . Allergic rhinitis 12/31/2014  . Dermatitis, eczematoid 12/31/2014  . Hyperlipidemia 11/08/2014  . Shortness of breath  11/08/2014  . Anemia 11/08/2014  . Recurrent UTI 11/08/2014  . Depression 11/08/2014  . Gastroesophageal reflux disease without esophagitis 11/08/2014  . Mixed urge and stress incontinence 11/08/2014  . At moderate risk for fall 11/08/2014  . Dysphagia 11/08/2014  . Rotator cuff syndrome of right shoulder 11/08/2014  . Essential hypertension 11/08/2014    Prior to Admission medications   Medication Sig Start Date End Date Taking? Authorizing Provider  allopurinol (ZYLOPRIM) 100 MG tablet Take 100 mg by mouth daily. 05/03/16 05/03/17 Yes [provider]  amLODipine (NORVASC) 5 MG tablet Take 1 tablet (5 mg total) by mouth daily. 01/28/17  Yes Brissia Delisa, Gwyndolyn Saxon, MD  Cranberry ([employer] CRANBERRY) 500 MG CAPS Take 1 capsule by mouth daily.    Yes [provider]  feeding supplement, ENSURE ENLIVE, (ENSURE ENLIVE) LIQD Take 237 mLs by mouth 2 (two) times daily between meals. 06/10/16  Yes Fritzi Mandes, MD  oxybutynin (DITROPAN) 5 MG tablet Take 5 mg by mouth 2 (two) times daily.    Yes [provider]  Cholecalciferol (VITAMIN D3) 2000 units capsule Take 1 capsule (2,000 Units total) by mouth daily. 03/01/17   Marinda Tyer, Gwyndolyn Saxon, MD  sulfamethoxazole-trimethoprim (BACTRIM DS,SEPTRA DS) 800-160 MG tablet Take 1 tablet by mouth 2 (two) times daily. 03/01/17   Adline Potter, MD    Allergies  Allergen Reactions  . Penicillin G Hives  . Penicillins Hives    Has patient had a PCN reaction causing immediate rash, facial/tongue/throat swelling, SOB or lightheadedness with hypotension: No Has patient had a PCN reaction  causing severe rash involving mucus membranes or skin necrosis: No Has patient had a PCN reaction that required hospitalization No Has patient had a PCN reaction occurring within the last 10 years: No If all of the above answers are "NO", then may proceed with Cephalosporin use.    Past Surgical History:  Procedure Laterality Date  . APPENDECTOMY    . rectal fistula  in  the 40's  . TUBAL LIGATION      Social History  Substance Use Topics  . Smoking status: Former Research scientist (life sciences)  . Smokeless tobacco: Never Used  . Alcohol use No    History reviewed. No pertinent family history.  Medication list has been reviewed and updated.  Physical Examination: BP 110/77   Pulse 89   Resp 16   Ht 4\' 11"  (1.499 m)   Wt 113 lb (51.3 kg)   SpO2 99% Comment: after walking around clinic  BMI 22.82 kg/m   Physical Exam  Constitutional: She appears well-developed and well-nourished.  Seems brighter, less frail today  Cardiovascular: Normal rate, regular rhythm and normal heart sounds.   Pulmonary/Chest: Effort normal and breath sounds normal.  Musculoskeletal: She exhibits no edema.  Neurological: She is alert.  Skin: Skin is warm and dry.  Psychiatric: She has a normal mood and affect. Her behavior is normal.  Nursing note and vitals reviewed.   Assessment and Plan:  1. Acute cystitis with hematuria Bactrim DS bid x 5d, cx sent  2. Dysuria Lg leuk, pos nit, sm blood - Urine Culture - POCT Urinalysis Dipstick  3. Essential hypertension Well controlled off metoprolol, on amlodipine, consider d/c amlodipine as may be contributing to dsyphagia  4. Hyperlipidemia, unspecified hyperlipidemia type Off Lipitor, discussed lack of clear benefit with daughter  5. Mixed urge and stress incontinence Remains on Ditropan, encouraged trial off for day or two to see if dry mouth, memory improve  6. Unintentional weight loss Weight stable, d/c Remeron, cont Ensure  7. Chronic gout without tophus, unspecified cause, unspecified site Uric acid well controlled, cont allourinol  8. Depression, unspecified depression type GDS 8/15 last visit, consider SSRI if sxs worsen off Remeron  9. Alzheimer's dementia without behavioral disturbance, unspecified timing of dementia onset SLUMS 13/30 last visit, consider memantine trial next visit  10. Vitamin D  deficiency Advised restart supplement per nephro   11. Med review D/c asa/Zyrtec as no clear indication for use  Return in about 4 weeks (around 03/29/2017).  [name]. [facility]  03/01/2017

## 2017-03-04 LAB — URINE CULTURE

## 2017-03-04 LAB — PLEASE NOTE

## 2017-03-06 ENCOUNTER — Encounter: Payer: Self-pay | Admitting: *Deleted

## 2017-03-06 ENCOUNTER — Emergency Department: Payer: Medicare HMO

## 2017-03-06 ENCOUNTER — Inpatient Hospital Stay
Admission: EM | Admit: 2017-03-06 | Discharge: 2017-03-20 | DRG: 388 | Disposition: A | Discharge status: Home Health | Payer: Medicare HMO | Attending: Surgery | Admitting: Surgery

## 2017-03-06 DIAGNOSIS — R109 Unspecified abdominal pain: Secondary | ICD-10-CM | POA: Diagnosis not present

## 2017-03-06 DIAGNOSIS — R1011 Right upper quadrant pain: Secondary | ICD-10-CM

## 2017-03-06 DIAGNOSIS — Z90710 Acquired absence of both cervix and uterus: Secondary | ICD-10-CM | POA: Diagnosis not present

## 2017-03-06 DIAGNOSIS — K56609 Unspecified intestinal obstruction, unspecified as to partial versus complete obstruction: Secondary | ICD-10-CM | POA: Diagnosis not present

## 2017-03-06 DIAGNOSIS — Z6822 Body mass index (BMI) 22.0-22.9, adult: Secondary | ICD-10-CM | POA: Diagnosis not present

## 2017-03-06 DIAGNOSIS — Z880 Allergy status to penicillin: Secondary | ICD-10-CM

## 2017-03-06 DIAGNOSIS — R1310 Dysphagia, unspecified: Secondary | ICD-10-CM

## 2017-03-06 DIAGNOSIS — Z9851 Tubal ligation status: Secondary | ICD-10-CM | POA: Diagnosis not present

## 2017-03-06 DIAGNOSIS — K567 Ileus, unspecified: Secondary | ICD-10-CM | POA: Diagnosis not present

## 2017-03-06 DIAGNOSIS — K5651 Intestinal adhesions [bands], with partial obstruction: Secondary | ICD-10-CM | POA: Diagnosis not present

## 2017-03-06 DIAGNOSIS — K0889 Other specified disorders of teeth and supporting structures: Secondary | ICD-10-CM | POA: Diagnosis not present

## 2017-03-06 DIAGNOSIS — D649 Anemia, unspecified: Secondary | ICD-10-CM | POA: Diagnosis present

## 2017-03-06 DIAGNOSIS — E875 Hyperkalemia: Secondary | ICD-10-CM | POA: Diagnosis not present

## 2017-03-06 DIAGNOSIS — I129 Hypertensive chronic kidney disease with stage 1 through stage 4 chronic kidney disease, or unspecified chronic kidney disease: Secondary | ICD-10-CM | POA: Diagnosis present

## 2017-03-06 DIAGNOSIS — K560 Paralytic ileus: Secondary | ICD-10-CM | POA: Diagnosis not present

## 2017-03-06 DIAGNOSIS — J690 Pneumonitis due to inhalation of food and vomit: Secondary | ICD-10-CM | POA: Diagnosis not present

## 2017-03-06 DIAGNOSIS — R531 Weakness: Secondary | ICD-10-CM | POA: Diagnosis not present

## 2017-03-06 DIAGNOSIS — K449 Diaphragmatic hernia without obstruction or gangrene: Secondary | ICD-10-CM | POA: Diagnosis present

## 2017-03-06 DIAGNOSIS — M1990 Unspecified osteoarthritis, unspecified site: Secondary | ICD-10-CM | POA: Diagnosis not present

## 2017-03-06 DIAGNOSIS — E876 Hypokalemia: Secondary | ICD-10-CM | POA: Diagnosis not present

## 2017-03-06 DIAGNOSIS — K219 Gastro-esophageal reflux disease without esophagitis: Secondary | ICD-10-CM | POA: Diagnosis present

## 2017-03-06 DIAGNOSIS — E871 Hypo-osmolality and hyponatremia: Secondary | ICD-10-CM | POA: Diagnosis not present

## 2017-03-06 DIAGNOSIS — R110 Nausea: Secondary | ICD-10-CM | POA: Diagnosis not present

## 2017-03-06 DIAGNOSIS — I10 Essential (primary) hypertension: Secondary | ICD-10-CM | POA: Diagnosis not present

## 2017-03-06 DIAGNOSIS — K5732 Diverticulitis of large intestine without perforation or abscess without bleeding: Secondary | ICD-10-CM | POA: Diagnosis not present

## 2017-03-06 DIAGNOSIS — E43 Unspecified severe protein-calorie malnutrition: Secondary | ICD-10-CM | POA: Insufficient documentation

## 2017-03-06 DIAGNOSIS — Z87891 Personal history of nicotine dependence: Secondary | ICD-10-CM

## 2017-03-06 DIAGNOSIS — N183 Chronic kidney disease, stage 3 (moderate): Secondary | ICD-10-CM | POA: Diagnosis present

## 2017-03-06 DIAGNOSIS — N179 Acute kidney failure, unspecified: Secondary | ICD-10-CM | POA: Diagnosis not present

## 2017-03-06 DIAGNOSIS — K56600 Partial intestinal obstruction, unspecified as to cause: Secondary | ICD-10-CM

## 2017-03-06 DIAGNOSIS — K56699 Other intestinal obstruction unspecified as to partial versus complete obstruction: Secondary | ICD-10-CM | POA: Diagnosis not present

## 2017-03-06 DIAGNOSIS — K224 Dyskinesia of esophagus: Secondary | ICD-10-CM

## 2017-03-06 LAB — CBC WITH DIFFERENTIAL/PLATELET
BASOS PCT: 1 %
Basophils Absolute: 0.1 10*3/uL (ref 0–0.1)
EOS ABS: 0 10*3/uL (ref 0–0.7)
Eosinophils Relative: 0 %
HCT: 35 % (ref 35.0–47.0)
HEMOGLOBIN: 11.8 g/dL — AB (ref 12.0–16.0)
Lymphocytes Relative: 5 %
Lymphs Abs: 0.5 10*3/uL — ABNORMAL LOW (ref 1.0–3.6)
MCH: 29.5 pg (ref 26.0–34.0)
MCHC: 33.8 g/dL (ref 32.0–36.0)
MCV: 87.2 fL (ref 80.0–100.0)
MONOS PCT: 3 %
Monocytes Absolute: 0.3 10*3/uL (ref 0.2–0.9)
NEUTROS PCT: 91 %
Neutro Abs: 9.9 10*3/uL — ABNORMAL HIGH (ref 1.4–6.5)
PLATELETS: 268 10*3/uL (ref 150–440)
RBC: 4.02 MIL/uL (ref 3.80–5.20)
RDW: 16.2 % — ABNORMAL HIGH (ref 11.5–14.5)
WBC: 10.9 10*3/uL (ref 3.6–11.0)

## 2017-03-06 LAB — COMPREHENSIVE METABOLIC PANEL
ALBUMIN: 4.2 g/dL (ref 3.5–5.0)
ALK PHOS: 131 U/L — AB (ref 38–126)
ALT: 16 U/L (ref 14–54)
ANION GAP: 11 (ref 5–15)
AST: 24 U/L (ref 15–41)
BUN: 35 mg/dL — ABNORMAL HIGH (ref 6–20)
CALCIUM: 10 mg/dL (ref 8.9–10.3)
CO2: 21 mmol/L — AB (ref 22–32)
CREATININE: 2.15 mg/dL — AB (ref 0.44–1.00)
Chloride: 103 mmol/L (ref 101–111)
GFR calc Af Amer: 21 mL/min — ABNORMAL LOW (ref >60)
GFR calc non Af Amer: 19 mL/min — ABNORMAL LOW (ref >60)
Glucose, Bld: 112 mg/dL — ABNORMAL HIGH (ref 65–99)
Potassium: 5.2 mmol/L — ABNORMAL HIGH (ref 3.5–5.1)
SODIUM: 135 mmol/L (ref 135–145)
TOTAL PROTEIN: 7.6 g/dL (ref 6.5–8.1)
Total Bilirubin: 0.6 mg/dL (ref 0.3–1.2)

## 2017-03-06 LAB — URINALYSIS, COMPLETE (UACMP) WITH MICROSCOPIC
BILIRUBIN URINE: NEGATIVE
Bacteria, UA: NONE SEEN
GLUCOSE, UA: NEGATIVE mg/dL
HGB URINE DIPSTICK: NEGATIVE
Ketones, ur: NEGATIVE mg/dL
Leukocytes, UA: NEGATIVE
NITRITE: NEGATIVE
PH: 6 (ref 5.0–8.0)
Protein, ur: NEGATIVE mg/dL
SPECIFIC GRAVITY, URINE: 1.01 (ref 1.005–1.030)

## 2017-03-06 LAB — LIPASE, BLOOD: LIPASE: 22 U/L (ref 11–51)

## 2017-03-06 MED ORDER — SODIUM CHLORIDE 0.9 % IV SOLN
INTRAVENOUS | Status: DC
  Administered 2017-03-07 – 2017-03-10 (×6): via INTRAVENOUS

## 2017-03-06 MED ORDER — ACETAMINOPHEN 650 MG RE SUPP: 650.0000 mg | Freq: Four times a day (QID) | RECTAL | Status: DC | PRN

## 2017-03-06 MED ORDER — MORPHINE SULFATE (PF) 2 MG/ML IV SOLN: 2.0000 mg | INTRAVENOUS | Status: DC | PRN

## 2017-03-06 MED ORDER — ONDANSETRON HCL 4 MG/2ML IJ SOLN
4.0000 mg | Freq: Four times a day (QID) | INTRAMUSCULAR | Status: DC | PRN
  Administered 2017-03-06 – 2017-03-11 (×8): 4 mg via INTRAVENOUS
  Filled 2017-03-06 (×8): qty 2

## 2017-03-06 MED ORDER — ONDANSETRON HCL 4 MG PO TABS: 4.0000 mg | ORAL_TABLET | Freq: Four times a day (QID) | ORAL | Status: DC | PRN

## 2017-03-06 MED ORDER — SODIUM CHLORIDE 0.9 % IV SOLN
Freq: Once | INTRAVENOUS | Status: AC
  Administered 2017-03-06: 16:00:00 via INTRAVENOUS

## 2017-03-06 MED ORDER — ALBUTEROL SULFATE (2.5 MG/3ML) 0.083% IN NEBU: 2.5000 mg | INHALATION_SOLUTION | RESPIRATORY_TRACT | Status: DC | PRN

## 2017-03-06 MED ORDER — HYDROCODONE-ACETAMINOPHEN 5-325 MG PO TABS: 1.0000 | ORAL_TABLET | ORAL | Status: DC | PRN

## 2017-03-06 MED ORDER — ACETAMINOPHEN 325 MG PO TABS: 650.0000 mg | ORAL_TABLET | Freq: Four times a day (QID) | ORAL | Status: DC | PRN

## 2017-03-06 MED ORDER — IOPAMIDOL (ISOVUE-300) INJECTION 61%
15.0000 mL | INTRAVENOUS | Status: AC
  Administered 2017-03-06: 30 mL via ORAL

## 2017-03-06 MED ORDER — HEPARIN SODIUM (PORCINE) 5000 UNIT/ML IJ SOLN
5000.0000 [IU] | Freq: Three times a day (TID) | INTRAMUSCULAR | Status: DC
  Administered 2017-03-07 – 2017-03-12 (×15): 5000 [IU] via SUBCUTANEOUS
  Filled 2017-03-06 (×15): qty 1

## 2017-03-06 MED ORDER — AMLODIPINE BESYLATE 5 MG PO TABS
5.0000 mg | ORAL_TABLET | Freq: Every day | ORAL | Status: DC
  Administered 2017-03-07 – 2017-03-11 (×3): 5 mg via ORAL
  Filled 2017-03-06 (×4): qty 1

## 2017-03-06 NOTE — ED Notes (Signed)
Patient's linens and gown changed.

## 2017-03-06 NOTE — Progress Notes (Signed)
Advanced Care Plan.  Purpose of Encounter: CODE STATUS. Parties in Attendance: The patient, her daughter and mother. Patient's Decisional Capacity: Yes Medical Story: Kimberly Abbott  is a 83 y.o. female with a known history of Hypertension, hyperlipidemia, CKD and GERD. She is admitted for partial small bowel obstruction. I discussed about CODE STATUS with the patient and her daughter. The patient lives independently and is in good health. The patient wants full code for now. Plan:  Code Status: Full code for now.  Time spent discussing advance care planning: 17 minutes.

## 2017-03-06 NOTE — Consult Note (Signed)
Patient ID: Kimberly Abbott, female   DOB: 1921-06-09, 83 y.o.   MRN: [id number]  HPI Kimberly Abbott is a 83 y.o. female asked to see in consultation by Dr. Archie Balboa for abdominal pain. She reports that the pain started last night is diffuse intermittent and colicky type. She had multiple episode of emesis. No fevers or chills. He had a normal bowel movement yesterday. She does have a history of chronic kidney disease but does not 1 dialysis. Previous surgeries include appendectomy and ectopic pregnancy. CT scan personal reviewed there is evidence of dilated loops of small bowel. There is no evidence of pneumatosis and no evidence of free air or abscess. Creatinine is 2.1 and white count is normal with a hemoglobin of 11.8  HPI  Past Medical History:  Diagnosis Date  . Allergy   . CKD (chronic kidney disease)   . GERD (gastroesophageal reflux disease)   . Hyperlipidemia   . Hypertension     Past Surgical History:  Procedure Laterality Date  . APPENDECTOMY    . rectal fistula  in the 40's  . TUBAL LIGATION      History reviewed. No pertinent family history.  Social History Social History  Substance Use Topics  . Smoking status: Former Research scientist (life sciences)  . Smokeless tobacco: Never Used  . Alcohol use No    Allergies  Allergen Reactions  . Penicillin G Hives  . Penicillins Hives    Has patient had a PCN reaction causing immediate rash, facial/tongue/throat swelling, SOB or lightheadedness with hypotension: No Has patient had a PCN reaction causing severe rash involving mucus membranes or skin necrosis: No Has patient had a PCN reaction that required hospitalization No Has patient had a PCN reaction occurring within the last 10 years: No If all of the above answers are "NO", then may proceed with Cephalosporin use.    No current facility-administered medications for this encounter.    Current Outpatient Prescriptions  Medication Sig Dispense Refill  . allopurinol (ZYLOPRIM) 100 MG tablet  Take 100 mg by mouth daily.    Marland Kitchen amLODipine (NORVASC) 5 MG tablet Take 1 tablet (5 mg total) by mouth daily. 90 tablet 3  . Cholecalciferol (VITAMIN D3) 2000 units capsule Take 1 capsule (2,000 Units total) by mouth daily.    . Cranberry ([employer] CRANBERRY) 500 MG CAPS Take 1 capsule by mouth daily.     Marland Kitchen oxybutynin (DITROPAN) 5 MG tablet Take 5 mg by mouth 2 (two) times daily.     Marland Kitchen sulfamethoxazole-trimethoprim (BACTRIM DS,SEPTRA DS) 800-160 MG tablet Take 1 tablet by mouth 2 (two) times daily. 10 tablet 0  . feeding supplement, ENSURE ENLIVE, (ENSURE ENLIVE) LIQD Take 237 mLs by mouth 2 (two) times daily between meals. 237 mL 12     Review of Systems Full ROS  was asked and was negative except for the information on the HPI  Physical Exam Blood pressure (!) 162/72, pulse 76, temperature 99 F (37.2 C), temperature source Oral, resp. rate 16, height 4\' 11"  (1.499 m), weight 51.3 kg (113 lb), SpO2 98 %. CONSTITUTIONAL: Elderly female in NAD EYES: Pupils are equal, round, and reactive to light, Sclera are non-icteric. EARS, NOSE, MOUTH AND THROAT: The oropharynx is clear. The oral mucosa is pink and moist. Hearing is intact to voice. LYMPH NODES:  Lymph nodes in the neck are normal. RESPIRATORY:  Lungs are clear. There is normal respiratory effort, with equal breath sounds bilaterally, and without pathologic use of accessory muscles. CARDIOVASCULAR: Heart is regular without  murmurs, gallops, or rubs. GI: The abdomen is  soft, nontender, mildly distended. No peritonitis. There are no palpable masses. There is no hepatosplenomegaly.  Decrease BS GU: Rectal deferred.   MUSCULOSKELETAL: Normal muscle strength and tone. No cyanosis or edema.   SKIN: Turgor is good and there are no pathologic skin lesions or ulcers. NEUROLOGIC: Motor and sensation is grossly normal. Cranial nerves are grossly intact. PSYCH:  Oriented to person, place and time. Affect is normal.  Data Reviewed  I have personally  reviewed the patient's imaging, laboratory findings and medical records.    Assessment/Plan 83 year old presented with abdominal pain nausea and vomiting consistent with ileus versus partial small bowel obstruction. No evidence of acute abdomen at this time. Recommend NG tube decompression nothing by mouth and serial abdominal exams. Patient is adamant that she does not wish any major abdominal operations nor dialysis at this time. Recommend admission to the medical team for medical management. Discussed in detail with Dr. Archie Balboa and with family. We will continue to follow Caroleen Hamman, MD [place] [facility] 03/06/2017, 6:47 PM

## 2017-03-06 NOTE — ED Notes (Signed)
NG tube placement held at this time per Dr. Archie Balboa.

## 2017-03-06 NOTE — ED Notes (Addendum)
Patient had emesis bile-colored fluid. Dr. Bridgett Larsson aware.

## 2017-03-06 NOTE — ED Provider Notes (Signed)
[facility] Emergency Department Provider Note   ____________________________________________   I have reviewed the triage vital signs and the nursing notes.   HISTORY  Chief Complaint Abdominal Pain and Emesis   History limited by: Not Limited   HPI Kimberly Abbott is a 83 y.o. female who presents to the emergency department today because of concerns for abdominal pain and vomiting. The symptoms started last night. The patient states that it did interrupt her sleep. She says that the pain moves around her stomach. This morning she started vomiting. She has had roughly 5 episodes of vomiting. Consist primarily of water as well as some darker liquid. She denies any bowel movements today. Denies passing any gas today. She denies any fevers.   Past Medical History:  Diagnosis Date  . Allergy   . CKD (chronic kidney disease)   . GERD (gastroesophageal reflux disease)   . Hyperlipidemia   . Hypertension     Patient Active Problem List   Diagnosis Date Noted  . History of esophageal stricture 01/29/2017  . Hiatal hernia 01/29/2017  . Dementia 01/29/2017  . Cricopharyngeal achalasia 01/29/2017  . Vitamin D deficiency 01/29/2017  . Gait instability 01/28/2017  . Gout 01/28/2017  . Unintentional weight loss 01/28/2017  . Abnormal EKG 06/08/2016  . Primary osteoarthritis of both hands 05/03/2016  . Chronic fatigue 09/01/2015  . Rotator cuff impingement syndrome of left shoulder 09/01/2015  . Pustular psoriasis of palms and soles 05/05/2015  . Appetite impaired 02/10/2015  . CKD (chronic kidney disease) stage 3, GFR 30-59 ml/min 02/10/2015  . Allergic rhinitis 12/31/2014  . Dermatitis, eczematoid 12/31/2014  . Hyperlipidemia 11/08/2014  . Shortness of breath 11/08/2014  . Anemia 11/08/2014  . Recurrent UTI 11/08/2014  . Depression 11/08/2014  . Gastroesophageal reflux disease without esophagitis 11/08/2014  . Mixed urge and stress incontinence  11/08/2014  . At moderate risk for fall 11/08/2014  . Dysphagia 11/08/2014  . Rotator cuff syndrome of right shoulder 11/08/2014  . Essential hypertension 11/08/2014    Past Surgical History:  Procedure Laterality Date  . APPENDECTOMY    . rectal fistula  in the 40's  . TUBAL LIGATION      Prior to Admission medications   Medication Sig Start Date End Date Taking? Authorizing Provider  allopurinol (ZYLOPRIM) 100 MG tablet Take 100 mg by mouth daily. 05/03/16 05/03/17  [provider]  amLODipine (NORVASC) 5 MG tablet Take 1 tablet (5 mg total) by mouth daily. 01/28/17   Plonk, Gwyndolyn Saxon, MD  Cholecalciferol (VITAMIN D3) 2000 units capsule Take 1 capsule (2,000 Units total) by mouth daily. 03/01/17   Plonk, Gwyndolyn Saxon, MD  Cranberry ([employer] CRANBERRY) 500 MG CAPS Take 1 capsule by mouth daily.     [provider]  feeding supplement, ENSURE ENLIVE, (ENSURE ENLIVE) LIQD Take 237 mLs by mouth 2 (two) times daily between meals. 06/10/16   Fritzi Mandes, MD  oxybutynin (DITROPAN) 5 MG tablet Take 5 mg by mouth 2 (two) times daily.     [provider]  sulfamethoxazole-trimethoprim (BACTRIM DS,SEPTRA DS) 800-160 MG tablet Take 1 tablet by mouth 2 (two) times daily. 03/01/17   Plonk, Gwyndolyn Saxon, MD    Allergies Penicillin g and Penicillins  History reviewed. No pertinent family history.  Social History Social History  Substance Use Topics  . Smoking status: Former Research scientist (life sciences)  . Smokeless tobacco: Never Used  . Alcohol use No    Review of Systems Constitutional: No fever/chills Eyes: No visual changes. ENT: No sore  throat. Cardiovascular: Denies chest pain. Respiratory: Denies shortness of breath. Gastrointestinal: Positive for abdominal pain and vomiting. Genitourinary: Negative for dysuria. Musculoskeletal: Negative for back pain. Skin: Negative for rash. Neurological: Negative for headaches, focal weakness or  numbness.  ____________________________________________   PHYSICAL EXAM:  VITAL SIGNS: ED Triage Vitals [03/06/17 1046]  Enc Vitals Group     BP 127/74     Pulse Rate 93     Resp 16     Temp 99 F (37.2 C)     Temp Source Oral     SpO2 96 %     Weight 113 lb (51.3 kg)     Height 4\' 11"  (1.499 m)   Constitutional: Alert and oriented. Well appearing and in no distress. Eyes: Conjunctivae are normal.  ENT   Head: Normocephalic and atraumatic.   Nose: No congestion/rhinnorhea.   Mouth/Throat: Mucous membranes are moist.   Neck: No stridor. Hematological/Lymphatic/Immunilogical: No cervical lymphadenopathy. Cardiovascular: Normal rate, regular rhythm.  No murmurs, rubs, or gallops. Respiratory: Normal respiratory effort without tachypnea nor retractions. Breath sounds are clear and equal bilaterally. No wheezes/rales/rhonchi. Gastrointestinal: Soft and slightly tender to palpation diffusely. No rebound. No guarding.  Genitourinary: Deferred Musculoskeletal: Normal range of motion in all extremities. No lower extremity edema. Neurologic:  Normal speech and language. No gross focal neurologic deficits are appreciated.  Skin:  Skin is warm, dry and intact. No rash noted. Psychiatric: Mood and affect are normal. Speech and behavior are normal. Patient exhibits appropriate insight and judgment.  ____________________________________________    LABS (pertinent positives/negatives)  Labs Reviewed  CBC WITH DIFFERENTIAL/PLATELET - Abnormal; Notable for the following:       Result Value   Hemoglobin 11.8 (*)    RDW 16.2 (*)    Neutro Abs 9.9 (*)    Lymphs Abs 0.5 (*)    All other components within normal limits  COMPREHENSIVE METABOLIC PANEL - Abnormal; Notable for the following:    Potassium 5.2 (*)    CO2 21 (*)    Glucose, Bld 112 (*)    BUN 35 (*)    Creatinine, Ser 2.15 (*)    Alkaline Phosphatase 131 (*)    GFR calc non Af Amer 19 (*)    GFR calc Af Amer  21 (*)    All other components within normal limits  URINALYSIS, COMPLETE (UACMP) WITH MICROSCOPIC - Abnormal; Notable for the following:    Color, Urine YELLOW (*)    APPearance CLEAR (*)    Squamous Epithelial / LPF 0-5 (*)    All other components within normal limits  BASIC METABOLIC PANEL - Abnormal; Notable for the following:    Sodium 134 (*)    Potassium 5.2 (*)    BUN 29 (*)    Creatinine, Ser 1.71 (*)    GFR calc non Af Amer 24 (*)    GFR calc Af Amer 28 (*)    All other components within normal limits  LIPASE, BLOOD     ____________________________________________   EKG  None  ____________________________________________    RADIOLOGY  CT abd/pel IMPRESSION:  Sigmoid diverticulosis without inflammation.    Moderate size sliding-type hiatal hernia.    Aortic atherosclerosis.    Proximal small bowel dilatation is noted with possible transition  zone seen in the pelvis. This is concerning for partial small bowel  obstruction.    ____________________________________________   PROCEDURES  Procedures  ____________________________________________   INITIAL IMPRESSION / ASSESSMENT AND PLAN / ED COURSE  Pertinent labs &  imaging results that were available during my care of the patient were reviewed by me and considered in my medical decision making (see chart for details).  Patient presented to the emergency department today because of concerns for abdominal pain and vomiting. CT scan was concerning for partial small bowel obstruction. Will have surgery evaluate.  ____________________________________________   FINAL CLINICAL IMPRESSION(S) / ED DIAGNOSES  Final diagnoses:  Partial small bowel obstruction ([place])     Note: This dictation was prepared with Dragon dictation. Any transcriptional errors that result from this process are unintentional     Nance Pear, MD 03/07/17 [phone]

## 2017-03-06 NOTE — H&P (Signed)
[facility] [place] NAME: Kimberly Abbott    MR#:  [id number]  DATE OF BIRTH:  1921-04-02  DATE OF ADMISSION:  03/06/2017  PRIMARY CARE PHYSICIAN: Adline Potter, MD   REQUESTING/REFERRING PHYSICIAN: Dr Joni Fears.  CHIEF COMPLAINT:   Chief Complaint  Patient presents with  . Abdominal Pain  . Emesis   Abdominal pain, nausea and vomiting since last night. HISTORY OF PRESENT ILLNESS:  Kimberly Abbott  is a 83 y.o. female with a known history of Hypertension, hyperlipidemia, CKD and GERD. The patient started to have abdominal pain, nausea and vomiting several times since last night. The abdominal pain is intermittent, diffuse and crampy without radiation. She said that she had bowel movement yesterday without diarrhea, bloody so or melena. She denies any fever or chills. The CAT scan of the abdomen show partial bowel obstruction. In addition, she was found elevated creatinine up to 2.1.  PAST MEDICAL HISTORY:   Past Medical History:  Diagnosis Date  . Allergy   . CKD (chronic kidney disease)   . GERD (gastroesophageal reflux disease)   . Hyperlipidemia   . Hypertension     PAST SURGICAL HISTORY:   Past Surgical History:  Procedure Laterality Date  . APPENDECTOMY    . rectal fistula  in the 40's  . TUBAL LIGATION      SOCIAL HISTORY:   Social History  Substance Use Topics  . Smoking status: Former Research scientist (life sciences)  . Smokeless tobacco: Never Used  . Alcohol use No    FAMILY HISTORY:  History reviewed. No pertinent family history.  DRUG ALLERGIES:   Allergies  Allergen Reactions  . Penicillin G Hives  . Penicillins Hives    Has patient had a PCN reaction causing immediate rash, facial/tongue/throat swelling, SOB or lightheadedness with hypotension: No Has patient had a PCN reaction causing severe rash involving mucus membranes or skin necrosis: No Has patient had a PCN reaction that required hospitalization No Has patient had a  PCN reaction occurring within the last 10 years: No If all of the above answers are "NO", then may proceed with Cephalosporin use.    REVIEW OF SYSTEMS:   Review of Systems  Constitutional: Positive for malaise/fatigue. Negative for chills and fever.  HENT: Negative for sore throat.   Eyes: Negative for blurred vision and double vision.  Respiratory: Negative for cough, hemoptysis, shortness of breath, wheezing and stridor.   Cardiovascular: Negative for chest pain, palpitations, orthopnea and leg swelling.  Gastrointestinal: Positive for abdominal pain, nausea and vomiting. Negative for blood in stool, constipation, diarrhea and melena.  Genitourinary: Negative for dysuria, flank pain and hematuria.  Musculoskeletal: Negative for back pain and joint pain.  Neurological: Positive for dizziness and weakness. Negative for sensory change, focal weakness, seizures, loss of consciousness and headaches.  Endo/Heme/Allergies: Negative for polydipsia.  Psychiatric/Behavioral: Negative for depression. The patient is not nervous/anxious.     MEDICATIONS AT HOME:   Prior to Admission medications   Medication Sig Start Date End Date Taking? Authorizing Provider  allopurinol (ZYLOPRIM) 100 MG tablet Take 100 mg by mouth daily. 05/03/16 05/03/17 Yes [provider]  amLODipine (NORVASC) 5 MG tablet Take 1 tablet (5 mg total) by mouth daily. 01/28/17  Yes Plonk, Gwyndolyn Saxon, MD  Cholecalciferol (VITAMIN D3) 2000 units capsule Take 1 capsule (2,000 Units total) by mouth daily. 03/01/17  Yes Plonk, Gwyndolyn Saxon, MD  Cranberry ([employer] CRANBERRY) 500 MG CAPS Take 1 capsule by mouth daily.  Yes [provider]  oxybutynin (DITROPAN) 5 MG tablet Take 5 mg by mouth 2 (two) times daily.    Yes [provider]  sulfamethoxazole-trimethoprim (BACTRIM DS,SEPTRA DS) 800-160 MG tablet Take 1 tablet by mouth 2 (two) times daily. 03/01/17  Yes Plonk, Gwyndolyn Saxon, MD  feeding supplement, ENSURE ENLIVE, (ENSURE  ENLIVE) LIQD Take 237 mLs by mouth 2 (two) times daily between meals. 06/10/16   Fritzi Mandes, MD      VITAL SIGNS:  Blood pressure (!) 162/72, pulse 76, temperature 99 F (37.2 C), temperature source Oral, resp. rate 16, height 4\' 11"  (1.499 m), weight 113 lb (51.3 kg), SpO2 98 %.  PHYSICAL EXAMINATION:  Physical Exam  GENERAL:  83 y.o.-year-old patient lying in the bed with no acute distress.  EYES: Pupils equal, round, reactive to light and accommodation. No scleral icterus. Extraocular muscles intact.  HEENT: Head atraumatic, normocephalic. Oropharynx and nasopharynx clear.  NECK:  Supple, no jugular venous distention. No thyroid enlargement, no tenderness.  LUNGS: Normal breath sounds bilaterally, no wheezing, rales,rhonchi or crepitation. No use of accessory muscles of respiration.  CARDIOVASCULAR: S1, S2 normal. No murmurs, rubs, or gallops.  ABDOMEN: Soft, nontender, nondistended. Bowel sounds present. No organomegaly or mass.  EXTREMITIES: No pedal edema, cyanosis, or clubbing.  NEUROLOGIC: Cranial nerves II through XII are intact. Muscle strength 4/5 in all extremities. Sensation intact. Gait not checked.  PSYCHIATRIC: The patient is alert and oriented x 3.  SKIN: No obvious rash, lesion, or ulcer.   LABORATORY PANEL:   CBC  Recent Labs Lab 03/06/17 1103  WBC 10.9  HGB 11.8*  HCT 35.0  PLT 268   ------------------------------------------------------------------------------------------------------------------  Chemistries   Recent Labs Lab 03/06/17 1103  NA 135  K 5.2*  CL 103  CO2 21*  GLUCOSE 112*  BUN 35*  CREATININE 2.15*  CALCIUM 10.0  AST 24  ALT 16  ALKPHOS 131*  BILITOT 0.6   ------------------------------------------------------------------------------------------------------------------  Cardiac Enzymes No results for input(s): TROPONINI in the last 168  hours. ------------------------------------------------------------------------------------------------------------------  RADIOLOGY:  Ct Abdomen Pelvis Wo Contrast  Result Date: 03/06/2017 CLINICAL DATA:  Acute generalized abdominal pain. EXAM: CT ABDOMEN AND PELVIS WITHOUT CONTRAST TECHNIQUE: Multidetector CT imaging of the abdomen and pelvis was performed following the standard protocol without IV contrast. COMPARISON:  CT scan of July 16, 2014. FINDINGS: Lower chest: No acute abnormality. Hepatobiliary: No focal liver abnormality is seen. No gallstones, gallbladder wall thickening, or biliary dilatation. Pancreas: Unremarkable. No pancreatic ductal dilatation or surrounding inflammatory changes. Spleen: Calcified splenic granulomata are noted. Adrenals/Urinary Tract: Adrenal glands are unremarkable. Stable left renal cysts are noted. No hydronephrosis or renal obstruction is noted. Mild urinary bladder distention is noted. Stomach/Bowel: Sigmoid diverticulosis is noted without inflammation. Moderate size sliding-type hiatal hernia is noted. Status post appendectomy. Proximal small bowel dilatation is noted with possible transition zone seen in the pelvis. This is concerning for small bowel obstruction. Stool is seen throughout the colon. Vascular/Lymphatic: Aortic atherosclerosis. No enlarged abdominal or pelvic lymph nodes. Reproductive: Uterus and bilateral adnexa are unremarkable. Other: No abdominal wall hernia or abnormality. No abdominopelvic ascites. Musculoskeletal: No acute or significant osseous findings. IMPRESSION: Sigmoid diverticulosis without inflammation. Moderate size sliding-type hiatal hernia. Aortic atherosclerosis. Proximal small bowel dilatation is noted with possible transition zone seen in the pelvis. This is concerning for partial small bowel obstruction. Electronically Signed   By: Marijo Conception, M.D.   On: 03/06/2017 13:35      IMPRESSION AND PLAN:   Partial  small  bowel obstruction. The patient will be admitted to medical floor. Keep nothing by mouth, IV fluid support, Zofran and pain control when necessary.  Acute renal failure on CKD stage III ontinue normal saline IV, follow-up BMP.  Hyperkalemia. Due to acute renal failure. Normal saline IV and follow-up potassium level.  Hypertension. Continue Norvasc. All the records are reviewed and case discussed with ED provider. Management plans discussed with the patient, her daughter and they are in agreement.  CODE STATUS: full code  TOTAL TIME TAKING CARE OF THIS PATIENT: 53 minutes.    Demetrios Loll M.D on 03/06/2017 at 7:03 PM  Between 7am to 6pm - Pager - [phone]  After 6pm go to [email or web address] - Proofreader  [facility]  Office  [phone]  CC: Primary care physician; Adline Potter, MD   Note: This dictation was prepared with Dragon dictation along with smaller phrase technology. Any transcriptional errors that result from this process are unintentional.

## 2017-03-06 NOTE — ED Notes (Signed)
Patient taken to CT scan, given warm blanket. Patient unable to void at this time.

## 2017-03-06 NOTE — ED Triage Notes (Signed)
States this AM she woke up and began vomiting, states 5 episodes, states some general abd pain that comes and goes, pt awake and alert in no acute distress

## 2017-03-06 NOTE — ED Notes (Signed)
Please refer to paper chart for information up to this minute.

## 2017-03-07 ENCOUNTER — Inpatient Hospital Stay: Payer: Medicare HMO

## 2017-03-07 DIAGNOSIS — K567 Ileus, unspecified: Secondary | ICD-10-CM

## 2017-03-07 LAB — BASIC METABOLIC PANEL
Anion gap: 8 (ref 5–15)
BUN: 29 mg/dL — AB (ref 6–20)
CALCIUM: 9.3 mg/dL (ref 8.9–10.3)
CHLORIDE: 103 mmol/L (ref 101–111)
CO2: 23 mmol/L (ref 22–32)
CREATININE: 1.71 mg/dL — AB (ref 0.44–1.00)
GFR calc Af Amer: 28 mL/min — ABNORMAL LOW (ref >60)
GFR calc non Af Amer: 24 mL/min — ABNORMAL LOW (ref >60)
GLUCOSE: 97 mg/dL (ref 65–99)
Potassium: 5.2 mmol/L — ABNORMAL HIGH (ref 3.5–5.1)
Sodium: 134 mmol/L — ABNORMAL LOW (ref 135–145)

## 2017-03-07 LAB — POTASSIUM
POTASSIUM: 4.9 mmol/L (ref 3.5–5.1)
Potassium: 5.4 mmol/L — ABNORMAL HIGH (ref 3.5–5.1)

## 2017-03-07 MED ORDER — PROMETHAZINE HCL 25 MG/ML IJ SOLN
12.5000 mg | INTRAMUSCULAR | Status: DC | PRN
  Administered 2017-03-07 – 2017-03-11 (×7): 12.5 mg via INTRAVENOUS
  Filled 2017-03-07 (×7): qty 1

## 2017-03-07 MED ORDER — SODIUM POLYSTYRENE SULFONATE 15 GM/60ML PO SUSP
45.0000 g | Freq: Two times a day (BID) | ORAL | Status: AC
  Administered 2017-03-07 (×2): 45 g via RECTAL
  Filled 2017-03-07 (×2): qty 180

## 2017-03-07 MED ORDER — SODIUM POLYSTYRENE SULFONATE 15 GM/60ML PO SUSP
45.0000 g | Freq: Once | ORAL | Status: AC
  Administered 2017-03-07: 45 g via RECTAL
  Filled 2017-03-07: qty 180

## 2017-03-07 MED ORDER — FLEET ENEMA 7-19 GM/118ML RE ENEM
1.0000 | ENEMA | Freq: Once | RECTAL | Status: AC
  Administered 2017-03-07: 1 via RECTAL

## 2017-03-07 NOTE — Progress Notes (Signed)
CC: ileus Subjective: Pain improving, No emesis No flatus Creat improving KUB reviewed mild dilation SB, stool No free air  Objective: Vital signs in last 24 hours: Temp:  [98 F (36.7 C)-98.5 F (36.9 C)] 98.5 F (36.9 C) (08/09 0458) Pulse Rate:  [74-82] 74 (08/09 1051) Resp:  [16-21] 18 (08/09 0458) BP: (115-165)/(52-95) 165/52 (08/09 1051) SpO2:  [97 %-100 %] 97 % (08/09 0458) Last BM Date: 03/05/17  Intake/Output from previous day: 08/08 0701 - 08/09 0700 In: 600 [I.V.:600] Out: 400 [Urine:400] Intake/Output this shift: Total I/O In: -  Out: 500 [Urine:500]  Physical exam: Debilitated elderly female Abd: soft, mild distension, no peritonitis Ext: no edema   Lab Results: CBC   Recent Labs  03/06/17 1103  WBC 10.9  HGB 11.8*  HCT 35.0  PLT 268   BMET  Recent Labs  03/06/17 1103 03/07/17 0444  NA 135 134*  K 5.2* 5.2*  CL 103 103  CO2 21* 23  GLUCOSE 112* 97  BUN 35* 29*  CREATININE 2.15* 1.71*  CALCIUM 10.0 9.3   PT/INR No results for input(s): LABPROT, INR in the last 72 hours. ABG No results for input(s): PHART, HCO3 in the last 72 hours.  Invalid input(s): PCO2, PO2  Studies/Results: Ct Abdomen Pelvis Wo Contrast  Result Date: 03/06/2017 CLINICAL DATA:  Acute generalized abdominal pain. EXAM: CT ABDOMEN AND PELVIS WITHOUT CONTRAST TECHNIQUE: Multidetector CT imaging of the abdomen and pelvis was performed following the standard protocol without IV contrast. COMPARISON:  CT scan of July 16, 2014. FINDINGS: Lower chest: No acute abnormality. Hepatobiliary: No focal liver abnormality is seen. No gallstones, gallbladder wall thickening, or biliary dilatation. Pancreas: Unremarkable. No pancreatic ductal dilatation or surrounding inflammatory changes. Spleen: Calcified splenic granulomata are noted. Adrenals/Urinary Tract: Adrenal glands are unremarkable. Stable left renal cysts are noted. No hydronephrosis or renal obstruction is noted. Mild  urinary bladder distention is noted. Stomach/Bowel: Sigmoid diverticulosis is noted without inflammation. Moderate size sliding-type hiatal hernia is noted. Status post appendectomy. Proximal small bowel dilatation is noted with possible transition zone seen in the pelvis. This is concerning for small bowel obstruction. Stool is seen throughout the colon. Vascular/Lymphatic: Aortic atherosclerosis. No enlarged abdominal or pelvic lymph nodes. Reproductive: Uterus and bilateral adnexa are unremarkable. Other: No abdominal wall hernia or abnormality. No abdominopelvic ascites. Musculoskeletal: No acute or significant osseous findings. IMPRESSION: Sigmoid diverticulosis without inflammation. Moderate size sliding-type hiatal hernia. Aortic atherosclerosis. Proximal small bowel dilatation is noted with possible transition zone seen in the pelvis. This is concerning for partial small bowel obstruction. Electronically Signed   By: Marijo Conception, M.D.   On: 03/06/2017 13:35   Dg Abd Portable 2v  Result Date: 03/07/2017 CLINICAL DATA:  Abdominal pain EXAM: PORTABLE ABDOMEN - 2 VIEW COMPARISON:  CT abdomen and pelvis March 06, 2017 FINDINGS: There is diffuse stool throughout much of the colon. There are loops of focal dilated small bowel in the lower left abdomen. No air-fluid levels are noted. No free air. Lung bases are clear. There is degenerative change in the lumbar spine with thoracolumbar levoscoliosis. There is aortoiliac atherosclerosis. IMPRESSION: Loops of dilated small bowel in the left lower abdomen. Suspect early ileus or enteritis. Early bowel obstruction is possible but felt to be less likely. There is diffuse stool throughout much of the colon. No free air evident. There is aortoiliac atherosclerosis. Aortic Atherosclerosis (ICD10-I70.0). Electronically Signed   By: Lowella Grip III M.D.   On: 03/07/2017 08:54  Anti-infectives: Anti-infectives    None      Assessment/Plan: Ileus w  constipation, slowly improving No further emesis We will hold off NGT No surgical intervention Enema today Caroleen Hamman, MD, FACS  03/07/2017

## 2017-03-07 NOTE — Progress Notes (Signed)
Earlier during the day D/C NG tube order after notifying  Dr. Ether Griffins. Three different nurses attempt and were unsuccessful. Pt started to vomit again call Dr. Ether Griffins and order NG tube again. Unable to put a NG tube due to resistance in the throat. Notified Dr. Dahlia Byes and he sugggested to treat nausea with medications and keep pt NPO.

## 2017-03-07 NOTE — Progress Notes (Signed)
[facility] [place] NAME: Kimberly Abbott    MR#:  [id number]  DATE OF BIRTH:  Dec 17, 1920  SUBJECTIVE:  CHIEF COMPLAINT:   Chief Complaint  Patient presents with  . Abdominal Pain  . Emesis  The patient is 83 year old female with past medical history significant for history of hypertension, hyperlipidemia, CAD, gastroesophageal reflux disease, multiple abdominal surgeries, including appendectomy, rectal fistula surgery, tubal ligation, who presents to the hospital with complaints of nausea, vomiting, abdominal pain. CAT scan in the emergency room revealed partial small bowel obstruction. Patient continues to have abdominal distention, nausea, but no vomiting, she is passing some gas per rectum, but no stool. She was seen by surgeon, who recommended NG tube placement and suctioning, she is agreeable to NG tube placement, now. Her kidney function is improving on IV fluid administration. Patient admits of infrequent urination, however, large volume of urine.  Review of Systems  Constitutional: Negative for chills, fever and weight loss.  HENT: Negative for congestion.   Eyes: Negative for blurred vision and double vision.  Respiratory: Negative for cough, sputum production, shortness of breath and wheezing.   Cardiovascular: Negative for chest pain, palpitations, orthopnea, leg swelling and PND.  Gastrointestinal: Positive for abdominal pain and nausea. Negative for blood in stool, constipation, diarrhea and vomiting.  Genitourinary: Negative for dysuria, frequency, hematuria and urgency.  Musculoskeletal: Negative for falls.  Neurological: Negative for dizziness, tremors, focal weakness and headaches.  Endo/Heme/Allergies: Does not bruise/bleed easily.  Psychiatric/Behavioral: Negative for depression. The patient does not have insomnia.     VITAL SIGNS: Blood pressure (!) 165/52, pulse 74, temperature 98.5 F (36.9 C), temperature  source Oral, resp. rate 18, height 4\' 11"  (1.499 m), weight 51.3 kg (113 lb), SpO2 97 %.  PHYSICAL EXAMINATION:   GENERAL:  84 y.o.-year-old patient lying in the bed with no acute distress.  EYES: Pupils equal, round, reactive to light and accommodation. No scleral icterus. Extraocular muscles intact.  HEENT: Head atraumatic, normocephalic. Oropharynx and nasopharynx clear.  NECK:  Supple, no jugular venous distention. No thyroid enlargement, no tenderness.  LUNGS: Normal breath sounds bilaterally, no wheezing, rales,rhonchi or crepitation. No use of accessory muscles of respiration.  CARDIOVASCULAR: S1, S2 normal. No murmurs, rubs, or gallops.  ABDOMEN: Soft, tender diffusely, but no rebound or guarding, mild distention. Bowel sounds present. No organomegaly or mass.  EXTREMITIES: No pedal edema, cyanosis, or clubbing.  NEUROLOGIC: Cranial nerves II through XII are intact. Muscle strength 5/5 in all extremities. Sensation intact. Gait not checked.  PSYCHIATRIC: The patient is alert and oriented x 3.  SKIN: No obvious rash, lesion, or ulcer.   ORDERS/RESULTS REVIEWED:   CBC  Recent Labs Lab 03/06/17 1103  WBC 10.9  HGB 11.8*  HCT 35.0  PLT 268  MCV 87.2  MCH 29.5  MCHC 33.8  RDW 16.2*  LYMPHSABS 0.5*  MONOABS 0.3  EOSABS 0.0  BASOSABS 0.1   ------------------------------------------------------------------------------------------------------------------  Chemistries   Recent Labs Lab 03/06/17 1103 03/07/17 0444  NA 135 134*  K 5.2* 5.2*  CL 103 103  CO2 21* 23  GLUCOSE 112* 97  BUN 35* 29*  CREATININE 2.15* 1.71*  CALCIUM 10.0 9.3  AST 24  --   ALT 16  --   ALKPHOS 131*  --   BILITOT 0.6  --    ------------------------------------------------------------------------------------------------------------------ estimated creatinine clearance is 13.7 mL/min (A) (by C-G formula based on SCr of 1.71 mg/dL  (H)). ------------------------------------------------------------------------------------------------------------------ No results  for input(s): TSH, T4TOTAL, T3FREE, THYROIDAB in the last 72 hours.  Invalid input(s): FREET3  Cardiac Enzymes No results for input(s): CKMB, TROPONINI, MYOGLOBIN in the last 168 hours.  Invalid input(s): CK ------------------------------------------------------------------------------------------------------------------ Invalid input(s): POCBNP ---------------------------------------------------------------------------------------------------------------  RADIOLOGY: Ct Abdomen Pelvis Wo Contrast  Result Date: 03/06/2017 CLINICAL DATA:  Acute generalized abdominal pain. EXAM: CT ABDOMEN AND PELVIS WITHOUT CONTRAST TECHNIQUE: Multidetector CT imaging of the abdomen and pelvis was performed following the standard protocol without IV contrast. COMPARISON:  CT scan of July 16, 2014. FINDINGS: Lower chest: No acute abnormality. Hepatobiliary: No focal liver abnormality is seen. No gallstones, gallbladder wall thickening, or biliary dilatation. Pancreas: Unremarkable. No pancreatic ductal dilatation or surrounding inflammatory changes. Spleen: Calcified splenic granulomata are noted. Adrenals/Urinary Tract: Adrenal glands are unremarkable. Stable left renal cysts are noted. No hydronephrosis or renal obstruction is noted. Mild urinary bladder distention is noted. Stomach/Bowel: Sigmoid diverticulosis is noted without inflammation. Moderate size sliding-type hiatal hernia is noted. Status post appendectomy. Proximal small bowel dilatation is noted with possible transition zone seen in the pelvis. This is concerning for small bowel obstruction. Stool is seen throughout the colon. Vascular/Lymphatic: Aortic atherosclerosis. No enlarged abdominal or pelvic lymph nodes. Reproductive: Uterus and bilateral adnexa are unremarkable. Other: No abdominal wall hernia or abnormality. No  abdominopelvic ascites. Musculoskeletal: No acute or significant osseous findings. IMPRESSION: Sigmoid diverticulosis without inflammation. Moderate size sliding-type hiatal hernia. Aortic atherosclerosis. Proximal small bowel dilatation is noted with possible transition zone seen in the pelvis. This is concerning for partial small bowel obstruction. Electronically Signed   By: Marijo Conception, M.D.   On: 03/06/2017 13:35   Dg Abd Portable 2v  Result Date: 03/07/2017 CLINICAL DATA:  Abdominal pain EXAM: PORTABLE ABDOMEN - 2 VIEW COMPARISON:  CT abdomen and pelvis March 06, 2017 FINDINGS: There is diffuse stool throughout much of the colon. There are loops of focal dilated small bowel in the lower left abdomen. No air-fluid levels are noted. No free air. Lung bases are clear. There is degenerative change in the lumbar spine with thoracolumbar levoscoliosis. There is aortoiliac atherosclerosis. IMPRESSION: Loops of dilated small bowel in the left lower abdomen. Suspect early ileus or enteritis. Early bowel obstruction is possible but felt to be less likely. There is diffuse stool throughout much of the colon. No free air evident. There is aortoiliac atherosclerosis. Aortic Atherosclerosis (ICD10-I70.0). Electronically Signed   By: Lowella Grip III M.D.   On: 03/07/2017 08:54    EKG:  Orders placed or performed during the hospital encounter of 09/05/16  . ED EKG  . ED EKG    ASSESSMENT AND PLAN:  Active Problems:   Partial small bowel obstruction ([place])  #1. Partial small bowel obstruction  versus ileus, placing NG tube to low intermittent suctioning, appreciate surgical input, continue IV fluids, nothing by mouth for now, follow KUB in the morning #2. Acute renal injury, known CAD stage III, improved on IV fluid administration, getting bladder scan to rule out urinary retention, may need Foley catheter #3. Hyperkalemia, Kayexalate rectally, follow potassium level in the morning #4.  Hyponatremia, continue IV fluids, follow sodium level in the morning #5. Anemia, check hemoglobin level in the morning, likely rehydration related, no active bleeding noted.  Management plans discussed with the patient, family and they are in agreement.   DRUG ALLERGIES:  Allergies  Allergen Reactions  . Penicillin G Hives  . Penicillins Hives    Has patient had a PCN reaction causing immediate rash, facial/tongue/throat swelling,  SOB or lightheadedness with hypotension: No Has patient had a PCN reaction causing severe rash involving mucus membranes or skin necrosis: No Has patient had a PCN reaction that required hospitalization No Has patient had a PCN reaction occurring within the last 10 years: No If all of the above answers are "NO", then may proceed with Cephalosporin use.    CODE STATUS:     Code Status Orders        Start     Ordered   03/06/17 2034  Full code  Continuous     03/06/17 2033    Code Status History    Date Active Date Inactive Code Status Order ID Comments User Context   06/08/2016 11:37 AM 06/10/2016  3:53 PM Full Code [id number]  Fritzi Mandes, MD Inpatient    Advance Directive Documentation     Most Recent Value  Type of Advance Directive  Healthcare Power of [place], Living will  Pre-existing out of facility DNR order (yellow form or pink MOST form)  -  "MOST" Form in Place?  -      TOTAL TIME TAKING CARE OF THIS PATIENT: 40 minutes.    Theodoro Grist M.D on 03/07/2017 at 11:08 AM  Between 7am to 6pm - Pager - [phone]  After 6pm go to [email or web address] - password [email or web address] [facility]  Office  [phone]  CC: Primary care physician; Adline Potter, MD

## 2017-03-07 NOTE — Progress Notes (Signed)
Notified Dr. Ether Griffins K level increased from 5.2 to 5.4. Dr. Ether Griffins order Kayexlete stat.

## 2017-03-07 NOTE — Progress Notes (Signed)
Multiple RNs attempted to place an NG tube today and even Dr. Dahlia Byes came up to try and place the NG tube but was unsuccessful.  So Dr. Dahlia Byes said it was ok to leave it out.

## 2017-03-07 NOTE — Plan of Care (Signed)
Problem: Nutrition: Goal: Adequate nutrition will be maintained Outcome: Not Progressing Pt NPO   

## 2017-03-08 ENCOUNTER — Ambulatory Visit: Payer: Medicare HMO | Admitting: Podiatry

## 2017-03-08 ENCOUNTER — Inpatient Hospital Stay: Payer: Medicare HMO

## 2017-03-08 LAB — BASIC METABOLIC PANEL
ANION GAP: 10 (ref 5–15)
BUN: 26 mg/dL — ABNORMAL HIGH (ref 6–20)
CHLORIDE: 108 mmol/L (ref 101–111)
CO2: 25 mmol/L (ref 22–32)
CREATININE: 1.65 mg/dL — AB (ref 0.44–1.00)
Calcium: 9.5 mg/dL (ref 8.9–10.3)
GFR calc non Af Amer: 25 mL/min — ABNORMAL LOW (ref >60)
GFR, EST AFRICAN AMERICAN: 30 mL/min — AB (ref >60)
Glucose, Bld: 104 mg/dL — ABNORMAL HIGH (ref 65–99)
POTASSIUM: 4.4 mmol/L (ref 3.5–5.1)
SODIUM: 143 mmol/L (ref 135–145)

## 2017-03-08 MED ORDER — FLEET ENEMA 7-19 GM/118ML RE ENEM
1.0000 | ENEMA | Freq: Once | RECTAL | Status: AC
  Administered 2017-03-08: 1 via RECTAL

## 2017-03-08 MED ORDER — FLEET ENEMA 7-19 GM/118ML RE ENEM: 1.0000 | ENEMA | Freq: Every day | RECTAL | Status: DC | PRN

## 2017-03-08 NOTE — Plan of Care (Signed)
Problem: Fluid Volume: Goal: Ability to maintain a balanced intake and output will improve Outcome: Not Progressing Patient is NPO due to nausea/vomiting.  Problem: Nutrition: Goal: Adequate nutrition will be maintained Outcome: Not Progressing Patient is NPO.   Problem: Bowel/Gastric: Goal: Will not experience complications related to bowel motility Outcome: Progressing Patient given enemas with little effect.

## 2017-03-08 NOTE — Care Management (Signed)
Patient admitted for SBO.  Patient lives at home alone.  Resides at [facility].  Daughter lives locally for support.  PCP Plonk.  Pharmacy [facility].  PT has assessed patient and recommended home health PT. Patient in agreement to services. Agency preference list left with daughter.

## 2017-03-08 NOTE — Plan of Care (Signed)
Problem: Nutrition: Goal: Adequate nutrition will be maintained Outcome: Progressing Pt able to tolerate ice chips P.O., but still not allowed to eat or drink

## 2017-03-08 NOTE — Evaluation (Signed)
Physical Therapy Evaluation Patient Details Name: Kimberly Abbott MRN: [id number] DOB: August 24, 1920 Today's Date: 03/08/2017   History of Present Illness  Pt is 83 year old female with past medical history significant for history of hypertension, hyperlipidemia, CAD, gastroesophageal reflux disease, multiple abdominal surgeries, including appendectomy, rectal fistula surgery, tubal ligation, who presents to the hospital with complaints of nausea, vomiting, abdominal pain. CAT scan in the emergency room revealed partial small bowel obstruction. Patient continues to have abdominal distention, nausea, but no vomiting, she is passing some gas per rectum, but no stool.  Assessment includes: Partial small bowel obstruction versus ileus, Acute renal injury, known CAD stage III, hyperkalemia, hyponatremia, and anemia.    Clinical Impression  Pt presents with mild deficits in strength, transfers, gait, and balance, and moderate deficits in activity tolerance.  Pt Ind with all bed mobility tasks without extra time or effort required.  Pt steady with sit to/from stand transfers with good effort and control during both concentric and eccentric phases of transfers.  Pt steady in standing with good stability with RW and fair stability without AD.  Pt able to amb 80' with RW and SBA with slow cadence and short B step length but steady without LOB.  Pt c/o feeling "whoozy" after activity with SpO2 and HR WNL and BP 141/94 mmHg, nursing notified.  Pt will benefit from HHPT services upon discharge to address above deficits for a safe return to PLOF.      Follow Up Recommendations Home health PT    Equipment Recommendations  None recommended by PT    Recommendations for Other Services       Precautions / Restrictions Precautions Precautions: Fall Restrictions Weight Bearing Restrictions: No      Mobility  Bed Mobility Overal bed mobility: Independent                Transfers Overall transfer level:  Needs assistance Equipment used: Rolling walker (2 wheeled) Transfers: Sit to/from Stand Sit to Stand: Supervision         General transfer comment: Good stability upon initial stand  Ambulation/Gait Ambulation/Gait assistance: Supervision Ambulation Distance (Feet): 80 Feet Assistive device: Rolling walker (2 wheeled) Gait Pattern/deviations: WFL(Within Functional Limits)   Gait velocity interpretation: Below normal speed for age/gender General Gait Details: Pt generally steady with amb with slow cadence but decreased activity tolerance compared to baseline  Stairs            Wheelchair Mobility    Modified Rankin (Stroke Patients Only)       Balance Overall balance assessment: Needs assistance Sitting-balance support: No upper extremity supported;Feet supported;Feet unsupported Sitting balance-Leahy Scale: Normal     Standing balance support: Bilateral upper extremity supported;No upper extremity supported Standing balance-Leahy Scale: Good                               Pertinent Vitals/Pain Pain Assessment: No/denies pain    Home Living Family/patient expects to be discharged to:: Private residence Living Arrangements: Alone Available Help at Discharge: Family;Available PRN/intermittently (Has an emergency pull cord in apt at senior living facility and lunches provided M-F ) Type of Home: Apartment (Senior apartments) Home Access: Level entry;Elevator     Home Layout: One level Home Equipment: Environmental consultant - 4 wheels;Cane - single point      Prior Function Level of Independence: Independent with assistive device(s)         Comments: Mod I with ambulation with SPC limited  community distances, no fall history, Ind with ADLs     Hand Dominance        Extremity/Trunk Assessment        Lower Extremity Assessment Lower Extremity Assessment: Generalized weakness       Communication   Communication: No difficulties  Cognition  Arousal/Alertness: Awake/alert Behavior During Therapy: WFL for tasks assessed/performed Overall Cognitive Status: Within Functional Limits for tasks assessed                                        General Comments      Exercises Total Joint Exercises Ankle Circles/Pumps: AROM;Both;10 reps;5 reps Quad Sets: Strengthening;Both;10 reps Gluteal Sets: Strengthening;Both;10 reps Heel Slides: AROM;Both;5 reps Hip ABduction/ADduction: AROM;Both;5 reps Long Arc Quad: AROM;Both;10 reps Marching in Standing: AROM;Both;5 reps;10 reps Other Exercises Other Exercises: HEP edcuation/review with pt for BLE APs, QS, and GS x 10 each 5-6x/day   Assessment/Plan    PT Assessment Patient needs continued PT services  PT Problem List Decreased activity tolerance;Decreased balance       PT Treatment Interventions Gait training;Neuromuscular re-education;Balance training;Therapeutic exercise;Therapeutic activities;Patient/family education    PT Goals (Current goals can be found in the Care Plan section)  Acute Rehab PT Goals Patient Stated Goal: Improved endurance PT Goal Formulation: With patient Time For Goal Achievement: 03/21/17 Potential to Achieve Goals: Good    Frequency Min 2X/week   Barriers to discharge        Co-evaluation               AM-PAC PT "6 Clicks" Daily Activity  Outcome Measure Difficulty turning over in bed (including adjusting bedclothes, sheets and blankets)?: None Difficulty moving from lying on back to sitting on the side of the bed? : None Difficulty sitting down on and standing up from a chair with arms (e.g., wheelchair, bedside commode, etc,.)?: None Help needed moving to and from a bed to chair (including a wheelchair)?: A Little Help needed walking in hospital room?: A Little Help needed climbing 3-5 steps with a railing? : A Little 6 Click Score: 21    End of Session Equipment Utilized During Treatment: Gait belt Activity  Tolerance: Patient limited by fatigue Patient left: in bed;with bed alarm set;with call bell/phone within reach;with family/visitor present Nurse Communication: Mobility status;Other (comment) (BP of 141/94 mmHg after activity) PT Visit Diagnosis: Muscle weakness (generalized) (M62.81)    Time: 6803-2122 PT Time Calculation (min) (ACUTE ONLY): 40 min   Charges:   PT Evaluation $PT Eval Low Complexity: 1 Low PT Treatments $Therapeutic Exercise: 8-22 mins   PT G Codes:        DRoyetta Asal PT, DPT 03/08/17, 2:29 PM

## 2017-03-08 NOTE — Care Management Important Message (Signed)
Important Message  Patient Details  Name: Kimberly Abbott MRN: [id number] Date of Birth: January 30, 1921   Medicare Important Message Given:  Yes    Beverly Sessions, RN 03/08/2017, 10:53 AM

## 2017-03-08 NOTE — Progress Notes (Signed)
CC: ileus Subjective: Feling better this am and she is hungry. Did have emesis last yesterday. Unable to get NGT. No flatus KUB p. Reviewed. Air in the colon but air fluid levels and dilated loops of SB, no free air  Objective: Vital signs in last 24 hours: Temp:  [97.5 F (36.4 C)-98.3 F (36.8 C)] 98.1 F (36.7 C) (08/10 1215) Pulse Rate:  [83-87] 84 (08/10 1215) Resp:  [16] 16 (08/10 0417) BP: (151-157)/(59-83) 157/70 (08/10 1215) SpO2:  [96 %-98 %] 98 % (08/10 1215) Last BM Date: 03/07/17  Intake/Output from previous day: 08/09 0701 - 08/10 0700 In: 1024 [I.V.:1024] Out: 1600 [Urine:900; Emesis/NG output:700] Intake/Output this shift: No intake/output data recorded.  Physical exam: Debilitated female in NAD Abd: soft, mildly distended, non tender, no peritonitis. Decrease bs Ext: well perfused and no edema Lab Results: CBC   Recent Labs  03/06/17 1103  WBC 10.9  HGB 11.8*  HCT 35.0  PLT 268   BMET  Recent Labs  03/07/17 0444  03/07/17 1921 03/08/17 0418  NA 134*  --   --  143  K 5.2*  < > 4.9 4.4  CL 103  --   --  108  CO2 23  --   --  25  GLUCOSE 97  --   --  104*  BUN 29*  --   --  26*  CREATININE 1.71*  --   --  1.65*  CALCIUM 9.3  --   --  9.5  < > = values in this interval not displayed. PT/INR No results for input(s): LABPROT, INR in the last 72 hours. ABG No results for input(s): PHART, HCO3 in the last 72 hours.  Invalid input(s): PCO2, PO2  Studies/Results: Ct Abdomen Pelvis Wo Contrast  Result Date: 03/06/2017 CLINICAL DATA:  Acute generalized abdominal pain. EXAM: CT ABDOMEN AND PELVIS WITHOUT CONTRAST TECHNIQUE: Multidetector CT imaging of the abdomen and pelvis was performed following the standard protocol without IV contrast. COMPARISON:  CT scan of July 16, 2014. FINDINGS: Lower chest: No acute abnormality. Hepatobiliary: No focal liver abnormality is seen. No gallstones, gallbladder wall thickening, or biliary dilatation.  Pancreas: Unremarkable. No pancreatic ductal dilatation or surrounding inflammatory changes. Spleen: Calcified splenic granulomata are noted. Adrenals/Urinary Tract: Adrenal glands are unremarkable. Stable left renal cysts are noted. No hydronephrosis or renal obstruction is noted. Mild urinary bladder distention is noted. Stomach/Bowel: Sigmoid diverticulosis is noted without inflammation. Moderate size sliding-type hiatal hernia is noted. Status post appendectomy. Proximal small bowel dilatation is noted with possible transition zone seen in the pelvis. This is concerning for small bowel obstruction. Stool is seen throughout the colon. Vascular/Lymphatic: Aortic atherosclerosis. No enlarged abdominal or pelvic lymph nodes. Reproductive: Uterus and bilateral adnexa are unremarkable. Other: No abdominal wall hernia or abnormality. No abdominopelvic ascites. Musculoskeletal: No acute or significant osseous findings. IMPRESSION: Sigmoid diverticulosis without inflammation. Moderate size sliding-type hiatal hernia. Aortic atherosclerosis. Proximal small bowel dilatation is noted with possible transition zone seen in the pelvis. This is concerning for partial small bowel obstruction. Electronically Signed   By: Marijo Conception, M.D.   On: 03/06/2017 13:35   US Renal  Result Date: 03/07/2017 CLINICAL DATA:  Acute renal failure EXAM: RENAL / URINARY TRACT ULTRASOUND COMPLETE COMPARISON:  CT from yesterday FINDINGS: Right Kidney: Length: 8 cm. Probable upper pole cyst measuring 6 mm. No hydronephrosis or solid mass. Left Kidney: Length: 8 cm. Three cysts, measuring up to 22 mm in the lower pole. The cysts have  a simple appearance. No hydronephrosis. Bladder: Appears normal for degree of bladder distention. Incidental visualization of dilated bowel, which was seen by CT yesterday. IMPRESSION: 1. No hydronephrosis. 2. Simple renal cysts. 3. Symmetric renal atrophy. Electronically Signed   By: Monte Fantasia M.D.   On:  03/07/2017 14:58   Dg Abd Portable 2v  Result Date: 03/08/2017 CLINICAL DATA:  Ileus EXAM: PORTABLE ABDOMEN - 2 VIEW COMPARISON:  03/07/2017 abdominal radiographs FINDINGS: Mildly dilated small bowel loops throughout the central abdomen measuring up to 3.4 cm diameter with scattered air-fluid levels, not appreciably changed. Moderate stool throughout the large bowel. No evidence of pneumatosis or pneumoperitoneum. Clear lung bases. Aortic atherosclerosis. No radiopaque urolithiasis. Moderate long segment levocurvature of the thoracolumbar spine with associated marked degenerative changes. IMPRESSION: Stable mildly dilated small bowel loops with fluid levels throughout the central abdomen, which could represent adynamic ileus or partial distal small bowel obstruction . Moderate colonic stool may indicate constipation. Electronically Signed   By: Ilona Sorrel M.D.   On: 03/08/2017 09:20   Dg Abd Portable 2v  Result Date: 03/07/2017 CLINICAL DATA:  Abdominal pain EXAM: PORTABLE ABDOMEN - 2 VIEW COMPARISON:  CT abdomen and pelvis March 06, 2017 FINDINGS: There is diffuse stool throughout much of the colon. There are loops of focal dilated small bowel in the lower left abdomen. No air-fluid levels are noted. No free air. Lung bases are clear. There is degenerative change in the lumbar spine with thoracolumbar levoscoliosis. There is aortoiliac atherosclerosis. IMPRESSION: Loops of dilated small bowel in the left lower abdomen. Suspect early ileus or enteritis. Early bowel obstruction is possible but felt to be less likely. There is diffuse stool throughout much of the colon. No free air evident. There is aortoiliac atherosclerosis. Aortic Atherosclerosis (ICD10-I70.0). Electronically Signed   By: Lowella Grip III M.D.   On: 03/07/2017 08:54    Anti-infectives: Anti-infectives    None      Assessment/Plan: Ileus continue NPO No surgical indication at this time Enema today  Caroleen Hamman, MD,  [facility]  03/08/2017

## 2017-03-08 NOTE — Progress Notes (Signed)
[place] [facility] [place] NAME: Kimberly Abbott    MR#:  [id number]  DATE OF BIRTH:  12/08/1920  SUBJECTIVE:  CHIEF COMPLAINT:   Chief Complaint  Patient presents with  . Abdominal Pain  . Emesis  The patient is 83 year old female with past medical history significant for history of hypertension, hyperlipidemia, CAD, gastroesophageal reflux disease, multiple abdominal surgeries, including appendectomy, rectal fistula surgery, tubal ligation, who presents to the hospital with complaints of nausea, vomiting, abdominal pain. CAT scan in the emergency room revealed partial small bowel obstruction. Patient continues to have abdominal distention, nausea, but no vomiting, she is passing some gas per rectum, but no stool. She was seen by surgeon, who recommended NG tube placement and suctioning,   Patient could not tolerate NG tube. AfterFleet enema patient had a  Very small bowel movement, reports some abdominal discomfort. Daughter at bedside  Review of Systems  Constitutional: Negative for chills, fever and weight loss.  HENT: Negative for congestion.   Eyes: Negative for blurred vision and double vision.  Respiratory: Negative for cough, sputum production, shortness of breath and wheezing.   Cardiovascular: Negative for chest pain, palpitations, orthopnea, leg swelling and PND.  Gastrointestinal: Positive for abdominal pain and nausea. Negative for blood in stool, constipation, diarrhea and vomiting.  Genitourinary: Negative for dysuria, frequency, hematuria and urgency.  Musculoskeletal: Negative for falls.  Neurological: Negative for dizziness, tremors, focal weakness and headaches.  Endo/Heme/Allergies: Does not bruise/bleed easily.  Psychiatric/Behavioral: Negative for depression. The patient does not have insomnia.     VITAL SIGNS: Blood pressure (!) 157/70, pulse 84, temperature 98.1 F (36.7 C), temperature source Oral, resp. rate 16,  height 4\' 11"  (1.499 m), weight 51.3 kg (113 lb), SpO2 98 %.  PHYSICAL EXAMINATION:   GENERAL:  83 y.o.-year-old patient lying in the bed with no acute distress.  EYES: Pupils equal, round, reactive to light and accommodation. No scleral icterus. Extraocular muscles intact.  HEENT: Head atraumatic, normocephalic. Oropharynx and nasopharynx clear.  NECK:  Supple, no jugular venous distention. No thyroid enlargement, no tenderness.  LUNGS: Normal breath sounds bilaterally, no wheezing, rales,rhonchi or crepitation. No use of accessory muscles of respiration.  CARDIOVASCULAR: S1, S2 normal. No murmurs, rubs, or gallops.  ABDOMEN: Soft, tender diffusely, but no rebound or guarding, mild distention. Bowel sounds present. No organomegaly or mass.  EXTREMITIES: No pedal edema, cyanosis, or clubbing.  NEUROLOGIC: Cranial nerves II through XII are intact. Muscle strength 5/5 in all extremities. Sensation intact. Gait not checked.  PSYCHIATRIC: The patient is alert and oriented x 3.  SKIN: No obvious rash, lesion, or ulcer.   ORDERS/RESULTS REVIEWED:   CBC  Recent Labs Lab 03/06/17 1103  WBC 10.9  HGB 11.8*  HCT 35.0  PLT 268  MCV 87.2  MCH 29.5  MCHC 33.8  RDW 16.2*  LYMPHSABS 0.5*  MONOABS 0.3  EOSABS 0.0  BASOSABS 0.1   ------------------------------------------------------------------------------------------------------------------  Chemistries   Recent Labs Lab 03/06/17 1103 03/07/17 0444 03/07/17 1516 03/07/17 1921 03/08/17 0418  NA 135 134*  --   --  143  K 5.2* 5.2* 5.4* 4.9 4.4  CL 103 103  --   --  108  CO2 21* 23  --   --  25  GLUCOSE 112* 97  --   --  104*  BUN 35* 29*  --   --  26*  CREATININE 2.15* 1.71*  --   --  1.65*  CALCIUM 10.0  9.3  --   --  9.5  AST 24  --   --   --   --   ALT 16  --   --   --   --   ALKPHOS 131*  --   --   --   --   BILITOT 0.6  --   --   --   --     ------------------------------------------------------------------------------------------------------------------ estimated creatinine clearance is 14.2 mL/min (A) (by C-G formula based on SCr of 1.65 mg/dL (H)). ------------------------------------------------------------------------------------------------------------------ No results for input(s): TSH, T4TOTAL, T3FREE, THYROIDAB in the last 72 hours.  Invalid input(s): FREET3  Cardiac Enzymes No results for input(s): CKMB, TROPONINI, MYOGLOBIN in the last 168 hours.  Invalid input(s): CK ------------------------------------------------------------------------------------------------------------------ Invalid input(s): POCBNP ---------------------------------------------------------------------------------------------------------------  RADIOLOGY: Ct Abdomen Pelvis Wo Contrast  Result Date: 03/06/2017 CLINICAL DATA:  Acute generalized abdominal pain. EXAM: CT ABDOMEN AND PELVIS WITHOUT CONTRAST TECHNIQUE: Multidetector CT imaging of the abdomen and pelvis was performed following the standard protocol without IV contrast. COMPARISON:  CT scan of July 16, 2014. FINDINGS: Lower chest: No acute abnormality. Hepatobiliary: No focal liver abnormality is seen. No gallstones, gallbladder wall thickening, or biliary dilatation. Pancreas: Unremarkable. No pancreatic ductal dilatation or surrounding inflammatory changes. Spleen: Calcified splenic granulomata are noted. Adrenals/Urinary Tract: Adrenal glands are unremarkable. Stable left renal cysts are noted. No hydronephrosis or renal obstruction is noted. Mild urinary bladder distention is noted. Stomach/Bowel: Sigmoid diverticulosis is noted without inflammation. Moderate size sliding-type hiatal hernia is noted. Status post appendectomy. Proximal small bowel dilatation is noted with possible transition zone seen in the pelvis. This is concerning for small bowel obstruction. Stool is seen  throughout the colon. Vascular/Lymphatic: Aortic atherosclerosis. No enlarged abdominal or pelvic lymph nodes. Reproductive: Uterus and bilateral adnexa are unremarkable. Other: No abdominal wall hernia or abnormality. No abdominopelvic ascites. Musculoskeletal: No acute or significant osseous findings. IMPRESSION: Sigmoid diverticulosis without inflammation. Moderate size sliding-type hiatal hernia. Aortic atherosclerosis. Proximal small bowel dilatation is noted with possible transition zone seen in the pelvis. This is concerning for partial small bowel obstruction. Electronically Signed   By: Marijo Conception, M.D.   On: 03/06/2017 13:35   US Renal  Result Date: 03/07/2017 CLINICAL DATA:  Acute renal failure EXAM: RENAL / URINARY TRACT ULTRASOUND COMPLETE COMPARISON:  CT from yesterday FINDINGS: Right Kidney: Length: 8 cm. Probable upper pole cyst measuring 6 mm. No hydronephrosis or solid mass. Left Kidney: Length: 8 cm. Three cysts, measuring up to 22 mm in the lower pole. The cysts have a simple appearance. No hydronephrosis. Bladder: Appears normal for degree of bladder distention. Incidental visualization of dilated bowel, which was seen by CT yesterday. IMPRESSION: 1. No hydronephrosis. 2. Simple renal cysts. 3. Symmetric renal atrophy. Electronically Signed   By: Monte Fantasia M.D.   On: 03/07/2017 14:58   Dg Abd Portable 2v  Result Date: 03/08/2017 CLINICAL DATA:  Ileus EXAM: PORTABLE ABDOMEN - 2 VIEW COMPARISON:  03/07/2017 abdominal radiographs FINDINGS: Mildly dilated small bowel loops throughout the central abdomen measuring up to 3.4 cm diameter with scattered air-fluid levels, not appreciably changed. Moderate stool throughout the large bowel. No evidence of pneumatosis or pneumoperitoneum. Clear lung bases. Aortic atherosclerosis. No radiopaque urolithiasis. Moderate long segment levocurvature of the thoracolumbar spine with associated marked degenerative changes. IMPRESSION: Stable mildly  dilated small bowel loops with fluid levels throughout the central abdomen, which could represent adynamic ileus or partial distal small bowel obstruction . Moderate colonic stool may indicate constipation. Electronically  Signed   By: Ilona Sorrel M.D.   On: 03/08/2017 09:20   Dg Abd Portable 2v  Result Date: 03/07/2017 CLINICAL DATA:  Abdominal pain EXAM: PORTABLE ABDOMEN - 2 VIEW COMPARISON:  CT abdomen and pelvis March 06, 2017 FINDINGS: There is diffuse stool throughout much of the colon. There are loops of focal dilated small bowel in the lower left abdomen. No air-fluid levels are noted. No free air. Lung bases are clear. There is degenerative change in the lumbar spine with thoracolumbar levoscoliosis. There is aortoiliac atherosclerosis. IMPRESSION: Loops of dilated small bowel in the left lower abdomen. Suspect early ileus or enteritis. Early bowel obstruction is possible but felt to be less likely. There is diffuse stool throughout much of the colon. No free air evident. There is aortoiliac atherosclerosis. Aortic Atherosclerosis (ICD10-I70.0). Electronically Signed   By: Lowella Grip III M.D.   On: 03/07/2017 08:54    EKG:  Orders placed or performed during the hospital encounter of 09/05/16  . ED EKG  . ED EKG    ASSESSMENT AND PLAN:  Active Problems:   Partial small bowel obstruction (HCC)   Ileus (HCC)  #1. Partial small bowel obstruction  versus ileus,  Patient could not tolerate NG tube placement Continue enema PRN Will try ice chips as tolerated  Serial KUBs and follow with surgery   #2. Acute renal injury, known CAD stage III, improved on IV fluid administration Clinically improving Monitor renal function and avoid nephrotoxins Creatinine 1.7-1.65  #3. Hyperkalemia Kayexalate was given yesterday potassium at 4.4, resolved  #4. Hyponatremia,Improved with IV fluids sodium at 143  #5. Anemia, check hemoglobin level in the morning, likely rehydration related, no  active bleeding noted.  Management plans discussed with the patient, family and they are in agreement.   DRUG ALLERGIES:  Allergies  Allergen Reactions  . Penicillin G Hives  . Penicillins Hives    Has patient had a PCN reaction causing immediate rash, facial/tongue/throat swelling, SOB or lightheadedness with hypotension: No Has patient had a PCN reaction causing severe rash involving mucus membranes or skin necrosis: No Has patient had a PCN reaction that required hospitalization No Has patient had a PCN reaction occurring within the last 10 years: No If all of the above answers are "NO", then may proceed with Cephalosporin use.    CODE STATUS:     Code Status Orders        Start     Ordered   03/06/17 2034  Full code  Continuous     03/06/17 2033    Code Status History    Date Active Date Inactive Code Status Order ID Comments User Context   06/08/2016 11:37 AM 06/10/2016  3:53 PM Full Code [id number]  Fritzi Mandes, MD Inpatient    Advance Directive Documentation     Most Recent Value  Type of Advance Directive  Healthcare Power of [place], Living will  Pre-existing out of facility DNR order (yellow form or pink MOST form)  -  "MOST" Form in Place?  -      TOTAL TIME TAKING CARE OF THIS PATIENT: 35 minutes.    Nicholes Mango M.D on 03/08/2017 at 12:46 PM  Between 7am to 6pm - Pager - [phone]  After 6pm go to [email or web address] - password [email or web address]  Office  [phone]  CC: Primary care physician; Adline Potter, MD

## 2017-03-08 NOTE — Plan of Care (Signed)
Problem: Bowel/Gastric: Goal: Will not experience complications related to bowel motility Outcome: Progressing Pt had very small BM today after administration of Fleet enema.  Not enough to provide significant relief.  MD will likely repeat enema again tomorrow.

## 2017-03-09 ENCOUNTER — Inpatient Hospital Stay: Payer: Medicare HMO

## 2017-03-09 LAB — CBC
HCT: 38.1 % (ref 35.0–47.0)
HEMOGLOBIN: 12.4 g/dL (ref 12.0–16.0)
MCH: 28.8 pg (ref 26.0–34.0)
MCHC: 32.5 g/dL (ref 32.0–36.0)
MCV: 88.6 fL (ref 80.0–100.0)
PLATELETS: 264 10*3/uL (ref 150–440)
RBC: 4.3 MIL/uL (ref 3.80–5.20)
RDW: 16.3 % — ABNORMAL HIGH (ref 11.5–14.5)
WBC: 10 10*3/uL (ref 3.6–11.0)

## 2017-03-09 LAB — GLUCOSE, CAPILLARY
GLUCOSE-CAPILLARY: 152 mg/dL — AB (ref 65–99)
Glucose-Capillary: 145 mg/dL — ABNORMAL HIGH (ref 65–99)

## 2017-03-09 LAB — COMPREHENSIVE METABOLIC PANEL
ALBUMIN: 3.8 g/dL (ref 3.5–5.0)
ALK PHOS: 112 U/L (ref 38–126)
ALT: 16 U/L (ref 14–54)
ANION GAP: 10 (ref 5–15)
AST: 27 U/L (ref 15–41)
BUN: 25 mg/dL — ABNORMAL HIGH (ref 6–20)
CHLORIDE: 111 mmol/L (ref 101–111)
CO2: 21 mmol/L — AB (ref 22–32)
CREATININE: 1.33 mg/dL — AB (ref 0.44–1.00)
Calcium: 9.4 mg/dL (ref 8.9–10.3)
GFR calc non Af Amer: 33 mL/min — ABNORMAL LOW (ref >60)
GFR, EST AFRICAN AMERICAN: 38 mL/min — AB (ref >60)
GLUCOSE: 78 mg/dL (ref 65–99)
Potassium: 4.1 mmol/L (ref 3.5–5.1)
SODIUM: 142 mmol/L (ref 135–145)
Total Bilirubin: 0.8 mg/dL (ref 0.3–1.2)
Total Protein: 7.4 g/dL (ref 6.5–8.1)

## 2017-03-09 LAB — MAGNESIUM: Magnesium: 2 mg/dL (ref 1.7–2.4)

## 2017-03-09 LAB — PHOSPHORUS: Phosphorus: 3.4 mg/dL (ref 2.5–4.6)

## 2017-03-09 LAB — TRIGLYCERIDES: Triglycerides: 101 mg/dL (ref <150)

## 2017-03-09 MED ORDER — LACTULOSE ENEMA
300.0000 mL | Freq: Once | ORAL | Status: AC
  Administered 2017-03-09: 300 mL via RECTAL
  Filled 2017-03-09: qty 300

## 2017-03-09 MED ORDER — TRACE MINERALS CR-CU-MN-SE-ZN 10-1000-500-60 MCG/ML IV SOLN
INTRAVENOUS | Status: AC
  Administered 2017-03-09: 18:00:00 via INTRAVENOUS
  Filled 2017-03-09: qty 960

## 2017-03-09 MED ORDER — INSULIN ASPART 100 UNIT/ML ~~LOC~~ SOLN
0.0000 [IU] | SUBCUTANEOUS | Status: DC
  Administered 2017-03-09: 2 [IU] via SUBCUTANEOUS
  Administered 2017-03-09 – 2017-03-10 (×2): 1 [IU] via SUBCUTANEOUS
  Administered 2017-03-10 – 2017-03-11 (×6): 2 [IU] via SUBCUTANEOUS
  Administered 2017-03-11: 1 [IU] via SUBCUTANEOUS
  Administered 2017-03-11 (×2): 2 [IU] via SUBCUTANEOUS
  Administered 2017-03-11 – 2017-03-14 (×16): 1 [IU] via SUBCUTANEOUS
  Administered 2017-03-14: 2 [IU] via SUBCUTANEOUS
  Administered 2017-03-14 – 2017-03-17 (×14): 1 [IU] via SUBCUTANEOUS
  Filled 2017-03-09 (×43): qty 1

## 2017-03-09 MED ORDER — METOPROLOL TARTRATE 5 MG/5ML IV SOLN
10.0000 mg | INTRAVENOUS | Status: DC | PRN
  Administered 2017-03-09: 10 mg via INTRAVENOUS
  Filled 2017-03-09: qty 10

## 2017-03-09 MED ORDER — METOPROLOL TARTRATE 5 MG/5ML IV SOLN
5.0000 mg | INTRAVENOUS | Status: DC | PRN
  Administered 2017-03-10: 5 mg via INTRAVENOUS
  Filled 2017-03-09: qty 5

## 2017-03-09 MED ORDER — NITROGLYCERIN 2 % TD OINT
0.5000 [in_us] | TOPICAL_OINTMENT | Freq: Four times a day (QID) | TRANSDERMAL | Status: DC | PRN
  Administered 2017-03-09: 0.5 [in_us] via TOPICAL
  Filled 2017-03-09: qty 1

## 2017-03-09 MED ORDER — METOPROLOL TARTRATE 5 MG/5ML IV SOLN
5.0000 mg | INTRAVENOUS | Status: DC | PRN
  Administered 2017-03-09: 5 mg via INTRAVENOUS
  Filled 2017-03-09: qty 5

## 2017-03-09 MED ORDER — DIATRIZOATE MEGLUMINE & SODIUM 66-10 % PO SOLN: 90.0000 mL | Freq: Once | ORAL | Status: DC

## 2017-03-09 NOTE — Progress Notes (Signed)
Brief Nutrition Note  Consult received for parenteral nutrition. Central Access: not yet achieved. Only peripheral line. PICC pending.   Pharmacy to initiate Clinimix 5%AA/15%Dextrose at 17ml/hr with MVI and Trace Minerals at this time.  Full assessment and further recommendations to follow.  Admitting Dx: Partial small bowel obstruction ([place]) [K56.600]  Body mass index is 22.82 kg/m. Pt meets criteria for healthy wt for ht based on current BMI.  Labs:   Recent Labs Lab 03/07/17 0444  03/07/17 1921 03/08/17 0418 03/09/17 0354  NA 134*  --   --  143 142  K 5.2*  < > 4.9 4.4 4.1  CL 103  --   --  108 111  CO2 23  --   --  25 21*  BUN 29*  --   --  26* 25*  CREATININE 1.71*  --   --  1.65* 1.33*  CALCIUM 9.3  --   --  9.5 9.4  < > = values in this interval not displayed.  Burtis Junes RD, LDN, CNSC Clinical Nutrition Pager: [phone] 03/09/2017 3:07 PM

## 2017-03-09 NOTE — Progress Notes (Signed)
PHARMACY - ADULT TOTAL PARENTERAL NUTRITION CONSULT NOTE   Pharmacy Consult for TPN ordering and monitoring  Indication: Prolonged Ileus   Patient Measurements:  Height: 4\' 11"  (149.9 cm) Weight: 113 lb (51.3 kg) IBW/kg (Calculated) : 43.2 TPN AdjBW (KG): 51.3 Body mass index is 22.82 kg/m.   Assessment: 83 yo female with prolonged Ileus (SBO vs Ileus)   GI: LBM: 8/10 Endo: BGs: stable  Insulin requirements in the past 24 hours:  Lytes:  K: 4.1  Mg: 2.0   Phos:  3.4 Renal:Scr: 1.33   CrCl: 17.6  Best Practices: TPN Access:8/11 TPN start date:8/11  Nutritional Goals (per RD recommendation on ): Will F/U with dietitan plan on 8/12   kCal: Protein:   Current Nutrition: NS 35ml/hr  Plan:  Electrolytes WNL- no additional replacement at this time.   Start Clinimix 5/15 at 55ml/hr NS  IVMF (D5, NS, etc.) at 75 ml/hr  Add MVI and trace elements in TPN  Every 4 hours SSI ordered and adjust as needed Monitor TPN labs daily   F/Uwith TPN plan on 8/12  Pernell Dupre, PharmD, BCPS Clinical Pharmacist 03/09/2017 4:14 PM

## 2017-03-09 NOTE — Progress Notes (Signed)
[facility] [place] NAME: Kimberly Abbott    MR#:  [id number]  DATE OF BIRTH:  Dec 17, 1920  SUBJECTIVE:  CHIEF COMPLAINT:   Chief Complaint  Patient presents with  . Abdominal Pain  . Emesis  The patient is 83 year old female with past medical history significant for history of hypertension, hyperlipidemia, CAD, gastroesophageal reflux disease, multiple abdominal surgeries, including appendectomy, rectal fistula surgery, tubal ligation, who presents to the hospital with complaints of nausea, vomiting, abdominal pain. CAT scan in the emergency room revealed partial small bowel obstruction. Patient continues to have abdominal distention, nausea, but no vomiting, she is passing some gas per rectum, but no stool. She was seen by surgeon, who recommended NG tube placement and suctioning,   Patient could not tolerate NG tube. AfterFleet enema patient had a  Very small bowel movement 8/10, no other BM's after, Uncomfortable and nauseous   Review of Systems  Constitutional: Negative for chills, fever and weight loss.  HENT: Negative for congestion.   Eyes: Negative for blurred vision and double vision.  Respiratory: Negative for cough, sputum production, shortness of breath and wheezing.   Cardiovascular: Negative for chest pain, palpitations, orthopnea, leg swelling and PND.  Gastrointestinal: Positive for abdominal pain, constipation and nausea. Negative for blood in stool, diarrhea and vomiting.  Genitourinary: Negative for dysuria, frequency, hematuria and urgency.  Musculoskeletal: Negative for falls.  Neurological: Negative for dizziness, tremors, focal weakness and headaches.  Endo/Heme/Allergies: Does not bruise/bleed easily.  Psychiatric/Behavioral: Negative for depression. The patient does not have insomnia.     VITAL SIGNS: Blood pressure (!) 158/96, pulse 98, temperature 97.7 F (36.5 C), temperature source Oral, resp. rate 16,  height 4\' 11"  (1.499 m), weight 51.3 kg (113 lb), SpO2 98 %.  PHYSICAL EXAMINATION:   GENERAL:  83 y.o.-year-old patient lying in the bed with no acute distress.  EYES: Pupils equal, round, reactive to light and accommodation. No scleral icterus. Extraocular muscles intact.  HEENT: Head atraumatic, normocephalic. Oropharynx and nasopharynx clear.  NECK:  Supple, no jugular venous distention. No thyroid enlargement, no tenderness.  LUNGS: Normal breath sounds bilaterally, no wheezing, rales,rhonchi or crepitation. No use of accessory muscles of respiration.  CARDIOVASCULAR: S1, S2 normal. No murmurs, rubs, or gallops.  ABDOMEN: Soft, tender diffusely, but no rebound or guarding, mild distention. Bowel sounds present. No organomegaly or mass.  EXTREMITIES: No pedal edema, cyanosis, or clubbing.  NEUROLOGIC: Cranial nerves II through XII are intact. Muscle strength 5/5 in all extremities. Sensation intact. Gait not checked.  PSYCHIATRIC: The patient is alert and oriented x 3.  SKIN: No obvious rash, lesion, or ulcer.   ORDERS/RESULTS REVIEWED:   CBC  Recent Labs Lab 03/06/17 1103 03/09/17 0354  WBC 10.9 10.0  HGB 11.8* 12.4  HCT 35.0 38.1  PLT 268 264  MCV 87.2 88.6  MCH 29.5 28.8  MCHC 33.8 32.5  RDW 16.2* 16.3*  LYMPHSABS 0.5*  --   MONOABS 0.3  --   EOSABS 0.0  --   BASOSABS 0.1  --    ------------------------------------------------------------------------------------------------------------------  Chemistries   Recent Labs Lab 03/06/17 1103 03/07/17 0444 03/07/17 1516 03/07/17 1921 03/08/17 0418 03/09/17 0354  NA 135 134*  --   --  143 142  K 5.2* 5.2* 5.4* 4.9 4.4 4.1  CL 103 103  --   --  108 111  CO2 21* 23  --   --  25 21*  GLUCOSE 112* 97  --   --  104* 78  BUN 35* 29*  --   --  26* 25*  CREATININE 2.15* 1.71*  --   --  1.65* 1.33*  CALCIUM 10.0 9.3  --   --  9.5 9.4  AST 24  --   --   --   --  27  ALT 16  --   --   --   --  16  ALKPHOS 131*  --   --    --   --  112  BILITOT 0.6  --   --   --   --  0.8   ------------------------------------------------------------------------------------------------------------------ estimated creatinine clearance is 17.6 mL/min (A) (by C-G formula based on SCr of 1.33 mg/dL (H)). ------------------------------------------------------------------------------------------------------------------ No results for input(s): TSH, T4TOTAL, T3FREE, THYROIDAB in the last 72 hours.  Invalid input(s): FREET3  Cardiac Enzymes No results for input(s): CKMB, TROPONINI, MYOGLOBIN in the last 168 hours.  Invalid input(s): CK ------------------------------------------------------------------------------------------------------------------ Invalid input(s): POCBNP ---------------------------------------------------------------------------------------------------------------  RADIOLOGY: Dg Abd 1 View  Result Date: 03/09/2017 CLINICAL DATA:  Abdominal pain and nausea EXAM: ABDOMEN - 1 VIEW COMPARISON:  03/08/2017 FINDINGS: Multiple dilated loops of small bowel are noted. Fecal material is noted throughout the colon. No free air is seen. The degree of small bowel dilatation has increased in the interval from the prior exam. Chronic degenerative changes of lumbar spine are noted. IMPRESSION: Increasing small bowel dilatation. Electronically Signed   By: Inez Catalina M.D.   On: 03/09/2017 08:09   US Renal  Result Date: 03/07/2017 CLINICAL DATA:  Acute renal failure EXAM: RENAL / URINARY TRACT ULTRASOUND COMPLETE COMPARISON:  CT from yesterday FINDINGS: Right Kidney: Length: 8 cm. Probable upper pole cyst measuring 6 mm. No hydronephrosis or solid mass. Left Kidney: Length: 8 cm. Three cysts, measuring up to 22 mm in the lower pole. The cysts have a simple appearance. No hydronephrosis. Bladder: Appears normal for degree of bladder distention. Incidental visualization of dilated bowel, which was seen by CT yesterday. IMPRESSION: 1.  No hydronephrosis. 2. Simple renal cysts. 3. Symmetric renal atrophy. Electronically Signed   By: Monte Fantasia M.D.   On: 03/07/2017 14:58   Dg Abd Portable 2v  Result Date: 03/08/2017 CLINICAL DATA:  Ileus EXAM: PORTABLE ABDOMEN - 2 VIEW COMPARISON:  03/07/2017 abdominal radiographs FINDINGS: Mildly dilated small bowel loops throughout the central abdomen measuring up to 3.4 cm diameter with scattered air-fluid levels, not appreciably changed. Moderate stool throughout the large bowel. No evidence of pneumatosis or pneumoperitoneum. Clear lung bases. Aortic atherosclerosis. No radiopaque urolithiasis. Moderate long segment levocurvature of the thoracolumbar spine with associated marked degenerative changes. IMPRESSION: Stable mildly dilated small bowel loops with fluid levels throughout the central abdomen, which could represent adynamic ileus or partial distal small bowel obstruction . Moderate colonic stool may indicate constipation. Electronically Signed   By: Ilona Sorrel M.D.   On: 03/08/2017 09:20    EKG:  Orders placed or performed during the hospital encounter of 09/05/16  . ED EKG  . ED EKG    ASSESSMENT AND PLAN:  Active Problems:   Partial small bowel obstruction (HCC)   Ileus (HCC)  #1. Partial small bowel obstruction   Patient could not tolerate NG tube placement Continue enema PRN.Lactulose enema Will try ice chips as tolerated  Surgery wants to try Gastrografin study, discussed with family members that agreeable If no improvement with lactulose enema patient might need TPN and PICC line placement. Follow-up with surgery  #2. Acute renal injury, known CAD stage  III, improved on IV fluid administration Clinically improving Monitor renal function and avoid nephrotoxins Creatinine 1.7-1.65-1.33  #3. Hyperkalemia Kayexalate was given yesterday potassium at 4.4, resolved  #4. Hypertension blood pressure is elevated Patient is nauseous and nothing by mouth Will try IV  Lopressor as needed  #5. Anemia, check hemoglobin level in the morning, likely rehydration related, no active bleeding noted.  Management plans discussed with the patient, family and they are in agreement.   DRUG ALLERGIES:  Allergies  Allergen Reactions  . Penicillin G Hives  . Penicillins Hives    Has patient had a PCN reaction causing immediate rash, facial/tongue/throat swelling, SOB or lightheadedness with hypotension: No Has patient had a PCN reaction causing severe rash involving mucus membranes or skin necrosis: No Has patient had a PCN reaction that required hospitalization No Has patient had a PCN reaction occurring within the last 10 years: No If all of the above answers are "NO", then may proceed with Cephalosporin use.    CODE STATUS:     Code Status Orders        Start     Ordered   03/06/17 2034  Full code  Continuous     03/06/17 2033    Code Status History    Date Active Date Inactive Code Status Order ID Comments User Context   06/08/2016 11:37 AM 06/10/2016  3:53 PM Full Code [id number]  Fritzi Mandes, MD Inpatient    Advance Directive Documentation     Most Recent Value  Type of Advance Directive  Healthcare Power of [place], Living will  Pre-existing out of facility DNR order (yellow form or pink MOST form)  -  "MOST" Form in Place?  -      TOTAL TIME TAKING CARE OF THIS PATIENT: 35 minutes.    Nicholes Mango M.D on 03/09/2017 at 11:28 AM  Between 7am to 6pm - Pager - [phone]  After 6pm go to [email or web address] - password [email or web address] [facility]  Office  [phone]  CC: Primary care physician; Adline Potter, MD

## 2017-03-09 NOTE — Progress Notes (Signed)
Call Dr. Margaretmary Eddy pt was hypertensive 163/73 MD order metoprolol 5 mg. Recheck bp was 180/89. Given metoprolol 10 mg as order.  Recheck BP was 168/74.  Will notify MD and wait for more orders.

## 2017-03-09 NOTE — Plan of Care (Signed)
Problem: Nutrition: Goal: Adequate nutrition will be maintained Outcome: Not Progressing Patient is NPO.   

## 2017-03-09 NOTE — Progress Notes (Signed)
Ileus vs SBO No flatus Xray reviewed persistent dilated loops She feels nauseated  PE NAD Abd: soft, mild diffuse tenderness, no peritonitis  A/P SBO vs ileus Start TPN Gastrografin challenge D/w family extensively about potential intervention of gastrografin study is c/w SBO They would like to think about it I spent about 40 min is encounter with greater than 50% spent in coordination and counseling of care

## 2017-03-10 ENCOUNTER — Inpatient Hospital Stay: Payer: Medicare HMO

## 2017-03-10 DIAGNOSIS — E43 Unspecified severe protein-calorie malnutrition: Secondary | ICD-10-CM | POA: Insufficient documentation

## 2017-03-10 LAB — CBC
HCT: 36.7 % (ref 35.0–47.0)
Hemoglobin: 12 g/dL (ref 12.0–16.0)
MCH: 28.5 pg (ref 26.0–34.0)
MCHC: 32.7 g/dL (ref 32.0–36.0)
MCV: 87.3 fL (ref 80.0–100.0)
PLATELETS: 258 10*3/uL (ref 150–440)
RBC: 4.2 MIL/uL (ref 3.80–5.20)
RDW: 15.9 % — ABNORMAL HIGH (ref 11.5–14.5)
WBC: 13 10*3/uL — ABNORMAL HIGH (ref 3.6–11.0)

## 2017-03-10 LAB — COMPREHENSIVE METABOLIC PANEL
ALK PHOS: 101 U/L (ref 38–126)
ALT: 14 U/L (ref 14–54)
ANION GAP: 8 (ref 5–15)
AST: 22 U/L (ref 15–41)
Albumin: 3.4 g/dL — ABNORMAL LOW (ref 3.5–5.0)
BUN: 30 mg/dL — ABNORMAL HIGH (ref 6–20)
CALCIUM: 9.4 mg/dL (ref 8.9–10.3)
CO2: 23 mmol/L (ref 22–32)
CREATININE: 1.45 mg/dL — AB (ref 0.44–1.00)
Chloride: 114 mmol/L — ABNORMAL HIGH (ref 101–111)
GFR, EST AFRICAN AMERICAN: 35 mL/min — AB (ref >60)
GFR, EST NON AFRICAN AMERICAN: 30 mL/min — AB (ref >60)
Glucose, Bld: 181 mg/dL — ABNORMAL HIGH (ref 65–99)
Potassium: 3.3 mmol/L — ABNORMAL LOW (ref 3.5–5.1)
Sodium: 145 mmol/L (ref 135–145)
Total Bilirubin: 0.6 mg/dL (ref 0.3–1.2)
Total Protein: 6.7 g/dL (ref 6.5–8.1)

## 2017-03-10 LAB — PROTIME-INR
INR: 1
Prothrombin Time: 13.2 seconds (ref 11.4–15.2)

## 2017-03-10 LAB — GLUCOSE, CAPILLARY
GLUCOSE-CAPILLARY: 161 mg/dL — AB (ref 65–99)
Glucose-Capillary: 173 mg/dL — ABNORMAL HIGH (ref 65–99)
Glucose-Capillary: 167 mg/dL — ABNORMAL HIGH (ref 65–99)
Glucose-Capillary: 141 mg/dL — ABNORMAL HIGH (ref 65–99)
Glucose-Capillary: 159 mg/dL — ABNORMAL HIGH (ref 65–99)

## 2017-03-10 LAB — APTT: aPTT: 47 seconds — ABNORMAL HIGH (ref 24–36)

## 2017-03-10 LAB — PHOSPHORUS: PHOSPHORUS: 3.4 mg/dL (ref 2.5–4.6)

## 2017-03-10 LAB — MAGNESIUM: MAGNESIUM: 2 mg/dL (ref 1.7–2.4)

## 2017-03-10 MED ORDER — FAT EMULSION 20 % IV EMUL
250.0000 mL | INTRAVENOUS | Status: AC
  Administered 2017-03-10: 250 mL via INTRAVENOUS
  Filled 2017-03-10: qty 250

## 2017-03-10 MED ORDER — PANTOPRAZOLE SODIUM 40 MG IV SOLR
40.0000 mg | Freq: Two times a day (BID) | INTRAVENOUS | Status: DC
  Administered 2017-03-10 – 2017-03-19 (×18): 40 mg via INTRAVENOUS
  Filled 2017-03-10 (×18): qty 40

## 2017-03-10 MED ORDER — POTASSIUM CHLORIDE 10 MEQ/100ML IV SOLN
10.0000 meq | INTRAVENOUS | Status: AC
  Administered 2017-03-10 (×4): 10 meq via INTRAVENOUS
  Filled 2017-03-10 (×4): qty 100

## 2017-03-10 MED ORDER — M.V.I. ADULT IV INJ
INJECTION | INTRAVENOUS | Status: AC
  Administered 2017-03-10: 17:00:00 via INTRAVENOUS
  Filled 2017-03-10: qty 1560

## 2017-03-10 NOTE — Progress Notes (Signed)
[name] NAME: Kimberly Abbott    MR#:  [id number]  DATE OF BIRTH:  May 29, 1921  SUBJECTIVE:  CHIEF COMPLAINT:   Chief Complaint  Patient presents with  . Abdominal Pain  . Emesis  The patient is 83 year old female with past medical history significant for history of hypertension, hyperlipidemia, CAD, gastroesophageal reflux disease, multiple abdominal surgeries, including appendectomy, rectal fistula surgery, tubal ligation, who presents to the hospital with complaints of nausea, vomiting, abdominal pain. CAT scan in the emergency room revealed partial small bowel obstruction. Patient continues to have abdominal distention, nausea, but no vomiting, she is passing some gas per rectum, but no stool. She was seen by surgeon, who recommended NG tube placement and suctioning,   Patient had several BM's last night and today after Lactulose enema, hungry and wants to eat today   Review of Systems  Constitutional: Negative for chills, fever and weight loss.  HENT: Negative for congestion.   Eyes: Negative for blurred vision and double vision.  Respiratory: Negative for cough, sputum production, shortness of breath and wheezing.   Cardiovascular: Negative for chest pain, palpitations, orthopnea, leg swelling and PND.  Gastrointestinal: Negative for abdominal pain, blood in stool, constipation, diarrhea, nausea and vomiting.  Genitourinary: Negative for dysuria, frequency, hematuria and urgency.  Musculoskeletal: Negative for falls.  Neurological: Negative for dizziness, tremors, focal weakness and headaches.  Endo/Heme/Allergies: Does not bruise/bleed easily.  Psychiatric/Behavioral: Negative for depression. The patient does not have insomnia.     VITAL SIGNS: Blood pressure 137/72, pulse 81, temperature 98.2 F (36.8 C), temperature source Oral, resp. rate 18, height 4\' 11"  (1.499 m), weight 51.3 kg (113 lb), SpO2 98  %.  PHYSICAL EXAMINATION:   GENERAL:  83 y.o.-year-old patient lying in the bed with no acute distress.  EYES: Pupils equal, round, reactive to light and accommodation. No scleral icterus. Extraocular muscles intact.  HEENT: Head atraumatic, normocephalic. Oropharynx and nasopharynx clear.  NECK:  Supple, no jugular venous distention. No thyroid enlargement, no tenderness.  LUNGS: Normal breath sounds bilaterally, no wheezing, rales,rhonchi or crepitation. No use of accessory muscles of respiration.  CARDIOVASCULAR: S1, S2 normal. No murmurs, rubs, or gallops.  ABDOMEN: Soft, ,notnderno rebound or guarding,  nodistention. Bowel sounds present. No organomegaly  EXTREMITIES: No pedal edema, cyanosis, or clubbing.  NEUROLOGIC: Cranial nerves II through XII are intact. Muscle strength  at has baseline Sensation intact. Gait not checked.  PSYCHIATRIC: The patient is alert and oriented x 3.  SKIN: No obvious rash, lesion, or ulcer.   ORDERS/RESULTS REVIEWED:   CBC  Recent Labs Lab 03/06/17 1103 03/09/17 0354 03/10/17 0332  WBC 10.9 10.0 13.0*  HGB 11.8* 12.4 12.0  HCT 35.0 38.1 36.7  PLT 268 264 258  MCV 87.2 88.6 87.3  MCH 29.5 28.8 28.5  MCHC 33.8 32.5 32.7  RDW 16.2* 16.3* 15.9*  LYMPHSABS 0.5*  --   --   MONOABS 0.3  --   --   EOSABS 0.0  --   --   BASOSABS 0.1  --   --    ------------------------------------------------------------------------------------------------------------------  Chemistries   Recent Labs Lab 03/06/17 1103 03/07/17 0444 03/07/17 1516 03/07/17 1921 03/08/17 0418 03/09/17 0354 03/09/17 1516 03/10/17 0332  NA 135 134*  --   --  143 142  --  145  K 5.2* 5.2* 5.4* 4.9 4.4 4.1  --  3.3*  CL 103 103  --   --  108 111  --  114*  CO2 21* 23  --   --  25 21*  --  23  GLUCOSE 112* 97  --   --  104* 78  --  181*  BUN 35* 29*  --   --  26* 25*  --  30*  CREATININE 2.15* 1.71*  --   --  1.65* 1.33*  --  1.45*  CALCIUM 10.0 9.3  --   --  9.5 9.4  --   9.4  MG  --   --   --   --   --   --  2.0 2.0  AST 24  --   --   --   --  27  --  22  ALT 16  --   --   --   --  16  --  14  ALKPHOS 131*  --   --   --   --  112  --  101  BILITOT 0.6  --   --   --   --  0.8  --  0.6   ------------------------------------------------------------------------------------------------------------------ estimated creatinine clearance is 16.2 mL/min (A) (by C-G formula based on SCr of 1.45 mg/dL (H)). ------------------------------------------------------------------------------------------------------------------ No results for input(s): TSH, T4TOTAL, T3FREE, THYROIDAB in the last 72 hours.  Invalid input(s): FREET3  Cardiac Enzymes No results for input(s): CKMB, TROPONINI, MYOGLOBIN in the last 168 hours.  Invalid input(s): CK ------------------------------------------------------------------------------------------------------------------ Invalid input(s): POCBNP ---------------------------------------------------------------------------------------------------------------  RADIOLOGY: Dg Abd 1 View  Result Date: 03/09/2017 CLINICAL DATA:  Small bowel dilatation EXAM: ABDOMEN - 1 VIEW COMPARISON:  Film from earlier in the same day FINDINGS: Contrast was administered orally and initial film obtained. Stomach is distended with contrast material. A small amount of contrast is noted within the proximal small bowel. Follow-up imaging is recommended. IMPRESSION: Minimal contrast within the small bowel. Dilatation stomach is noted. Persistent small bowel dilatation is seen as well. Electronically Signed   By: Inez Catalina M.D.   On: 03/09/2017 12:48   Dg Abd 1 View  Result Date: 03/09/2017 CLINICAL DATA:  Abdominal pain and nausea EXAM: ABDOMEN - 1 VIEW COMPARISON:  03/08/2017 FINDINGS: Multiple dilated loops of small bowel are noted. Fecal material is noted throughout the colon. No free air is seen. The degree of small bowel dilatation has increased in the interval  from the prior exam. Chronic degenerative changes of lumbar spine are noted. IMPRESSION: Increasing small bowel dilatation. Electronically Signed   By: Inez Catalina M.D.   On: 03/09/2017 08:09   Dg Abd Portable 1v  Result Date: 03/09/2017 CLINICAL DATA:  8 hour follow-up for small bowel obstruction study. Initial encounter. EXAM: PORTABLE ABDOMEN - 1 VIEW COMPARISON:  Abdominal radiograph performed earlier today at 12:17 p.m. FINDINGS: Contrast noted within the stomach and small bowel. There is persistent diffuse dilatation of the small bowel to 4.3 cm in maximal diameter, concerning for small bowel obstruction. Mild degenerative change is noted along the lower thoracic and lumbar spine. No free intra-abdominal air is seen, though evaluation for free air is limited on a single supine view. IMPRESSION: Contrast noted within the stomach and small bowel. Persistent diffuse dilatation of the small bowel to 4.3 cm in maximal diameter, concerning for persistent small bowel obstruction. Electronically Signed   By: Garald Balding M.D.   On: 03/09/2017 22:18   Dg Abd Portable 2v  Result Date: 03/10/2017 CLINICAL DATA:  20 hour follow-up from contrast administration EXAM: PORTABLE ABDOMEN - 2 VIEW COMPARISON:  03/09/2017 FINDINGS: Considerable contrast  remains within the stomach. Contrast is noted throughout the small bowel with areas of diffuse dilatation similar to that seen on prior exam. No free air is noted. No definitive colonic contrast is seen. IMPRESSION: Contrast within the stomach and small bowel with persistent small bowel dilatation. No definitive colonic contrast is noted. Electronically Signed   By: Inez Catalina M.D.   On: 03/10/2017 08:39    EKG:  Orders placed or performed during the hospital encounter of 09/05/16  . ED EKG  . ED EKG    ASSESSMENT AND PLAN:  Active Problems:   Partial small bowel obstruction (HCC)   Ileus (HCC)  #1. Partial small bowel obstruction    clinically  resolved with enemas.  Currently on TPN , would consider clear liquid diet and advance as tolerated if ok with surgery, d/w dr.Pabon, will review abd xray results and f/u   #2. Acute renal injury, known CAD stage III, improved on IV fluid administration Clinically improving Monitor renal function and avoid nephrotoxins Creatinine 1.7-1.65-1.33 -1.45   #3. Hypokalemia Replete and recheck  #4. Hypertension- blood pressure improved    #5. Anemia, check hemoglobin level in the morning, likely rehydration related, no active bleeding noted.  PT recommends HHPT  Management plans discussed with the patient, family and they are in agreement.   DRUG ALLERGIES:  Allergies  Allergen Reactions  . Penicillin G Hives  . Penicillins Hives    Has patient had a PCN reaction causing immediate rash, facial/tongue/throat swelling, SOB or lightheadedness with hypotension: No Has patient had a PCN reaction causing severe rash involving mucus membranes or skin necrosis: No Has patient had a PCN reaction that required hospitalization No Has patient had a PCN reaction occurring within the last 10 years: No If all of the above answers are "NO", then may proceed with Cephalosporin use.    CODE STATUS:     Code Status Orders        Start     Ordered   03/06/17 2034  Full code  Continuous     03/06/17 2033    Code Status History    Date Active Date Inactive Code Status Order ID Comments User Context   06/08/2016 11:37 AM 06/10/2016  3:53 PM Full Code [id number]  Fritzi Mandes, MD Inpatient    Advance Directive Documentation     Most Recent Value  Type of Advance Directive  Healthcare Power of [place], Living will  Pre-existing out of facility DNR order (yellow form or pink MOST form)  -  "MOST" Form in Place?  -      TOTAL TIME TAKING CARE OF THIS PATIENT: 35 minutes.    Nicholes Mango M.D on 03/10/2017 at 10:03 AM  Between 7am to 6pm - Pager - [phone]  After 6pm go to [email or web address]  - password [email or web address] [facility]  Office  [phone]  CC: Primary care physician; Adline Potter, MD

## 2017-03-10 NOTE — Plan of Care (Signed)
Problem: Fluid Volume: Goal: Ability to maintain a balanced intake and output will improve Outcome: Progressing Patient started on clear liquid diet

## 2017-03-10 NOTE — Plan of Care (Signed)
Problem: Fluid Volume: Goal: Ability to maintain a balanced intake and output will improve Outcome: Not Progressing Patient only eating ice chips.  Problem: Nutrition: Goal: Adequate nutrition will be maintained Outcome: Not Progressing Patient only eating ice chips and is on TPN.

## 2017-03-10 NOTE — Progress Notes (Addendum)
Initial Nutrition Assessment  DOCUMENTATION CODES:   Severe malnutrition in context of chronic illness  INTERVENTION:   TPN- Clinimix 5%/15% with electrolytes (add MVI and trace elements)- recommend advance to goal rate '@65ml' /hr  Add 20% lipids at 26m/hr x 12 hrs  Regimen provides 1468kcal/day, 78g/day protein, and 15640mvolume  Recommend discontinue IVFs  Continue to monitor and supplement K, Mg, and P as needed  Monitor daily weights   Recommend continue TPN until pt is able to meet 75% of her estimated needs via oral intake.  NUTRITION DIAGNOSIS:   Malnutrition (severe) related to poor appetite (advanced age) as evidenced by severe depletion of muscle mass, severe depletion of body fat.  GOAL:   Patient will meet greater than or equal to 90% of their needs  MONITOR:   Diet advancement, Labs, Weight trends, I & O's, Other (Comment) (TPN)  REASON FOR ASSESSMENT:   Consult New TPN/TNA  ASSESSMENT:   [age]ear old female with past medical history significant for history of hypertension, hyperlipidemia, CAD, gastroesophageal reflux disease, multiple abdominal surgeries, including appendectomy, rectal fistula surgery, tubal ligation, who presents to the hospital with complaints of nausea, vomiting, abdominal pain. CAT scan in the emergency room revealed partial small bowel obstruction.   Met with pt in room today. Pt reports that her appetite and oral intake has been slowly decreasing for the past 4 years; this started after her husband passed away. Pt reports that she used to weight 136-140lbs but has slowly lost down to around 105-110lbs. Per chart, pt has lost 6lbs(5%) over the past 6 months which is not significant given the time frame. Pt reports that she has not eaten anything since 8/6 r/t nausea and abdominal pain. Pt reports that she has been having tolerable abdominal pain on and off for years; she reports that it usually occurs early in the morning and late at  night. She does not feel like it gets worse with certain foods. Pt also reports a history of acid reflux and trouble swallowing. She feels like food gets stuck in her throat and she will have to spit it back up. Pt is complaining of reflux at time of RD visit today and reports that she gets spitting up acid. RD spoke to RN about starting acid reducers for this pt. Pt was seen by SLP in 05/2016 and noted to have history of esophageal stricture and hiatal hernia; SLP recommend GI evaluation at that time with DYS 3/thin liquid diet. Pt reports that she does drink vanilla Ensure at home; she drinks about 1 Ensure every 2-3 days. RD encouraged pt to drink Ensure daily.   Pt noted to have SBO/ileus. Pt initially had an NGT but was unable to tolerate it. Pt had PICC line placed 8/11 and was initiated on TPN. Pt is at moderate risk for refeeding syndrome. Pt has tolerated TPN thus far. Recommend advance to goal today and continue to monitor and supplement K, Mg, and P as needed. Recommend discontinue IVFs; RD will order daily weights. Recommend continue TPN until pt is able to meet 75% of her estimated needs via oral intake. SBO seems to be resolving with enemas. Patient had several BM's last night and today after Lactulose enema.      Medications reviewed and include: heparin, insulin, phenergan  Labs reviewed: K 3.3(L), Cl 114(H), BUN 30(H), creat 1.45(H), alb 3.4(L) Triglycerides- 101- 8/11 Wbc- 13.0(H) cbgs- 104, 78, 181 x 48 hrs  Nutrition-Focused physical exam completed. Findings are severe fat and muscle depletions  over entire body, and mild edema in BLE.   Diet Order:  .TPN (CLINIMIX-E) Adult  Skin:  Reviewed, no issues  Last BM:  8/12-type 6  Height:   Ht Readings from Last 1 Encounters:  03/06/17 '4\' 11"'  (1.499 m)    Weight:   Wt Readings from Last 1 Encounters:  03/06/17 113 lb (51.3 kg)    Ideal Body Weight:     BMI:  Body mass index is 22.82 kg/m.  Estimated Nutritional Needs:    Kcal:  1200-1500kcal/day   Protein:  67-77g/day   Fluid:  >1.5L/day   EDUCATION NEEDS:   No education needs identified at this time  Koleen Distance MS, RD, [place] Pager #[phone] After Hours Pager: [phone]

## 2017-03-10 NOTE — Progress Notes (Signed)
Partial SBO kun reviewed showing persistent dilated loops of small bowel. No free air Patient however feels much better with no abdominal pain today. She had multiple bowel movements yesterday after the enema. AVSS Wbc 12 She is hungry  PE NAD Abd: soft, mildly distended and minimal diffuse tendernes w/o peritonitis  A/P partial SBO Start sips of clears only Is with the patient in detail and with the family about her condition. I do not think that she is out of the woods yet and she might still have a mechanical obstruction. However given her age, malnutrition and inability I am very hesitant about performing any surgical intervention. I informed to the family that if we had to operate on her there will be significant risk and a significant mortality. Currently I do not think that she needs an emergency operation at this time. I did encourage the family to think about end-of-life issues as well as make a final determination whether or not she will be willing to have a potential surgery in case a life threatening circumstance arise . They will think about it but lean towards no surgical intervention  I Spent about 40 minutes in this encounter with greater than 50% spent in counseling and coordination of care

## 2017-03-10 NOTE — Progress Notes (Signed)
PHARMACY - ADULT TOTAL PARENTERAL NUTRITION CONSULT NOTE   Pharmacy Consult for TPN ordering and monitoring  Indication: Severe malnutrition in context of chronic illness; prolonged Ileus   Patient Measurements:  Height: 4\' 11"  (149.9 cm) Weight: 113 lb (51.3 kg) IBW/kg (Calculated) : 43.2 TPN AdjBW (KG): 51.3 Body mass index is 22.82 kg/m.   Assessment: 83 yo female with prolonged Ileus (SBO vs Ileus)   GI: LBM: 8/10 Endo: BGs: stable  Insulin requirements in the past 24 hours: 7 units Lytes:  K: 3.3,  Mg: 2.0,  Phos: 3.4 Renal:Scr: 1.45,  CrCl: 16.2  Best Practices: TPN Access:8/11 TPN start date:8/11  Nutritional Goals (per RD recommendation on 8/12):  KCal: 1200-1500kcal/day Protein: 67-77 g/day Fluid: >1.5L/day  Current Nutrition: NS 76ml/hr, TPN (Clinimix E 5/15) at 40 ml/hr.  Plan:  Recommend discontinue IVFs.   Ordered KCl 64meq IV this morning. Continue to monitor and supplement K, Mg, and P as needed  TPN- Clinimix 5%/15% with electrolytes (add MVI and trace elements)- advance to goal rate @65ml /hr.  Add 20% lipids at 82ml/hr x 12 hrs. Regimen provides 1468kcal/day, 78g/day protein, and 1541ml volume  Every 4 hours SSI ordered and adjust as needed  Monitor TPN labs daily   Olivia Canter, [place] Pharmacist 03/10/2017 11:03 AM

## 2017-03-11 ENCOUNTER — Inpatient Hospital Stay: Payer: Medicare HMO

## 2017-03-11 LAB — COMPREHENSIVE METABOLIC PANEL
ALT: 14 U/L (ref 14–54)
AST: 25 U/L (ref 15–41)
Albumin: 3.5 g/dL (ref 3.5–5.0)
Alkaline Phosphatase: 89 U/L (ref 38–126)
Anion gap: 7 (ref 5–15)
BILIRUBIN TOTAL: 0.3 mg/dL (ref 0.3–1.2)
BUN: 39 mg/dL — AB (ref 6–20)
CO2: 25 mmol/L (ref 22–32)
Calcium: 9.6 mg/dL (ref 8.9–10.3)
Chloride: 112 mmol/L — ABNORMAL HIGH (ref 101–111)
Creatinine, Ser: 1.12 mg/dL — ABNORMAL HIGH (ref 0.44–1.00)
GFR calc Af Amer: 47 mL/min — ABNORMAL LOW (ref >60)
GFR, EST NON AFRICAN AMERICAN: 41 mL/min — AB (ref >60)
Glucose, Bld: 163 mg/dL — ABNORMAL HIGH (ref 65–99)
POTASSIUM: 4.1 mmol/L (ref 3.5–5.1)
Sodium: 144 mmol/L (ref 135–145)
TOTAL PROTEIN: 6.9 g/dL (ref 6.5–8.1)

## 2017-03-11 LAB — CBC
HCT: 40.2 % (ref 35.0–47.0)
Hemoglobin: 13 g/dL (ref 12.0–16.0)
MCH: 28.4 pg (ref 26.0–34.0)
MCHC: 32.4 g/dL (ref 32.0–36.0)
MCV: 87.6 fL (ref 80.0–100.0)
PLATELETS: 264 10*3/uL (ref 150–440)
RBC: 4.59 MIL/uL (ref 3.80–5.20)
RDW: 16.2 % — AB (ref 11.5–14.5)
WBC: 20.3 10*3/uL — ABNORMAL HIGH (ref 3.6–11.0)

## 2017-03-11 LAB — GLUCOSE, CAPILLARY
GLUCOSE-CAPILLARY: 162 mg/dL — AB (ref 65–99)
GLUCOSE-CAPILLARY: 152 mg/dL — AB (ref 65–99)
Glucose-Capillary: 128 mg/dL — ABNORMAL HIGH (ref 65–99)
Glucose-Capillary: 149 mg/dL — ABNORMAL HIGH (ref 65–99)
Glucose-Capillary: 159 mg/dL — ABNORMAL HIGH (ref 65–99)
Glucose-Capillary: 170 mg/dL — ABNORMAL HIGH (ref 65–99)

## 2017-03-11 LAB — PHOSPHORUS: Phosphorus: 1.3 mg/dL — ABNORMAL LOW (ref 2.5–4.6)

## 2017-03-11 LAB — TRIGLYCERIDES: TRIGLYCERIDES: 97 mg/dL (ref <150)

## 2017-03-11 LAB — MAGNESIUM: MAGNESIUM: 2.1 mg/dL (ref 1.7–2.4)

## 2017-03-11 MED ORDER — PROCHLORPERAZINE EDISYLATE 5 MG/ML IJ SOLN
5.0000 mg | INTRAMUSCULAR | Status: DC | PRN
  Administered 2017-03-11: 5 mg via INTRAVENOUS
  Filled 2017-03-11 (×2): qty 1

## 2017-03-11 MED ORDER — ONDANSETRON HCL 4 MG/2ML IJ SOLN
4.0000 mg | Freq: Four times a day (QID) | INTRAMUSCULAR | Status: DC | PRN
  Administered 2017-03-11 – 2017-03-16 (×2): 4 mg via INTRAVENOUS
  Filled 2017-03-11 (×3): qty 2

## 2017-03-11 MED ORDER — FAT EMULSION 20 % IV EMUL
250.0000 mL | INTRAVENOUS | Status: AC
  Administered 2017-03-11: 250 mL via INTRAVENOUS
  Filled 2017-03-11: qty 250

## 2017-03-11 MED ORDER — BUTAMBEN-TETRACAINE-BENZOCAINE 2-2-14 % EX AERO
1.0000 | INHALATION_SPRAY | Freq: Once | CUTANEOUS | Status: AC
  Administered 2017-03-11: 1 via TOPICAL
  Filled 2017-03-11: qty 20

## 2017-03-11 MED ORDER — SODIUM CHLORIDE 0.9% FLUSH: 10.0000 mL | INTRAVENOUS | Status: DC | PRN

## 2017-03-11 MED ORDER — PHENOL 1.4 % MT LIQD
1.0000 | OROMUCOSAL | Status: DC | PRN
  Administered 2017-03-11 – 2017-03-12 (×2): 1 via OROMUCOSAL
  Filled 2017-03-11: qty 177

## 2017-03-11 MED ORDER — TRACE MINERALS CR-CU-MN-SE-ZN 10-1000-500-60 MCG/ML IV SOLN
INTRAVENOUS | Status: AC
  Administered 2017-03-11: 18:00:00 via INTRAVENOUS
  Filled 2017-03-11: qty 1560

## 2017-03-11 MED ORDER — SODIUM PHOSPHATES 45 MMOLE/15ML IV SOLN
15.0000 mmol | Freq: Once | INTRAVENOUS | Status: AC
  Administered 2017-03-11: 15 mmol via INTRAVENOUS
  Filled 2017-03-11: qty 5

## 2017-03-11 NOTE — Progress Notes (Signed)
MD Rosana Hoes putting in NG tube.  Order received for cetacain for NG tube placement

## 2017-03-11 NOTE — Progress Notes (Signed)
[facility] NAME: Kimberly Abbott    MR#:  [id number]  DATE OF BIRTH:  1920-12-23  SUBJECTIVE:  CHIEF COMPLAINT:   Chief Complaint  Patient presents with  . Abdominal Pain  . Emesis  The patient is 83 year old female with past medical history significant for history of hypertension, hyperlipidemia, CAD, gastroesophageal reflux disease, multiple abdominal surgeries, including appendectomy, rectal fistula surgery, tubal ligation, who presents to the hospital with complaints of nausea, vomiting, abdominal pain. CAT scan in the emergency room revealed partial small bowel obstruction. Patient continues to have abdominal distention, nausea, but no vomiting, she is passing some gas per rectum, but no stool. She was seen by surgeon, who recommended NG tube placement and suctioning,   Patient had several BM's On August 11 and 12th last  after Lactulose enema, today patient is not sure whether she is passing flatness are not reporting abdominal discomfort and nausea   Review of Systems  Constitutional: Negative for chills, fever and weight loss.  HENT: Negative for congestion.   Eyes: Negative for blurred vision and double vision.  Respiratory: Negative for cough, sputum production, shortness of breath and wheezing.   Cardiovascular: Negative for chest pain, palpitations, orthopnea, leg swelling and PND.  Gastrointestinal: Positive for abdominal pain, constipation and nausea. Negative for blood in stool, diarrhea and vomiting.  Genitourinary: Negative for dysuria, frequency, hematuria and urgency.  Musculoskeletal: Negative for falls.  Neurological: Negative for dizziness, tremors, focal weakness and headaches.  Endo/Heme/Allergies: Does not bruise/bleed easily.  Psychiatric/Behavioral: Negative for depression. The patient does not have insomnia.     VITAL SIGNS: Blood pressure (!) 107/59, pulse 94, temperature (!) 97.3 F (36.3 C),  temperature source Oral, resp. rate 18, height 4\' 11"  (1.499 m), weight 51.9 kg (114 lb 6.4 oz), SpO2 98 %.  PHYSICAL EXAMINATION:   GENERAL:  83 y.o.-year-old patient lying in the bed with no acute distress.  EYES: Pupils equal, round, reactive to light and accommodation. No scleral icterus. Extraocular muscles intact.  HEENT: Head atraumatic, normocephalic. Oropharynx and nasopharynx clear.  NECK:  Supple, no jugular venous distention. No thyroid enlargement, no tenderness.  LUNGS: Normal breath sounds bilaterally, no wheezing, rales,rhonchi or crepitation. No use of accessory muscles of respiration.  CARDIOVASCULAR: S1, S2 normal. No murmurs, rubs, or gallops.  ABDOMEN: Soft, hypoactive minimal bowel sounds and some abdominal distention,  no guarding  EXTREMITIES: No pedal edema, cyanosis, or clubbing.  NEUROLOGIC: Cranial nerves II through XII are intact. Muscle strength  at has baseline Sensation intact. Gait not checked.  PSYCHIATRIC: The patient is alert and oriented x 3.  SKIN: No obvious rash, lesion, or ulcer.   ORDERS/RESULTS REVIEWED:   CBC  Recent Labs Lab 03/06/17 1103 03/09/17 0354 03/10/17 0332 03/11/17 0401  WBC 10.9 10.0 13.0* 20.3*  HGB 11.8* 12.4 12.0 13.0  HCT 35.0 38.1 36.7 40.2  PLT 268 264 258 264  MCV 87.2 88.6 87.3 87.6  MCH 29.5 28.8 28.5 28.4  MCHC 33.8 32.5 32.7 32.4  RDW 16.2* 16.3* 15.9* 16.2*  LYMPHSABS 0.5*  --   --   --   MONOABS 0.3  --   --   --   EOSABS 0.0  --   --   --   BASOSABS 0.1  --   --   --    ------------------------------------------------------------------------------------------------------------------  Chemistries   Recent Labs Lab 03/06/17 1103 03/07/17 0444  03/07/17 1921 03/08/17 0418 03/09/17 0354 03/09/17 1516  03/10/17 0332 03/11/17 0401  NA 135 134*  --   --  143 142  --  145 144  K 5.2* 5.2*  < > 4.9 4.4 4.1  --  3.3* 4.1  CL 103 103  --   --  108 111  --  114* 112*  CO2 21* 23  --   --  25 21*  --  23  25  GLUCOSE 112* 97  --   --  104* 78  --  181* 163*  BUN 35* 29*  --   --  26* 25*  --  30* 39*  CREATININE 2.15* 1.71*  --   --  1.65* 1.33*  --  1.45* 1.12*  CALCIUM 10.0 9.3  --   --  9.5 9.4  --  9.4 9.6  MG  --   --   --   --   --   --  2.0 2.0 2.1  AST 24  --   --   --   --  27  --  22 25  ALT 16  --   --   --   --  16  --  14 14  ALKPHOS 131*  --   --   --   --  112  --  101 89  BILITOT 0.6  --   --   --   --  0.8  --  0.6 0.3  < > = values in this interval not displayed. ------------------------------------------------------------------------------------------------------------------ estimated creatinine clearance is 22.6 mL/min (A) (by C-G formula based on SCr of 1.12 mg/dL (H)). ------------------------------------------------------------------------------------------------------------------ No results for input(s): TSH, T4TOTAL, T3FREE, THYROIDAB in the last 72 hours.  Invalid input(s): FREET3  Cardiac Enzymes No results for input(s): CKMB, TROPONINI, MYOGLOBIN in the last 168 hours.  Invalid input(s): CK ------------------------------------------------------------------------------------------------------------------ Invalid input(s): POCBNP ---------------------------------------------------------------------------------------------------------------  RADIOLOGY: Dg Abd Portable 1v  Result Date: 03/09/2017 CLINICAL DATA:  8 hour follow-up for small bowel obstruction study. Initial encounter. EXAM: PORTABLE ABDOMEN - 1 VIEW COMPARISON:  Abdominal radiograph performed earlier today at 12:17 p.m. FINDINGS: Contrast noted within the stomach and small bowel. There is persistent diffuse dilatation of the small bowel to 4.3 cm in maximal diameter, concerning for small bowel obstruction. Mild degenerative change is noted along the lower thoracic and lumbar spine. No free intra-abdominal air is seen, though evaluation for free air is limited on a single supine view. IMPRESSION:  Contrast noted within the stomach and small bowel. Persistent diffuse dilatation of the small bowel to 4.3 cm in maximal diameter, concerning for persistent small bowel obstruction. Electronically Signed   By: Garald Balding M.D.   On: 03/09/2017 22:18   Dg Abd Portable 2v  Result Date: 03/11/2017 CLINICAL DATA:  Small bowel obstruction. EXAM: PORTABLE ABDOMEN - 2 VIEW COMPARISON:  Abdominal radiographs 03/10/2017. FINDINGS: The heart is enlarged. Increasing airspace disease is present at the left base. Multiple gas-filled loops of small bowel are again seen with slight decrease in distention. There is no free air. Gas and contrast are noted within the distal colon. There is persistent distention of the stomach. IMPRESSION: 1. Slight decrease in distention of small bowel with multiple dilated loops remaining compatible with resolving small bowel obstruction versus ileus. 2. Increasing left lower lobe airspace disease. While this may represent atelectasis, infection or aspiration is also considered. Electronically Signed   By: San Morelle M.D.   On: 03/11/2017 07:12   Dg Abd Portable 2v  Result Date: 03/10/2017 CLINICAL DATA:  20 hour follow-up from contrast administration EXAM: PORTABLE ABDOMEN - 2 VIEW COMPARISON:  03/09/2017 FINDINGS: Considerable contrast remains within the stomach. Contrast is noted throughout the small bowel with areas of diffuse dilatation similar to that seen on prior exam. No free air is noted. No definitive colonic contrast is seen. IMPRESSION: Contrast within the stomach and small bowel with persistent small bowel dilatation. No definitive colonic contrast is noted. Electronically Signed   By: Inez Catalina M.D.   On: 03/10/2017 08:39    EKG:  Orders placed or performed during the hospital encounter of 09/05/16  . ED EKG  . ED EKG    ASSESSMENT AND PLAN:  Active Problems:   Partial small bowel obstruction (HCC)   Ileus (HCC)   Protein-calorie malnutrition,  severe  #1. Partial small bowel obstruction    Clear liquid diet for now and surgery is recommending NG tube placement if pain gets worse,  Currently on TPN , Discussed with Dr. Rosana Hoes general surgery  ambulate with assistance as tolerated  #2. Acute renal injury, known CAD stage III, improved on IV fluid administration Clinically improving Monitor renal function and avoid nephrotoxins Creatinine 1.7-1.65-1.33 -1.45   #3. Hypokalemia Replete and recheck  #4. Hypertension- blood pressure improved    #5. Anemia, check hemoglobin level in the morning, likely rehydration related, no active bleeding noted.  PT recommends HHPT  Management plans discussed with the patient, family and they are in agreement.   DRUG ALLERGIES:  Allergies  Allergen Reactions  . Penicillin G Hives  . Penicillins Hives    Has patient had a PCN reaction causing immediate rash, facial/tongue/throat swelling, SOB or lightheadedness with hypotension: No Has patient had a PCN reaction causing severe rash involving mucus membranes or skin necrosis: No Has patient had a PCN reaction that required hospitalization No Has patient had a PCN reaction occurring within the last 10 years: No If all of the above answers are "NO", then may proceed with Cephalosporin use.    CODE STATUS:     Code Status Orders        Start     Ordered   03/06/17 2034  Full code  Continuous     03/06/17 2033    Code Status History    Date Active Date Inactive Code Status Order ID Comments User Context   06/08/2016 11:37 AM 06/10/2016  3:53 PM Full Code [id number]  Fritzi Mandes, MD Inpatient    Advance Directive Documentation     Most Recent Value  Type of Advance Directive  Healthcare Power of [place], Living will  Pre-existing out of facility DNR order (yellow form or pink MOST form)  -  "MOST" Form in Place?  -      TOTAL TIME TAKING CARE OF THIS PATIENT: 35 minutes.    Nicholes Mango M.D on 03/11/2017 at 4:47  PM  Between 7am to 6pm - Pager - [phone]  After 6pm go to [email or web address] - password [email or web address] [facility]  Office  [phone]  CC: Primary care physician; Adline Potter, MD

## 2017-03-11 NOTE — Care Management Important Message (Signed)
Important Message  Patient Details  Name: Kimberly Abbott MRN: [id number] Date of Birth: 1921/01/02   Medicare Important Message Given:  Yes    Beverly Sessions, RN 03/11/2017, 2:06 PM

## 2017-03-11 NOTE — Progress Notes (Addendum)
PHARMACY - ADULT TOTAL PARENTERAL NUTRITION CONSULT NOTE   Pharmacy Consult for TPN ordering and monitoring  Indication: Severe malnutrition in context of chronic illness; prolonged Ileus   Patient Measurements:  Height: 4\' 11"  (149.9 cm) Weight: 114 lb 6.4 oz (51.9 kg) IBW/kg (Calculated) : 43.2 TPN AdjBW (KG): 51.3 Body mass index is 23.11 kg/m.   Assessment: 83 yo female with prolonged Ileus (SBO vs Ileus)   GI: LBM: 8/10 Endo: BGs: stable  Insulin requirements in the past 24 hours: 7 units Lytes:  K: 3.3,  Mg: 2.0,  Phos: 3.4 Renal:Scr: 1.45,  CrCl: 16.2  Best Practices: TPN Access:8/11 TPN start date:8/11  Nutritional Goals (per RD recommendation on 8/12):  KCal: 1200-1500kcal/day Protein: 67-77 g/day Fluid: >1.5L/day  Current Nutrition: NS 69ml/hr, TPN (Clinimix E 5/15) at 40 ml/hr.  Plan:  Recommend discontinue IVFs.   Ordered KCl 52meq IV this morning. Continue to monitor and supplement K, Mg, and P as needed  TPN- Clinimix 5%/15% with electrolytes (add MVI and trace elements)- advance to goal rate @65ml /hr.  Add 20% lipids at 40ml/hr x 12 hrs. Regimen provides 1468kcal/day, 78g/day protein, and 1533ml volume  Every 4 hours SSI ordered and adjust as needed  Monitor TPN labs daily   8/13 0401 K 4.1, Ca 9.6, albumin 3.5, Mg 2.1, phos 1.3. Give sodium phosphate 15 mmol IV x 1. Will recheck electrolytes tomorrow with AM labs.  8/13 13:10 spoke with RD - continue same TPN and ILE formula/volume/rate - reassess tomorrow.  Laural Benes, [facility] Clinical Pharmacist 03/11/2017 7:27 AM

## 2017-03-11 NOTE — Progress Notes (Signed)
PT Cancellation Note  Patient Details Name: Birgitta Uhlir MRN: [id number] DOB: 05/20/1921   Cancelled Treatment:    Reason Eval/Treat Not Completed: Medical issues which prohibited therapy. Treatment attempted. Pt sick today with several bouts of emesis and actively so while therapist in room. Defer PT and re attempt tomorrow.    Larae Grooms, PTA 03/11/2017, 2:53 PM

## 2017-03-11 NOTE — Progress Notes (Addendum)
SURGICAL PROGRESS NOTE (cpt [phone])  Hospital Day(s): 5.   Post op day(s):  Marland Kitchen   Interval History: Patient seen and examined, no acute events or new complaints overnight. Patient reports having passed BM x 2 yesterday and a small amount of flatus overnight with much improved upper abdominal pain, but does not recall having passed any flatus over the past several hours since early this morning, denies N/V, fever/chills, CP, or SOB. She also states she's been drinking very little.  Review of Systems:  Constitutional: denies fever, chills  HEENT: denies cough or congestion  Respiratory: denies any shortness of breath  Cardiovascular: denies chest pain or palpitations  Gastrointestinal: abdominal pain, N/V, and bowel function as per interval history Genitourinary: denies burning with urination or urinary frequency Musculoskeletal: denies pain, decreased motor or sensation Integumentary: denies any other rashes or skin discolorations Neurological: denies HA or vision/hearing changes   Vital signs in last 24 hours: [min-max] current  Temp:  [97.5 F (36.4 C)-99.5 F (37.5 C)] 99.5 F (37.5 C) (08/13 0430) Pulse Rate:  [46-116] 116 (08/13 0430) Resp:  [18-21] 18 (08/13 0430) BP: (122-181)/(57-90) 159/90 (08/13 0430) SpO2:  [93 %-99 %] 93 % (08/13 0430) Weight:  [113 lb 11.2 oz (51.6 kg)-114 lb 6.4 oz (51.9 kg)] 114 lb 6.4 oz (51.9 kg) (08/13 0500)     Height: 4\' 11"  (149.9 cm) Weight: 114 lb 6.4 oz (51.9 kg) BMI (Calculated): 22.9   Intake/Output this shift:  No intake/output data recorded.   Intake/Output last 2 shifts:  @IOLAST2SHIFTS @   Physical Exam:  Constitutional: alert, cooperative and no distress  HENT: normocephalic without obvious abnormality  Eyes: PERRL, EOM's grossly intact and symmetric  Neuro: CN II - XII grossly intact and symmetric without deficit  Respiratory: breathing non-labored at rest  Cardiovascular: regular rate and sinus rhythm  Gastrointestinal: soft  and doesn't appear as distended on exam as abdominal x-ray suggests, mild LUQ > epigastric abdominal tenderness to palpation, no guarding or rebound tenderness Musculoskeletal: UE and LE FROM, no edema or wounds, motor and sensation grossly intact, NT   Labs:  CBC Latest Ref Rng & Units 03/11/2017 03/10/2017 03/09/2017  WBC 3.6 - 11.0 K/uL 20.3(H) 13.0(H) 10.0  Hemoglobin 12.0 - 16.0 g/dL 13.0 12.0 12.4  Hematocrit 35.0 - 47.0 % 40.2 36.7 38.1  Platelets 150 - 440 K/uL 264 258 264   CMP Latest Ref Rng & Units 03/11/2017 03/10/2017 03/09/2017  Glucose 65 - 99 mg/dL 163(H) 181(H) 78  BUN 6 - 20 mg/dL 39(H) 30(H) 25(H)  Creatinine 0.44 - 1.00 mg/dL 1.12(H) 1.45(H) 1.33(H)  Sodium 135 - 145 mmol/L 144 145 142  Potassium 3.5 - 5.1 mmol/L 4.1 3.3(L) 4.1  Chloride 101 - 111 mmol/L 112(H) 114(H) 111  CO2 22 - 32 mmol/L 25 23 21(L)  Calcium 8.9 - 10.3 mg/dL 9.6 9.4 9.4  Total Protein 6.5 - 8.1 g/dL 6.9 6.7 7.4  Total Bilirubin 0.3 - 1.2 mg/dL 0.3 0.6 0.8  Alkaline Phos 38 - 126 U/L 89 101 112  AST 15 - 41 U/L 25 22 27   ALT 14 - 54 U/L 14 14 16    Imaging studies:  Abdominal X-ray (03/11/2017) 1. Slight decrease in distention of small bowel with multiple dilated loops remaining compatible with resolving small bowel obstruction versus ileus. 2. Increasing left lower lobe airspace disease. While this may represent atelectasis, infection or aspiration is also considered.  Assessment/Plan: (ICD-10's: K6.52) 83 y.o. female with likely resolving small bowel obstruction, attributable to post-surgical (  hysterectomy) adhesions despite reported inability to place NG tube for nasogastric decompression, complicated by pertinent comorbidities including HTN, HLD, CKD, and GERD.   - continue clear liquids for now, TPN  - monitor abdominal exam and bowel function   - favor monitoring clinical progression over daily x-rays   - discontinued narcotic pain medications and anti-emetics  - if pain and/or nausea  recur/worsen, will place NG tube  - will reassess for advancement of diet  - ambulation encouraged  - DVT prophylaxis  All of the above findings and recommendations were discussed with the patient, patient's family, and the medical team, and all of patient's and her family's questions were answered to their expressed satisfaction.  Thank you for the opportunity to participate in this patient's care.  -- Marilynne Drivers Rosana Hoes, MD, [place]: [facility] - Partnering for exceptional care. Office: [phone]

## 2017-03-11 NOTE — Progress Notes (Signed)
Chaplain rounding unit visited with pt. Pt was lying on bed with her daughter and granddaughter at bedside. Pt stated that dc's had not figured out what she is suffering from which she compared to a stone wall standing before her. Pt was pleased that [facility] visited with her and asked for prayers to help cope with the uncertainty she is facing today. Pt was calm and delightful despite of her illness.   03/11/17 1100  Clinical Encounter Type  Visited With Patient and family together  Visit Type Initial;Spiritual support  Referral From Chaplain  Consult/Referral To Chaplain  Spiritual Encounters  Spiritual Needs Prayer

## 2017-03-12 ENCOUNTER — Ambulatory Visit: Payer: Medicare HMO | Admitting: Podiatry

## 2017-03-12 ENCOUNTER — Inpatient Hospital Stay: Payer: Medicare HMO | Admitting: Anesthesiology

## 2017-03-12 ENCOUNTER — Encounter
Admission: EM | Disposition: A | Discharge status: Home Health | Payer: Self-pay | Source: Home / Self Care | Attending: Surgery

## 2017-03-12 ENCOUNTER — Encounter: Payer: Self-pay | Admitting: Anesthesiology

## 2017-03-12 DIAGNOSIS — R1310 Dysphagia, unspecified: Secondary | ICD-10-CM

## 2017-03-12 HISTORY — PX: ESOPHAGOGASTRODUODENOSCOPY (EGD) WITH PROPOFOL: SHX5813

## 2017-03-12 LAB — GLUCOSE, CAPILLARY
GLUCOSE-CAPILLARY: 124 mg/dL — AB (ref 65–99)
GLUCOSE-CAPILLARY: 127 mg/dL — AB (ref 65–99)
GLUCOSE-CAPILLARY: 135 mg/dL — AB (ref 65–99)
GLUCOSE-CAPILLARY: 139 mg/dL — AB (ref 65–99)
Glucose-Capillary: 132 mg/dL — ABNORMAL HIGH (ref 65–99)
Glucose-Capillary: 130 mg/dL — ABNORMAL HIGH (ref 65–99)
Glucose-Capillary: 123 mg/dL — ABNORMAL HIGH (ref 65–99)
Glucose-Capillary: 141 mg/dL — ABNORMAL HIGH (ref 65–99)

## 2017-03-12 LAB — BASIC METABOLIC PANEL
ANION GAP: 7 (ref 5–15)
BUN: 54 mg/dL — ABNORMAL HIGH (ref 6–20)
CALCIUM: 9.4 mg/dL (ref 8.9–10.3)
CO2: 26 mmol/L (ref 22–32)
Chloride: 107 mmol/L (ref 101–111)
Creatinine, Ser: 1.46 mg/dL — ABNORMAL HIGH (ref 0.44–1.00)
GFR calc non Af Amer: 30 mL/min — ABNORMAL LOW (ref >60)
GFR, EST AFRICAN AMERICAN: 34 mL/min — AB (ref >60)
GLUCOSE: 138 mg/dL — AB (ref 65–99)
POTASSIUM: 3.8 mmol/L (ref 3.5–5.1)
SODIUM: 140 mmol/L (ref 135–145)

## 2017-03-12 LAB — MAGNESIUM: MAGNESIUM: 2.1 mg/dL (ref 1.7–2.4)

## 2017-03-12 LAB — PHOSPHORUS: Phosphorus: 2.8 mg/dL (ref 2.5–4.6)

## 2017-03-12 SURGERY — ESOPHAGOGASTRODUODENOSCOPY (EGD) WITH PROPOFOL
Anesthesia: General

## 2017-03-12 MED ORDER — FUROSEMIDE 10 MG/ML IJ SOLN
10.0000 mg | Freq: Once | INTRAMUSCULAR | Status: AC
  Administered 2017-03-12: 10 mg via INTRAVENOUS

## 2017-03-12 MED ORDER — ONDANSETRON HCL 4 MG/2ML IJ SOLN: 4.0000 mg | Freq: Once | INTRAMUSCULAR | Status: DC | PRN

## 2017-03-12 MED ORDER — PROPOFOL 10 MG/ML IV BOLUS: INTRAVENOUS | Status: DC | PRN

## 2017-03-12 MED ORDER — ONDANSETRON HCL 4 MG/2ML IJ SOLN
INTRAMUSCULAR | Status: DC | PRN
  Administered 2017-03-12: 4 mg via INTRAVENOUS

## 2017-03-12 MED ORDER — PROPOFOL 500 MG/50ML IV EMUL
INTRAVENOUS | Status: AC
  Filled 2017-03-12: qty 50

## 2017-03-12 MED ORDER — TRACE MINERALS CR-CU-MN-SE-ZN 10-1000-500-60 MCG/ML IV SOLN
INTRAVENOUS | Status: AC
  Administered 2017-03-12: 18:00:00 via INTRAVENOUS
  Filled 2017-03-12: qty 1560

## 2017-03-12 MED ORDER — ROCURONIUM BROMIDE 50 MG/5ML IV SOLN
INTRAVENOUS | Status: AC
  Filled 2017-03-12: qty 1

## 2017-03-12 MED ORDER — FENTANYL CITRATE (PF) 100 MCG/2ML IJ SOLN
INTRAMUSCULAR | Status: DC | PRN
  Administered 2017-03-12: 50 ug via INTRAVENOUS

## 2017-03-12 MED ORDER — IPRATROPIUM-ALBUTEROL 0.5-2.5 (3) MG/3ML IN SOLN
3.0000 mL | Freq: Once | RESPIRATORY_TRACT | Status: AC
  Administered 2017-03-12: 3 mL via RESPIRATORY_TRACT

## 2017-03-12 MED ORDER — FAT EMULSION 20 % IV EMUL
250.0000 mL | INTRAVENOUS | Status: AC
  Administered 2017-03-12: 250 mL via INTRAVENOUS
  Filled 2017-03-12: qty 250

## 2017-03-12 MED ORDER — FENTANYL CITRATE (PF) 100 MCG/2ML IJ SOLN: 25.0000 ug | INTRAMUSCULAR | Status: DC | PRN

## 2017-03-12 MED ORDER — SUCCINYLCHOLINE CHLORIDE 20 MG/ML IJ SOLN
INTRAMUSCULAR | Status: DC | PRN
  Administered 2017-03-12: 100 mg via INTRAVENOUS

## 2017-03-12 MED ORDER — LIDOCAINE HCL (PF) 2 % IJ SOLN
INTRAMUSCULAR | Status: AC
  Filled 2017-03-12: qty 2

## 2017-03-12 MED ORDER — SUCCINYLCHOLINE CHLORIDE 20 MG/ML IJ SOLN
INTRAMUSCULAR | Status: AC
  Filled 2017-03-12: qty 1

## 2017-03-12 MED ORDER — SODIUM CHLORIDE 0.9 % IV SOLN
INTRAVENOUS | Status: DC | PRN
  Administered 2017-03-12: 13:00:00 via INTRAVENOUS

## 2017-03-12 MED ORDER — PHENYLEPHRINE HCL 10 MG/ML IJ SOLN
INTRAMUSCULAR | Status: DC | PRN
  Administered 2017-03-12 (×2): 50 ug via INTRAVENOUS

## 2017-03-12 MED ORDER — HEPARIN SODIUM (PORCINE) 5000 UNIT/ML IJ SOLN
5000.0000 [IU] | Freq: Three times a day (TID) | INTRAMUSCULAR | Status: DC
  Administered 2017-03-12 – 2017-03-20 (×22): 5000 [IU] via SUBCUTANEOUS
  Filled 2017-03-12 (×23): qty 1

## 2017-03-12 MED ORDER — IPRATROPIUM-ALBUTEROL 0.5-2.5 (3) MG/3ML IN SOLN: 3.0000 mL | RESPIRATORY_TRACT | Status: DC

## 2017-03-12 MED ORDER — PROPOFOL 10 MG/ML IV BOLUS
INTRAVENOUS | Status: DC | PRN
  Administered 2017-03-12: 60 mg via INTRAVENOUS

## 2017-03-12 MED ORDER — FENTANYL CITRATE (PF) 100 MCG/2ML IJ SOLN
INTRAMUSCULAR | Status: AC
  Filled 2017-03-12: qty 2

## 2017-03-12 MED ORDER — LIDOCAINE HCL (CARDIAC) 20 MG/ML IV SOLN
INTRAVENOUS | Status: DC | PRN
  Administered 2017-03-12: 30 mg via INTRAVENOUS

## 2017-03-12 MED ORDER — ONDANSETRON HCL 4 MG/2ML IJ SOLN
INTRAMUSCULAR | Status: AC
  Filled 2017-03-12: qty 2

## 2017-03-12 NOTE — Transfer of Care (Signed)
Immediate Anesthesia Transfer of Care Note  Patient: [name]  Procedure(s) Performed: Procedure(s): ESOPHAGOGASTRODUODENOSCOPY (EGD) WITH PROPOFOL (N/A)  Patient Location: PACU  Anesthesia Type:General  Level of Consciousness: awake and sedated  Airway & Oxygen Therapy: Patient Spontanous Breathing and Patient connected to nasal cannula oxygen  Post-op Assessment: Report given to RN and Post -op Vital signs reviewed and stable  Post vital signs: Reviewed and stable  Last Vitals:  Vitals:   03/12/17 0407 03/12/17 1115  BP: 130/63 135/65  Pulse: (!) 109 (!) 102  Resp: 18 (!) 22  Temp: 36.8 C 37.3 C  SpO2: 92% 95%    Last Pain:  Vitals:   03/12/17 1115  TempSrc: Tympanic  PainSc: 0-No pain         Complications: No apparent anesthesia complications

## 2017-03-12 NOTE — Progress Notes (Signed)
SURGICAL PROGRESS NOTE (cpt [phone])  Hospital Day(s): 6.   Post op day(s):  Marland Kitchen   Interval History: Patient seen and examined this morning after several unsuccessful attempts were personally made last night to insert an 12F or 72F NG tube with coiling of the NG tube in the esophagus despite passing the pharynx while swallowing ice water. Following these attempts, patient's family disclosed that patient until the past few years was undergoing regular esophageal dilations for stricture and stopped due to her advanced age. Since discontinuing these esophageal dilations, she has been experiencing progressive difficulty swallowing. Overnight, 2 - 3 additional episodes of emesis, but no other acute events or new complaints otherwise. Patient denies any abdominal pain, fever/chills, CP, or SOB. Patient and her family expressed yesterday afternoon and reiterate today patient's decision that she does not want to consider any surgery under any circumstances, even if life-saving with no other alternatives otherwise, and her family expresses their support regarding her decision.  Review of Systems:  Constitutional: denies fever, chills  HEENT: denies cough or congestion  Respiratory: denies any shortness of breath  Cardiovascular: denies chest pain or palpitations  Gastrointestinal: abdominal pain, N/V, and bowel function as per interval history Genitourinary: denies burning with urination or urinary frequency Musculoskeletal: denies pain, decreased motor or sensation Integumentary: denies any other rashes or skin discolorations Neurological: denies HA or vision/hearing changes  Vital signs in last 24 hours: [min-max] current  Temp:  [97.3 F (36.3 C)-98.2 F (36.8 C)] 98.2 F (36.8 C) (08/14 0407) Pulse Rate:  [94-115] 109 (08/14 0407) Resp:  [18-20] 18 (08/14 0407) BP: (107-130)/(59-70) 130/63 (08/14 0407) SpO2:  [92 %-98 %] 92 % (08/14 0407) Weight:  [114 lb 6.4 oz (51.9 kg)] 114 lb 6.4 oz (51.9  kg) (08/14 0407)     Height: 4\' 11"  (149.9 cm) Weight: 114 lb 6.4 oz (51.9 kg) BMI (Calculated): 22.9   Intake/Output this shift:  Total I/O In: 71 [I.V.:71] Out: 0    Intake/Output last 2 shifts:  @IOLAST2SHIFTS @   Physical Exam:  Constitutional: alert, cooperative and no distress  HENT: normocephalic without obvious abnormality  Eyes: PERRL, EOM's grossly intact and symmetric  Neuro: CN II - XII grossly intact and symmetric without deficit  Respiratory: breathing non-labored at rest  Cardiovascular: regular rate and sinus rhythm  Gastrointestinal: soft, non-tender, and mild abdominal distention unchanged with bilious emesis in bag at bedside  Musculoskeletal: UE and LE FROM, no edema or wounds, motor and sensation grossly intact, NT   Labs:  CBC Latest Ref Rng & Units 03/11/2017 03/10/2017 03/09/2017  WBC 3.6 - 11.0 K/uL 20.3(H) 13.0(H) 10.0  Hemoglobin 12.0 - 16.0 g/dL 13.0 12.0 12.4  Hematocrit 35.0 - 47.0 % 40.2 36.7 38.1  Platelets 150 - 440 K/uL 264 258 264   CMP Latest Ref Rng & Units 03/12/2017 03/11/2017 03/10/2017  Glucose 65 - 99 mg/dL 138(H) 163(H) 181(H)  BUN 6 - 20 mg/dL 54(H) 39(H) 30(H)  Creatinine 0.44 - 1.00 mg/dL 1.46(H) 1.12(H) 1.45(H)  Sodium 135 - 145 mmol/L 140 144 145  Potassium 3.5 - 5.1 mmol/L 3.8 4.1 3.3(L)  Chloride 101 - 111 mmol/L 107 112(H) 114(H)  CO2 22 - 32 mmol/L 26 25 23   Calcium 8.9 - 10.3 mg/dL 9.4 9.6 9.4  Total Protein 6.5 - 8.1 g/dL - 6.9 6.7  Total Bilirubin 0.3 - 1.2 mg/dL - 0.3 0.6  Alkaline Phos 38 - 126 U/L - 89 101  AST 15 - 41 U/L - 25 22  ALT 14 - 54 U/L - 14 14   Imaging studies: No new pertinent imaging studies   Assessment/Plan: (ICD-10's: K31.52) 83 y.o. female with likely resolving small bowel obstruction, attributable to post-surgical (hysterectomy) adhesions despite reported inability to place NG tube for nasogastric decompression, complicated by pertinent comorbidities including HTN, HLD, CKD, and GERD.               - TPN + ice chips for comfort             - monitor abdominal exam and bowel function              - favor monitoring clinical progression over daily x-rays              - discuss with GI (Dr. Allen Norris) and patient endoscopy-guided placement of NG tube +/- dilation of esophageal stricture  - agree with transfer of patient to surgical team as requested by medical team, who agree to continue managing comorbidities             - DVT prophylaxis, ambulation encouraged upon resolution of acute challenges  All of the above findings and recommendations were discussed with the patient, patient's family, and the medical team, and all of patient's and her family's questions were answered to their expressed satisfaction.  Thank you for the opportunity to participate in this patient's care.  -- Marilynne Drivers Rosana Hoes, MD, [place]: [facility] - Partnering for exceptional care. Office: [phone]

## 2017-03-12 NOTE — Anesthesia Procedure Notes (Signed)
Procedure Name: Intubation Performed by: Vaughan Sine Pre-anesthesia Checklist: Patient identified, Emergency Drugs available, Suction available, Patient being monitored and Timeout performed Oxygen Delivery Method: Circle system utilized Preoxygenation: Pre-oxygenation with 100% oxygen Induction Type: IV induction Laryngoscope Size: Miller and 3 Grade View: Grade II Tube type: Oral Tube size: 6.0 mm Number of attempts: 1 Airway Equipment and Method: Stylet and Bite block Placement Confirmation: CO2 detector,  positive ETCO2,  breath sounds checked- equal and bilateral and ETT inserted through vocal cords under direct vision Secured at: 18 cm Tube secured with: Tape Dental Injury: Teeth and Oropharynx as per pre-operative assessment

## 2017-03-12 NOTE — Progress Notes (Signed)
Nutrition Follow Up note   DOCUMENTATION CODES:   Severe malnutrition in context of chronic illness  INTERVENTION:   TPN- Continue Clinimix 5%/15% with electrolytes (add MVI and trace elements)- at goal rate _0 /hr  Add 20% lipids at 60m/hr x 12 hrs  Regimen provides 1468kcal/day, 78g/day protein, and 15660mvolume  Continue to monitor and supplement K, Mg, and P as needed  Monitor daily weights   NUTRITION DIAGNOSIS:   Malnutrition (severe) related to poor appetite (advanced age) as evidenced by severe depletion of muscle mass, severe depletion of body fat.  GOAL:   Patient will meet greater than or equal to 90% of their needs  MONITOR:   Diet advancement, Labs, Weight trends, I & O's, Other (Comment) (TPN)  ASSESSMENT:   [age]ear old female with past medical history significant for history of hypertension, hyperlipidemia, CAD, gastroesophageal reflux disease, multiple abdominal surgeries, including appendectomy, rectal fistula surgery, tubal ligation, who presents to the hospital with complaints of nausea, vomiting, abdominal pain. CAT scan in the emergency room revealed partial small bowel obstruction.   Met with pt in room today. Pt reports that she feels horrible. Pt reports abdominal pain and nausea. Pt drank some broth this morning and then vomited it back up. Per chart, pt is weight stable since admit. Recommend continue TPN at goal rate. Pt made NPO again today. GI consult pending.     Medications reviewed and include: heparin, insulin, protonix  Labs reviewed: K 3.8 wnl, BUN 54(H), creat 1.46(H), P 2.8, Mg 2.1 wnl Triglycerides- 97- 8/13 Wbc- 20.3(H) cbgs- 181, 163, 138 x 48 hrs  Diet Order:  .TPN (CLINIMIX-E) Adult Diet NPO time specified .TPN (CLINIMIX-E) Adult  Skin:  Reviewed, no issues  Last BM:  8/12-type 6  Height:   Ht Readings from Last 1 Encounters:  03/06/17 _1  (1.499 m)    Weight:   Wt Readings from Last 1 Encounters:  03/12/17  114 lb 6.4 oz (51.9 kg)    Ideal Body Weight:  44.5 kg  BMI:  Body mass index is 23.11 kg/m.  Estimated Nutritional Needs:   Kcal:  1200-1500kcal/day   Protein:  67-77g/day   Fluid:  >1.5L/day   EDUCATION NEEDS:   No education needs identified at this time  CaKoleen DistanceS, RD, LDBridgevilleager #- [phone] Hours Pager: [phone]

## 2017-03-12 NOTE — Anesthesia Preprocedure Evaluation (Addendum)
Anesthesia Evaluation  Patient identified by MRN, date of birth, ID band Patient awake    Reviewed: Allergy & Precautions, NPO status , Patient's Chart, lab work & pertinent test results  Airway Mallampati: II  TM Distance: <3 FB     Dental  (+) Partial Upper, Partial Lower   Pulmonary shortness of breath and with exertion, former smoker,   Crackles in the bases     rales    Cardiovascular hypertension, Normal cardiovascular exam     Neuro/Psych PSYCHIATRIC DISORDERS Depression  Neuromuscular disease    GI/Hepatic hiatal hernia, GERD  ,  Endo/Other    Renal/GU Renal InsufficiencyRenal disease  negative genitourinary   Musculoskeletal  (+) Arthritis , Osteoarthritis,    Abdominal Normal abdominal exam  (+)   Peds negative pediatric ROS (+)  Hematology  (+) anemia ,   Anesthesia Other Findings Past Medical History: No date: Allergy No date: CKD (chronic kidney disease) No date: GERD (gastroesophageal reflux disease) No date: Hyperlipidemia No date: Hypertension  Small bowel obstruction  Reproductive/Obstetrics                         Anesthesia Physical Anesthesia Plan  ASA: III  Anesthesia Plan: General   Post-op Pain Management:    Induction: Intravenous, Rapid sequence and Cricoid pressure planned  PONV Risk Score and Plan:   Airway Management Planned: Oral ETT  Additional Equipment:   Intra-op Plan:   Post-operative Plan: Extubation in OR  Informed Consent: I have reviewed the patients History and Physical, chart, labs and discussed the procedure including the risks, benefits and alternatives for the proposed anesthesia with the patient or authorized representative who has indicated his/her understanding and acceptance.   Dental advisory given  Plan Discussed with: CRNA and Surgeon  Anesthesia Plan Comments:        Anesthesia Quick Evaluation

## 2017-03-12 NOTE — Progress Notes (Addendum)
PHARMACY - ADULT TOTAL PARENTERAL NUTRITION CONSULT NOTE   Pharmacy Consult for TPN ordering and monitoring  Indication: Severe malnutrition in context of chronic illness; prolonged Ileus   Patient Measurements:  Height: 4\' 11"  (149.9 cm) Weight: 114 lb 6.4 oz (51.9 kg) IBW/kg (Calculated) : 43.2 TPN AdjBW (KG): 51.3 Body mass index is 23.11 kg/m.   Assessment: 83 yo female with prolonged Ileus (SBO vs Ileus)   GI: LBM: 8/10 Endo: BGs: stable  Insulin requirements in the past 24 hours: 7 units Lytes:  K: 3.3,  Mg: 2.0,  Phos: 3.4 Renal:Scr: 1.45,  CrCl: 16.2  Best Practices: TPN Access:8/11 TPN start date:8/11  Nutritional Goals (per RD recommendation on 8/12):  KCal: 1200-1500kcal/day Protein: 67-77 g/day Fluid: >1.5L/day  Current Nutrition: NS 48ml/hr, TPN (Clinimix E 5/15) at 40 ml/hr.  Plan:  Recommend discontinue IVFs.   Ordered KCl 20meq IV this morning. Continue to monitor and supplement K, Mg, and P as needed  TPN- Clinimix 5%/15% with electrolytes (add MVI and trace elements)- advance to goal rate @65ml /hr.  Add 20% lipids at 10ml/hr x 12 hrs. Regimen provides 1468kcal/day, 78g/day protein, and 156ml volume  Every 4 hours SSI ordered and adjust as needed  Monitor TPN labs daily   8/13 0401 K 4.1, Ca 9.6, albumin 3.5, Mg 2.1, phos 1.3. Give sodium phosphate 15 mmol IV x 1. Will recheck electrolytes tomorrow with AM labs.  8/13 13:10 spoke with RD - continue same TPN and ILE formula/volume/rate - reassess tomorrow.  8/14 0317 K 3.8, Ca 9.4, Mg 2.1, Phos 2.8. No further supplementation at this time. Will recheck electrolytes Thursday with AM labs.  8/14 1029 RD says to continue TPN.   Laural Benes, Pharm.D., BCPS Clinical Pharmacist 03/12/2017 7:13 AM

## 2017-03-12 NOTE — OR Nursing (Signed)
SIZE 18FRENCH NASOGASTRIC  TUBE INSERTED BY DR Allen Norris AFTER  1475CC GREEN EXUDATE DRAINAGED FROM STOMACH DURING ENDOSCOPY. PT TOLERATED WELL. PLACEMENT VERIFIED BY ENDOSCOPE

## 2017-03-12 NOTE — Op Note (Signed)
[facility] Patient Name: Kimberly Abbott Procedure Date: 03/12/2017 12:25 PM MRN: [id number] Account #: [id number] Date of Birth: 1920-09-05 Admit Type: Inpatient Age: 83 Room: [facility] ENDO ROOM 4 Gender: Female Note Status: Finalized Procedure:            Upper GI endoscopy Indications:          Dysphagia Providers:            Lucilla Lame MD, MD Referring MD:         [name]. Plonk, MD (Referring MD) Medicines:            General Anesthesia Complications:        No immediate complications. Procedure:            Pre-Anesthesia Assessment:                       - Prior to the procedure, a History and Physical was                        performed, and patient medications and allergies were                        reviewed. The patient's tolerance of previous                        anesthesia was also reviewed. The risks and benefits of                        the procedure and the sedation options and risks were                        discussed with the patient. All questions were                        answered, and informed consent was obtained. Prior                        Anticoagulants: The patient has taken no previous                        anticoagulant or antiplatelet agents. ASA Grade                        Assessment: II - A patient with mild systemic disease.                        After reviewing the risks and benefits, the patient was                        deemed in satisfactory condition to undergo the                        procedure.                       After obtaining informed consent, the endoscope was                        passed under direct vision. Throughout the procedure,  the patient's blood pressure, pulse, and oxygen                        saturations were monitored continuously. The Endoscope                        was introduced through the mouth, and advanced to the                        second  part of duodenum. The upper GI endoscopy was                        accomplished without difficulty. The patient tolerated                        the procedure well. Findings:      The examined esophagus was normal.      Excessive fluid was found in the entire examined stomach.      The examined duodenum was normal.      An NG tube was placed into the stomach. Impression:           - Normal esophagus.                       - Excessive gastric fluid.                       - Normal examined duodenum.                       - An NG tube was placed into the stomach.                       - No specimens collected. Recommendation:       - Return patient to hospital ward for ongoing care. Procedure Code(s):    --- Professional ---                       [phone], Esophagogastroduodenoscopy, flexible, transoral;                        diagnostic, including collection of specimen(s) by                        brushing or washing, when performed (separate procedure) Diagnosis Code(s):    --- Professional ---                       R13.10, Dysphagia, unspecified CPT copyright 2016 [employer] Medical Association. All rights reserved. The codes documented in this report are preliminary and upon coder review may  be revised to meet current compliance requirements. Lucilla Lame MD, MD 03/12/2017 1:06:29 PM This report has been signed electronically. Number of Addenda: 0 Note Initiated On: 03/12/2017 12:25 PM      [facility]

## 2017-03-12 NOTE — Anesthesia Postprocedure Evaluation (Signed)
Anesthesia Post Note  Patient: Kimberly Abbott  Procedure(s) Performed: Procedure(s) (LRB): ESOPHAGOGASTRODUODENOSCOPY (EGD) WITH PROPOFOL (N/A)  Patient location during evaluation: PACU Anesthesia Type: General Level of consciousness: awake and alert and oriented Pain management: pain level controlled Vital Signs Assessment: post-procedure vital signs reviewed and stable Respiratory status: spontaneous breathing Cardiovascular status: blood pressure returned to baseline Anesthetic complications: no     Last Vitals:  Vitals:   03/12/17 1508 03/12/17 1536  BP: 123/68 135/66  Pulse: (!) 125 (!) 121  Resp: (!) 23 20  Temp:  37.4 C  SpO2: 96%     Last Pain:  Vitals:   03/12/17 1536  TempSrc: Axillary  PainSc:                  [name]

## 2017-03-12 NOTE — Progress Notes (Signed)
PT Cancellation Note  Patient Details Name: Kimberly Abbott MRN: [id number] DOB: 1920-08-23   Cancelled Treatment:    Reason Eval/Treat Not Completed: Medical issues which prohibited therapy. Treatment attempted; pt continues to report illness/emesis and refuses participation with PT. Re attempt at a later time/date, as the schedule allows.    Larae Grooms, PTA 03/12/2017, 9:36 AM

## 2017-03-12 NOTE — Progress Notes (Signed)
[facility] [place] NAME: Kimberly Abbott    MR#:  [id number]  DATE OF BIRTH:  [birth year]  SUBJECTIVE:  CHIEF COMPLAINT:   Chief Complaint  Patient presents with  . Abdominal Pain  . Emesis  The patient is 83 year old female with past medical history significant for history of hypertension, hyperlipidemia, CAD, gastroesophageal reflux disease, multiple abdominal surgeries, including appendectomy, rectal fistula surgery, tubal ligation, who presents to the hospital with complaints of nausea, vomiting, abdominal pain. CAT scan in the emergency room revealed partial small bowel obstruction. Patient continues to have abdominal distention, nausea, but no vomiting, she is passing some gas per rectum, but no stool. She was seen by surgeon, who recommended NG tube placement and suctioning,   PatientIs admitted to the hospitalist service but changed to general surgery service today 03/12/17 for persistent ileus/SBO   Patient is still reporting abdominal discomfort, had EGD and NG tube placement today for chronic esophageal  stricture   Review of Systems  Constitutional: Negative for chills, fever and weight loss.  HENT: Negative for congestion.   Eyes: Negative for blurred vision and double vision.  Respiratory: Negative for cough, sputum production, shortness of breath and wheezing.   Cardiovascular: Negative for chest pain, palpitations, orthopnea, leg swelling and PND.  Gastrointestinal: Positive for abdominal pain, constipation and nausea. Negative for blood in stool, diarrhea and vomiting.  Genitourinary: Negative for dysuria, frequency, hematuria and urgency.  Musculoskeletal: Negative for falls.  Neurological: Negative for dizziness, tremors, focal weakness and headaches.  Endo/Heme/Allergies: Does not bruise/bleed easily.  Psychiatric/Behavioral: Negative for depression. The patient does not have insomnia.     VITAL SIGNS: Blood  pressure 135/66, pulse (!) 121, temperature 99.3 F (37.4 C), temperature source Axillary, resp. rate 20, height 4\' 11"  (1.499 m), weight 51.9 kg (114 lb 6.4 oz), SpO2 96 %.  PHYSICAL EXAMINATION:   GENERAL:  83 y.o.-year-old patient lying in the bed with no acute distress.  EYES: Pupils equal, round, reactive to light and accommodation. No scleral icterus. Extraocular muscles intact.  HEENT: Head atraumatic, normocephalic. NG tube NECK:  Supple, no jugular venous distention. No thyroid enlargement, no tenderness.  LUNGS: Normal breath sounds bilaterally, no wheezing, rales,rhonchi or crepitation. No use of accessory muscles of respiration.  CARDIOVASCULAR: S1, S2 normal. No murmurs, rubs, or gallops.  ABDOMEN: Soft, hypoactive minimal bowel sounds and some abdominal distention,  no guarding  EXTREMITIES: No pedal edema, cyanosis, or clubbing.  NEUROLOGIC: Cranial nerves II through XII are intact. Muscle strength  at has baseline Sensation intact. Gait not checked.  PSYCHIATRIC: The patient is alert and oriented x 3.  SKIN: No obvious rash, lesion, or ulcer.   ORDERS/RESULTS REVIEWED:   CBC  Recent Labs Lab 03/06/17 1103 03/09/17 0354 03/10/17 0332 03/11/17 0401  WBC 10.9 10.0 13.0* 20.3*  HGB 11.8* 12.4 12.0 13.0  HCT 35.0 38.1 36.7 40.2  PLT 268 264 258 264  MCV 87.2 88.6 87.3 87.6  MCH 29.5 28.8 28.5 28.4  MCHC 33.8 32.5 32.7 32.4  RDW 16.2* 16.3* 15.9* 16.2*  LYMPHSABS 0.5*  --   --   --   MONOABS 0.3  --   --   --   EOSABS 0.0  --   --   --   BASOSABS 0.1  --   --   --    ------------------------------------------------------------------------------------------------------------------  Chemistries   Recent Labs Lab 03/06/17 1103  03/08/17 0418 03/09/17 0354 03/09/17 1516 03/10/17 4132  03/11/17 0401 03/12/17 0317  NA 135  < > 143 142  --  145 144 140  K 5.2*  < > 4.4 4.1  --  3.3* 4.1 3.8  CL 103  < > 108 111  --  114* 112* 107  CO2 21*  < > 25 21*  --  23  25 26   GLUCOSE 112*  < > 104* 78  --  181* 163* 138*  BUN 35*  < > 26* 25*  --  30* 39* 54*  CREATININE 2.15*  < > 1.65* 1.33*  --  1.45* 1.12* 1.46*  CALCIUM 10.0  < > 9.5 9.4  --  9.4 9.6 9.4  MG  --   --   --   --  2.0 2.0 2.1 2.1  AST 24  --   --  27  --  22 25  --   ALT 16  --   --  16  --  14 14  --   ALKPHOS 131*  --   --  112  --  101 89  --   BILITOT 0.6  --   --  0.8  --  0.6 0.3  --   < > = values in this interval not displayed. ------------------------------------------------------------------------------------------------------------------ estimated creatinine clearance is 17.4 mL/min (A) (by C-G formula based on SCr of 1.46 mg/dL (H)). ------------------------------------------------------------------------------------------------------------------ No results for input(s): TSH, T4TOTAL, T3FREE, THYROIDAB in the last 72 hours.  Invalid input(s): FREET3  Cardiac Enzymes No results for input(s): CKMB, TROPONINI, MYOGLOBIN in the last 168 hours.  Invalid input(s): CK ------------------------------------------------------------------------------------------------------------------ Invalid input(s): POCBNP ---------------------------------------------------------------------------------------------------------------  RADIOLOGY: Dg Abd Portable 2v  Result Date: 03/11/2017 CLINICAL DATA:  Small bowel obstruction. EXAM: PORTABLE ABDOMEN - 2 VIEW COMPARISON:  Abdominal radiographs 03/10/2017. FINDINGS: The heart is enlarged. Increasing airspace disease is present at the left base. Multiple gas-filled loops of small bowel are again seen with slight decrease in distention. There is no free air. Gas and contrast are noted within the distal colon. There is persistent distention of the stomach. IMPRESSION: 1. Slight decrease in distention of small bowel with multiple dilated loops remaining compatible with resolving small bowel obstruction versus ileus. 2. Increasing left lower lobe  airspace disease. While this may represent atelectasis, infection or aspiration is also considered. Electronically Signed   By: San Morelle M.D.   On: 03/11/2017 07:12    EKG:  Orders placed or performed during the hospital encounter of 09/05/16  . ED EKG  . ED EKG    ASSESSMENT AND PLAN:  Active Problems:   Partial small bowel obstruction (HCC)   Ileus (HCC)   Protein-calorie malnutrition, severe   Problems with swallowing and mastication   #  Acute renal injury, known CAD stage III, improved on IV fluid administration Clinically improving Monitor renal function and avoid nephrotoxins Creatinine 1.7-1.65-1.33 -1.45 --1.46  #. Hypokalemia Replete and recheck PRN  #. Hypertension- blood pressure improved    #. Anemia, check hemoglobin level in the morning, likely rehydration related, no active bleeding noted.  #  small bowel obstruction    Management per surgery  Status post EGD and NG tube placement today  Currently on TPN , Discussed with Dr. Rosana Hoes general surgery  ambulate with assistance as tolerated  PT recommends HHPT  Patient is transferred to surgery service today hospitalist will continue as a consultant for medical assistance thank you Dr. Rosana Hoes for accepting the physician to your service  Management plans discussed with the patient, family  and they are in agreement.   DRUG ALLERGIES:  Allergies  Allergen Reactions  . Penicillin G Hives  . Penicillins Hives    Has patient had a PCN reaction causing immediate rash, facial/tongue/throat swelling, SOB or lightheadedness with hypotension: No Has patient had a PCN reaction causing severe rash involving mucus membranes or skin necrosis: No Has patient had a PCN reaction that required hospitalization No Has patient had a PCN reaction occurring within the last 10 years: No If all of the above answers are "NO", then may proceed with Cephalosporin use.    CODE STATUS:     Code Status Orders          Start     Ordered   03/06/17 2034  Full code  Continuous     03/06/17 2033    Code Status History    Date Active Date Inactive Code Status Order ID Comments User Context   06/08/2016 11:37 AM 06/10/2016  3:53 PM Full Code [id number]  Fritzi Mandes, MD Inpatient    Advance Directive Documentation     Most Recent Value  Type of Advance Directive  Healthcare Power of [place], Living will  Pre-existing out of facility DNR order (yellow form or pink MOST form)  -  "MOST" Form in Place?  -      TOTAL TIME TAKING CARE OF THIS PATIENT: 35 minutes.    Nicholes Mango M.D on 03/12/2017 at 3:53 PM  Between 7am to 6pm - Pager - [phone]  After 6pm go to [email or web address] - password [email or web address] [facility]  Office  [phone]  CC: Primary care physician; Adline Potter, MD

## 2017-03-12 NOTE — Progress Notes (Signed)
Per Dr. Margaretmary Eddy okay to discontinue amlodepine as pt is not tolerating oral medications at this time per surgery. Will give PRN metoprolo if needed.

## 2017-03-12 NOTE — Anesthesia Post-op Follow-up Note (Signed)
Anesthesia QCDR form completed.        

## 2017-03-12 NOTE — Plan of Care (Signed)
  Problem: Activity: Goal: Risk for activity intolerance will decrease Outcome: Progressing   

## 2017-03-13 ENCOUNTER — Encounter: Payer: Self-pay | Admitting: Gastroenterology

## 2017-03-13 LAB — GLUCOSE, CAPILLARY
GLUCOSE-CAPILLARY: 125 mg/dL — AB (ref 65–99)
Glucose-Capillary: 128 mg/dL — ABNORMAL HIGH (ref 65–99)
Glucose-Capillary: 127 mg/dL — ABNORMAL HIGH (ref 65–99)
Glucose-Capillary: 125 mg/dL — ABNORMAL HIGH (ref 65–99)
Glucose-Capillary: 134 mg/dL — ABNORMAL HIGH (ref 65–99)

## 2017-03-13 MED ORDER — M.V.I. ADULT IV INJ
INJECTION | INTRAVENOUS | Status: AC
  Administered 2017-03-13: 19:00:00 via INTRAVENOUS
  Filled 2017-03-13: qty 1560

## 2017-03-13 MED ORDER — FAT EMULSION 20 % IV EMUL
250.0000 mL | INTRAVENOUS | Status: AC
  Administered 2017-03-13: 250 mL via INTRAVENOUS
  Filled 2017-03-13: qty 250

## 2017-03-13 NOTE — Progress Notes (Signed)
[facility] at [place] NAME: Kimberly Abbott    MR#:  [id number]  DATE OF BIRTH:  12-23-1920  SUBJECTIVE:  CHIEF COMPLAINT:   Chief Complaint  Patient presents with  . Abdominal Pain  . Emesis  The patient is 83 year old female with past medical history significant for history of hypertension, hyperlipidemia, CAD, gastroesophageal reflux disease, multiple abdominal surgeries, including appendectomy, rectal fistula surgery, tubal ligation, who presents to the hospital with complaints of nausea, vomiting, abdominal pain. CAT scan in the emergency room revealed partial small bowel obstruction. Patient continues to have abdominal distention, nausea, but no vomiting, she is passing some gas per rectum, but no stool. She was seen by surgeon, who recommended NG tube placement and suctioning,   PatientIs admitted to the hospitalist service but changed to general surgery service today 03/12/17 for persistent ileus/SBO   Patient is still reporting abdominal discomfort, had EGD and NG tube placement by GI 03/11/17 . Daughter at bedside   Review of Systems  Constitutional: Negative for chills, fever and weight loss.  HENT: Negative for congestion.   Eyes: Negative for blurred vision and double vision.  Respiratory: Negative for cough, sputum production, shortness of breath and wheezing.   Cardiovascular: Negative for chest pain, palpitations, orthopnea, leg swelling and PND.  Gastrointestinal: Positive for abdominal pain, constipation and nausea. Negative for diarrhea and vomiting.  Genitourinary: Negative for dysuria, frequency, hematuria and urgency.  Musculoskeletal: Negative for falls.  Neurological: Negative for dizziness, tremors, focal weakness and headaches.  Endo/Heme/Allergies: Does not bruise/bleed easily.  Psychiatric/Behavioral: Negative for depression. The patient does not have insomnia.     VITAL SIGNS: Blood pressure 112/63, pulse  (!) 113, temperature (!) 97.4 F (36.3 C), temperature source Oral, resp. rate 20, height 4\' 11"  (1.499 m), weight 49.1 kg (108 lb 3.2 oz), SpO2 97 %.  PHYSICAL EXAMINATION:   GENERAL:  83 y.o.-year-old patient lying in the bed with no acute distress.  EYES: Pupils equal, round, reactive to light and accommodation. No scleral icterus. Extraocular muscles intact.  HEENT: Head atraumatic, normocephalic. NG tube NECK:  Supple, no jugular venous distention. No thyroid enlargement, no tenderness.  LUNGS: Normal breath sounds bilaterally, no wheezing, rales,rhonchi or crepitation. No use of accessory muscles of respiration.  CARDIOVASCULAR: S1, S2 normal. No murmurs, rubs, or gallops.  ABDOMEN:  minimal  Hypoactive bowel sounds and some abdominal distention,  no guarding  EXTREMITIES: No pedal edema, cyanosis, or clubbing.  NEUROLOGIC: Cranial nerves II through XII are intact. Muscle strength  at has baseline Sensation intact. Gait not checked.  PSYCHIATRIC: The patient is alert and oriented x 3.  SKIN: No obvious rash, lesion, or ulcer.   ORDERS/RESULTS REVIEWED:   CBC  Recent Labs Lab 03/09/17 0354 03/10/17 0332 03/11/17 0401  WBC 10.0 13.0* 20.3*  HGB 12.4 12.0 13.0  HCT 38.1 36.7 40.2  PLT 264 258 264  MCV 88.6 87.3 87.6  MCH 28.8 28.5 28.4  MCHC 32.5 32.7 32.4  RDW 16.3* 15.9* 16.2*   ------------------------------------------------------------------------------------------------------------------  Chemistries   Recent Labs Lab 03/08/17 0418 03/09/17 0354 03/09/17 1516 03/10/17 0332 03/11/17 0401 03/12/17 0317  NA 143 142  --  145 144 140  K 4.4 4.1  --  3.3* 4.1 3.8  CL 108 111  --  114* 112* 107  CO2 25 21*  --  23 25 26   GLUCOSE 104* 78  --  181* 163* 138*  BUN 26* 25*  --  30* 39* 54*  CREATININE 1.65* 1.33*  --  1.45* 1.12* 1.46*  CALCIUM 9.5 9.4  --  9.4 9.6 9.4  MG  --   --  2.0 2.0 2.1 2.1  AST  --  27  --  22 25  --   ALT  --  16  --  14 14  --     ALKPHOS  --  112  --  101 89  --   BILITOT  --  0.8  --  0.6 0.3  --    ------------------------------------------------------------------------------------------------------------------ estimated creatinine clearance is 16.1 mL/min (A) (by C-G formula based on SCr of 1.46 mg/dL (H)). ------------------------------------------------------------------------------------------------------------------ No results for input(s): TSH, T4TOTAL, T3FREE, THYROIDAB in the last 72 hours.  Invalid input(s): FREET3  Cardiac Enzymes No results for input(s): CKMB, TROPONINI, MYOGLOBIN in the last 168 hours.  Invalid input(s): CK ------------------------------------------------------------------------------------------------------------------ Invalid input(s): POCBNP ---------------------------------------------------------------------------------------------------------------  RADIOLOGY: No results found.  EKG:  Orders placed or performed during the hospital encounter of 09/05/16  . ED EKG  . ED EKG    ASSESSMENT AND PLAN:  Active Problems:   Partial small bowel obstruction (HCC)   Ileus (HCC)   Protein-calorie malnutrition, severe   Problems with swallowing and mastication   #  Acute renal injury, known CAD stage III, improved on IV fluid administration Clinically improving Monitor renal function and avoid nephrotoxins Creatinine 1.7-1.65-1.33 -1.45 --1.46,check BMP in a.m.  #. Hypokalemia Replete and recheck PRN  #. Hypertension- blood pressure improved    #. Anemia, check hemoglobin level in the morning, no active bleeding noted.  #  small bowel obstruction    Management per surgery  Status post EGD and NG tube placement 03/12/17, Dark biliary liquid greater than 2000 since NG tube placement  Currently on TPN   ambulate with assistance as tolerated  PT recommends HHPT  Patient is transferred to surgery service 03/12/17  hospitalist will continue as a consultant for medical  assistance thank you Dr. Rosana Hoes for accepting the patient  to your service  Management plans discussed with the patient, family and they are in agreement.   DRUG ALLERGIES:  Allergies  Allergen Reactions  . Penicillin G Hives  . Penicillins Hives    Has patient had a PCN reaction causing immediate rash, facial/tongue/throat swelling, SOB or lightheadedness with hypotension: No Has patient had a PCN reaction causing severe rash involving mucus membranes or skin necrosis: No Has patient had a PCN reaction that required hospitalization No Has patient had a PCN reaction occurring within the last 10 years: No If all of the above answers are "NO", then may proceed with Cephalosporin use.    CODE STATUS:     Code Status Orders        Start     Ordered   03/06/17 2034  Full code  Continuous     03/06/17 2033    Code Status History    Date Active Date Inactive Code Status Order ID Comments User Context   06/08/2016 11:37 AM 06/10/2016  3:53 PM Full Code [id number]  Fritzi Mandes, MD Inpatient    Advance Directive Documentation     Most Recent Value  Type of Advance Directive  Healthcare Power of [place], Living will  Pre-existing out of facility DNR order (yellow form or pink MOST form)  -  "MOST" Form in Place?  -      TOTAL TIME TAKING CARE OF THIS PATIENT: 30 minutes.    Nicholes Mango M.D on 03/13/2017 at  12:53 PM  Between 7am to 6pm - Pager - [phone]  After 6pm go to [email or web address] - password [email or web address] [email or web address]  Office  [phone]  CC: Primary care physician; Adline Potter, MD

## 2017-03-13 NOTE — Progress Notes (Signed)
SURGICAL PROGRESS NOTE (cpt [id number])  Hospital Day(s): 7.   Post op day(s): 1 Day Post-Op.   Interval History: Patient seen and examined, no acute events or new complaints overnight. Patient reports she feels "much better" with complete resolution of N/V and any abdominal pain and distention. She otherwise reports a sore throat with hoarse voice associated with NG tube, denies any fever/chills, CP, or SOB.  Review of Systems:  Constitutional: denies fever, chills  HEENT: denies cough or congestion  Respiratory: denies any shortness of breath  Cardiovascular: denies chest pain or palpitations  Gastrointestinal: abdominal pain, N/V, and bowel function as per interval history Genitourinary: denies burning with urination or urinary frequency Musculoskeletal: denies pain, decreased motor or sensation Integumentary: denies any other rashes or skin discolorations Neurological: denies HA or vision/hearing changes   Vital signs in last 24 hours: [min-max] current  Temp:  [97.4 F (36.3 C)-100.1 F (37.8 C)] 97.4 F (36.3 C) (08/15 0353) Pulse Rate:  [98-125] 98 (08/15 0353) Resp:  [20-32] 20 (08/15 0353) BP: (112-178)/(62-81) 112/63 (08/15 0353) SpO2:  [91 %-100 %] 95 % (08/15 0353) Weight:  [108 lb 3.2 oz (49.1 kg)] 108 lb 3.2 oz (49.1 kg) (08/15 0413)     Height: 4\' 11"  (149.9 cm) Weight: 108 lb 3.2 oz (49.1 kg) BMI (Calculated): 22.9   Intake/Output this shift:  Total I/O In: 195 [I.V.:195] Out: 100 [Emesis/NG output:100]   Intake/Output last 2 shifts:  @IOLAST2SHIFTS @   Physical Exam:  Constitutional: alert, cooperative and no distress  HENT: normocephalic without obvious abnormality  Eyes: PERRL, EOM's grossly intact and symmetric  Neuro: CN II - XII grossly intact and symmetric without deficit  Respiratory: breathing non-labored at rest  Cardiovascular: regular rate and sinus rhythm  Gastrointestinal: soft, completely non-tender and non-distended, NG tube secured and  functioning well with air now flowing freely through blue side-port in comparison to yesterday being unable to flush or aspirate blue side port Musculoskeletal: UE and LE FROM, no edema or wounds, motor and sensation grossly intact, NT   Labs:  CBC Latest Ref Rng & Units 03/11/2017 03/10/2017 03/09/2017  WBC 3.6 - 11.0 K/uL 20.3(H) 13.0(H) 10.0  Hemoglobin 12.0 - 16.0 g/dL 13.0 12.0 12.4  Hematocrit 35.0 - 47.0 % 40.2 36.7 38.1  Platelets 150 - 440 K/uL 264 258 264   CMP Latest Ref Rng & Units 03/12/2017 03/11/2017 03/10/2017  Glucose 65 - 99 mg/dL 138(H) 163(H) 181(H)  BUN 6 - 20 mg/dL 54(H) 39(H) 30(H)  Creatinine 0.44 - 1.00 mg/dL 1.46(H) 1.12(H) 1.45(H)  Sodium 135 - 145 mmol/L 140 144 145  Potassium 3.5 - 5.1 mmol/L 3.8 4.1 3.3(L)  Chloride 101 - 111 mmol/L 107 112(H) 114(H)  CO2 22 - 32 mmol/L 26 25 23   Calcium 8.9 - 10.3 mg/dL 9.4 9.6 9.4  Total Protein 6.5 - 8.1 g/dL - 6.9 6.7  Total Bilirubin 0.3 - 1.2 mg/dL - 0.3 0.6  Alkaline Phos 38 - 126 U/L - 89 101  AST 15 - 41 U/L - 25 22  ALT 14 - 54 U/L - 14 14   Imaging studies: No new pertinent imaging studies   Assessment/Plan:(ICD-10's: K56.52) 83 y.o.femalewith likely small bowel obstruction, attributable to post-surgical (hysterectomy) adhesions despite reported inability to place NG tube for nasogastric decompression, complicated by pertinent comorbidities including HTN, HLD, CKD, and GERD.  - TPN + ice chips for comfort - monitor abdominal exam and bowel function - favor monitoring clinical progression over daily x-rays - appreciate endoscopy-assisted insertion  of NG tube for nasogastric decompression             - typically anticipate symptomatic relief within 24 - 48 hours following NGT insertion, followed by "rumbling" the following day and flatus either the same day or the day following the "rumbling" with anticipated length of stay ~3 - 5 days with successful  non-operative management for 8 of 10 patients with small bowel obstruction secondary to adhesions, though patient's course thus far has been atypical, and patient and her family agree that she does not wish to consider any surgery under any circumstances  - will trend/follow-up WBC tomorrow morning and consider CXR if persistent/elevated leukocytosis             - medical management as per medical team             - ambulation encouraged              - DVT prophylaxis  All of the above findings and recommendations were discussed with the patient, patient's family, and the medical team, and all of patient's and herfamily's questions were answered to their expressed satisfaction.  -- Marilynne Drivers Rosana Hoes, MD, [place]: [facility] - Partnering for exceptional care. Office: [phone]

## 2017-03-13 NOTE — Progress Notes (Addendum)
PHARMACY - ADULT TOTAL PARENTERAL NUTRITION CONSULT NOTE   Pharmacy Consult for TPN ordering and monitoring  Indication: Severe malnutrition in context of chronic illness; prolonged Ileus   Patient Measurements:  Height: 4\' 11"  (149.9 cm) Weight: 108 lb 3.2 oz (49.1 kg) IBW/kg (Calculated) : 43.2 TPN AdjBW (KG): 51.3 Body mass index is 21.85 kg/m.   Assessment: 83 yo female with prolonged Ileus (SBO vs Ileus)   GI: LBM: 8/10 Endo: BGs: stable  Insulin requirements in the past 24 hours: 7 units Lytes:  K: 3.3,  Mg: 2.0,  Phos: 3.4 Renal:Scr: 1.45,  CrCl: 16.2  Best Practices: TPN Access:8/11 TPN start date:8/11  Nutritional Goals (per RD recommendation on 8/12):  KCal: 1200-1500kcal/day Protein: 67-77 g/day Fluid: >1.5L/day  Current Nutrition: NS 62ml/hr, TPN (Clinimix E 5/15) at 40 ml/hr.  Plan:  Recommend discontinue IVFs.   Ordered KCl 39meq IV this morning. Continue to monitor and supplement K, Mg, and P as needed  TPN- Clinimix 5%/15% with electrolytes (add MVI and trace elements)- advance to goal rate @65ml /hr.  Add 20% lipids at 36ml/hr x 12 hrs. Regimen provides 1468kcal/day, 78g/day protein, and 1561ml volume  Every 4 hours SSI ordered and adjust as needed  Monitor TPN labs daily   8/13 0401 K 4.1, Ca 9.6, albumin 3.5, Mg 2.1, phos 1.3. Give sodium phosphate 15 mmol IV x 1. Will recheck electrolytes tomorrow with AM labs.  8/13 13:10 spoke with RD - continue same TPN and ILE formula/volume/rate - reassess tomorrow.  8/14 0317 K 3.8, Ca 9.4, Mg 2.1, Phos 2.8. No further supplementation at this time. Will recheck electrolytes Thursday with AM labs.  8/14 1029 RD says to continue TPN.   8/15 1122 TPN to continue. At goal. Will recheck BMP/Mg/Phos tomorrow with AM labs. Albumin, triglycerides, CBC, LFTs will be due on Monday 8/20.  Laural Benes, Pharm.D., BCPS Clinical Pharmacist 03/13/2017 11:22 AM

## 2017-03-13 NOTE — Progress Notes (Signed)
Initial Nutrition Assessment  DOCUMENTATION CODES:   Severe malnutrition in context of chronic illness  INTERVENTION:  1. Recommend continue Clinimix E 5/15 with MVI and trace elements at 32mL/hr 2. Continue 20% lipids at 18mL/hr x12 hrs  Daily weights Monitor and Replete K, Mg, P as needed - WNL yesterday, no new labs today.  NUTRITION DIAGNOSIS:   Malnutrition related to poor appetite (advanced age) as evidenced by severe depletion of muscle mass, severe depletion of body fat. -ongoing  GOAL:   Patient will meet greater than or equal to 90% of their needs -progressing  MONITOR:   Diet advancement, Labs, Weight trends, I & O's, Other (Comment) (TPN)  ASSESSMENT:   83 year old female with past medical history significant for history of hypertension, hyperlipidemia, CAD, gastroesophageal reflux disease, multiple abdominal surgeries, including appendectomy, rectal fistula surgery, tubal ligation, who presents to the hospital with complaints of nausea, vomiting, abdominal pain. CAT scan in the emergency room revealed partial small bowel obstruction.  Underwent upper GI endoscopy yesterday - only found excessive fluid in stomach. NGT placed. Patient feels a lot better, N/V abd pain resolved, mild discomfort. Continues with SBO  Access: L Triple Lumen PICC    Intake/Output Summary (Last 24 hours) at 03/13/17 1349 Last data filed at 03/13/17 1200  Gross per 24 hour  Intake             2201 ml  Output             2795 ml  Net             -594 ml   6.6L Fluid Positive. 1,069mL NGT output, 152mL UOP last 24 Labs reviewed:  CBGs 127, 125, 128 Medications reviewed and include:  Novolog 0-9 Units Q4H,   Diet Order:  .TPN (CLINIMIX-E) Adult Diet NPO time specified .TPN (CLINIMIX-E) Adult  Skin:  Reviewed, no issues  Last BM:  8/12-type 6  Height:   Ht Readings from Last 1 Encounters:  03/06/17 4\' 11"  (1.499 m)    Weight:   Wt Readings from Last 1 Encounters:   03/13/17 108 lb 3.2 oz (49.1 kg)    Ideal Body Weight:  44.5 kg  BMI:  Body mass index is 21.85 kg/m.  Estimated Nutritional Needs:   Kcal:  1200-1500kcal/day   Protein:  67-77g/day   Fluid:  >1.5L/day   EDUCATION NEEDS:   No education needs identified at this time  [name]. Yancey Pedley, MS, RD LDN Inpatient Clinical Dietitian Pager [phone]

## 2017-03-13 NOTE — Progress Notes (Signed)
Physical Therapy Treatment Patient Details Name: Kimberly Abbott MRN: [id number] DOB: 28-Apr-1921 Today's Date: 03/13/2017    History of Present Illness Pt is 83 year old female with past medical history significant for history of hypertension, hyperlipidemia, CAD, gastroesophageal reflux disease, multiple abdominal surgeries, including appendectomy, rectal fistula surgery, tubal ligation, who presents to the hospital with complaints of nausea, vomiting, abdominal pain. CAT scan in the emergency room revealed partial small bowel obstruction. Patient continues to have abdominal distention, nausea, but no vomiting, she is passing some gas per rectum, but no stool.  Assessment includes: Partial small bowel obstruction versus ileus, Acute renal injury, known CAD stage III, hyperkalemia, hyponatremia, and anemia.    PT Comments    Pt initially refuses PT, but with gentle encouragement agreeable to exercise. Pt up in chair upon arrival; denies pain, but notes fatigue. Pt participates well with limited exercise session. Educated on proper techniques and frequency throughout the day as well as progression of repetitions. Continue PT to progress strength/endurance to improve all functional mobility.    Follow Up Recommendations  Home health PT (per progress)     Equipment Recommendations  None recommended by PT    Recommendations for Other Services       Precautions / Restrictions Precautions Precautions: Fall Restrictions Weight Bearing Restrictions: No    Mobility  Bed Mobility               General bed mobility comments: Not tested; up in chair on arrival  Transfers                    Ambulation/Gait                 Stairs            Wheelchair Mobility    Modified Rankin (Stroke Patients Only)       Balance                                            Cognition Arousal/Alertness: Awake/alert Behavior During Therapy: WFL for tasks  assessed/performed Overall Cognitive Status: Within Functional Limits for tasks assessed                                 General Comments: Pt now with NG tube; not verbalizing. Communicates with head nods      Exercises General Exercises - Lower Extremity Ankle Circles/Pumps: AROM;Both;10 reps Quad Sets: Strengthening;Both;10 reps Gluteal Sets: Strengthening;Both;10 reps Short Arc Quad: AROM;Both;10 reps Heel Slides: AROM;AAROM;Both;10 reps (assist with return to extension for control) Hip ABduction/ADduction: AAROM;Both;10 reps    General Comments        Pertinent Vitals/Pain Pain Assessment: No/denies pain    Home Living                      Prior Function            PT Goals (current goals can now be found in the care plan section) Progress towards PT goals: Progressing toward goals    Frequency    Min 2X/week      PT Plan Current plan remains appropriate    Co-evaluation              AM-PAC PT "6 Clicks" Daily Activity  Outcome Measure  Difficulty turning over in  bed (including adjusting bedclothes, sheets and blankets)?: A Little Difficulty moving from lying on back to sitting on the side of the bed? : Total Difficulty sitting down on and standing up from a chair with arms (e.g., wheelchair, bedside commode, etc,.)?: Total Help needed moving to and from a bed to chair (including a wheelchair)?: A Little Help needed walking in hospital room?: A Lot Help needed climbing 3-5 steps with a railing? : A Lot 6 Click Score: 12    End of Session   Activity Tolerance: Patient limited by fatigue Patient left: in chair;with call bell/phone within reach;with chair alarm set;with family/visitor present   PT Visit Diagnosis: Muscle weakness (generalized) (M62.81)     Time: 1423-9532 PT Time Calculation (min) (ACUTE ONLY): 16 min  Charges:  $Therapeutic Exercise: 8-22 mins                    G CodesLarae Grooms,  PTA 03/13/2017, 11:20 AM

## 2017-03-13 NOTE — Progress Notes (Signed)
[facility] made a follow up visit with pt. Pt had apparatus on his nose that made it difficult for her to talk to [facility]. However, [place] talked to pt's daughter who stated that pt was making slow progress since receiving procedure. [place] asked pt's daughter if she had any needs and concerns, she said none. [place] offered spiritual support and a ministry of presence. [place] to follow up with pt as needed to provide spiritual support and care.   03/13/17 1500  Clinical Encounter Type  Visited With Patient and family together  Visit Type Follow-up;Spiritual support  Referral From [place]

## 2017-03-14 LAB — GLUCOSE, CAPILLARY
GLUCOSE-CAPILLARY: 141 mg/dL — AB (ref 65–99)
GLUCOSE-CAPILLARY: 132 mg/dL — AB (ref 65–99)
GLUCOSE-CAPILLARY: 130 mg/dL — AB (ref 65–99)
Glucose-Capillary: 135 mg/dL — ABNORMAL HIGH (ref 65–99)
Glucose-Capillary: 139 mg/dL — ABNORMAL HIGH (ref 65–99)
Glucose-Capillary: 167 mg/dL — ABNORMAL HIGH (ref 65–99)

## 2017-03-14 LAB — CBC
HCT: 34.7 % — ABNORMAL LOW (ref 35.0–47.0)
Hemoglobin: 11.6 g/dL — ABNORMAL LOW (ref 12.0–16.0)
MCH: 29.3 pg (ref 26.0–34.0)
MCHC: 33.5 g/dL (ref 32.0–36.0)
MCV: 87.6 fL (ref 80.0–100.0)
PLATELETS: 208 10*3/uL (ref 150–440)
RBC: 3.96 MIL/uL (ref 3.80–5.20)
RDW: 16.6 % — ABNORMAL HIGH (ref 11.5–14.5)
WBC: 14.3 10*3/uL — ABNORMAL HIGH (ref 3.6–11.0)

## 2017-03-14 LAB — PHOSPHORUS: Phosphorus: 2.7 mg/dL (ref 2.5–4.6)

## 2017-03-14 LAB — BASIC METABOLIC PANEL
Anion gap: 7 (ref 5–15)
BUN: 62 mg/dL — ABNORMAL HIGH (ref 6–20)
CO2: 25 mmol/L (ref 22–32)
CREATININE: 1.24 mg/dL — AB (ref 0.44–1.00)
Calcium: 8.9 mg/dL (ref 8.9–10.3)
Chloride: 110 mmol/L (ref 101–111)
GFR calc Af Amer: 42 mL/min — ABNORMAL LOW (ref >60)
GFR calc non Af Amer: 36 mL/min — ABNORMAL LOW (ref >60)
GLUCOSE: 116 mg/dL — AB (ref 65–99)
Potassium: 4.2 mmol/L (ref 3.5–5.1)
Sodium: 142 mmol/L (ref 135–145)

## 2017-03-14 LAB — MAGNESIUM: Magnesium: 2.3 mg/dL (ref 1.7–2.4)

## 2017-03-14 MED ORDER — TRACE MINERALS CR-CU-MN-SE-ZN 10-1000-500-60 MCG/ML IV SOLN
INTRAVENOUS | Status: AC
  Administered 2017-03-14: 18:00:00 via INTRAVENOUS
  Filled 2017-03-14: qty 1320

## 2017-03-14 MED ORDER — SCOPOLAMINE 1 MG/3DAYS TD PT72
1.0000 | MEDICATED_PATCH | TRANSDERMAL | Status: DC
  Administered 2017-03-14 – 2017-03-17 (×2): 1.5 mg via TRANSDERMAL
  Filled 2017-03-14 (×3): qty 1

## 2017-03-14 MED ORDER — FAT EMULSION 20 % IV EMUL
250.0000 mL | INTRAVENOUS | Status: AC
  Administered 2017-03-14: 250 mL via INTRAVENOUS
  Filled 2017-03-14 (×3): qty 250

## 2017-03-14 NOTE — Progress Notes (Addendum)
PHARMACY - ADULT TOTAL PARENTERAL NUTRITION CONSULT NOTE   Pharmacy Consult for TPN ordering and monitoring  Indication: Severe malnutrition in context of chronic illness; prolonged Ileus   Patient Measurements:  Height: 4\' 11"  (149.9 cm) Weight: 111 lb 11.2 oz (50.7 kg) IBW/kg (Calculated) : 43.2 TPN AdjBW (KG): 51.3 Body mass index is 22.56 kg/m.   Assessment: 83 yo female with prolonged Ileus (SBO vs Ileus)   GI: LBM: 8/10 Endo: BGs: stable  Insulin requirements in the past 24 hours: 7 units Lytes:  K: 3.3,  Mg: 2.0,  Phos: 3.4 Renal:Scr: 1.45,  CrCl: 16.2  Best Practices: TPN Access:8/11 TPN start date:8/11  Nutritional Goals (per RD recommendation on 8/12):  KCal: 1200-1500kcal/day Protein: 67-77 g/day Fluid: >1.5L/day  Current Nutrition: NS 80ml/hr, TPN (Clinimix E 5/15) at 40 ml/hr.  Plan:  Recommend discontinue IVFs.   Ordered KCl 82meq IV this morning. Continue to monitor and supplement K, Mg, and P as needed  TPN- Clinimix 5%/15% with electrolytes (add MVI and trace elements)- advance to goal rate @65ml /hr.  Add 20% lipids at 51ml/hr x 12 hrs. Regimen provides 1468kcal/day, 78g/day protein, and 1579ml volume  Every 4 hours SSI ordered and adjust as needed  Monitor TPN labs daily   8/13 0401 K 4.1, Ca 9.6, albumin 3.5, Mg 2.1, phos 1.3. Give sodium phosphate 15 mmol IV x 1. Will recheck electrolytes tomorrow with AM labs.  8/13 13:10 spoke with RD - continue same TPN and ILE formula/volume/rate - reassess tomorrow.  8/14 0317 K 3.8, Ca 9.4, Mg 2.1, Phos 2.8. No further supplementation at this time. Will recheck electrolytes Thursday with AM labs.  8/14 1029 RD says to continue TPN.   8/15 1122 TPN to continue. At goal. Will recheck BMP/Mg/Phos tomorrow with AM labs. Albumin, triglycerides, CBC, LFTs will be due on Monday 8/20.  8/16 0630 Electrolytes WNL. No further supplement warranted at this point. Will recheck albumin, CBC, BMP, Mg, Phos, LFTs,  and triglycerides on Monday 8/20.   8/16 0941 RD recommends decreasing TPN to 55 mL/hr due to increased BUN (62). BUN/SCr 50, patient had 700 mL output from NG yesterday. No change to the fats.    Laural Benes, Pharm.D., BCPS Clinical Pharmacist 03/14/2017 7:45 AM

## 2017-03-14 NOTE — Progress Notes (Signed)
Physical Therapy Treatment Patient Details Name: Kimberly Abbott MRN: [id number] DOB: 16-Jul-1921 Today's Date: 03/14/2017    History of Present Illness Pt is 83 year old female with past medical history significant for history of hypertension, hyperlipidemia, CAD, gastroesophageal reflux disease, multiple abdominal surgeries, including appendectomy, rectal fistula surgery, tubal ligation, who presents to the hospital with complaints of nausea, vomiting, abdominal pain. CAT scan in the emergency room revealed partial small bowel obstruction. Patient continues to have abdominal distention, nausea, but no vomiting, she is passing some gas per rectum, but no stool.  Assessment includes: Partial small bowel obstruction versus ileus, Acute renal injury, known CAD stage III, hyperkalemia, hyponatremia, and anemia.    PT Comments    Participated in exercises as described below.  Pt generally lethargic but agrees with encouragement.  Pt initially engaged but as she fatigues assistance level needed increased.   Family in room stated she has been ambulating with nursing.  Stated she had recently been up in chair and bathed which caused fatigue.   Follow Up Recommendations  Home health PT     Equipment Recommendations  None recommended by PT    Recommendations for Other Services       Precautions / Restrictions Precautions Precautions: Fall Restrictions Weight Bearing Restrictions: No Other Position/Activity Restrictions: NG    Mobility  Bed Mobility                  Transfers                    Ambulation/Gait                 Stairs            Wheelchair Mobility    Modified Rankin (Stroke Patients Only)       Balance                                            Cognition Arousal/Alertness: Lethargic Behavior During Therapy: WFL for tasks assessed/performed Overall Cognitive Status: Within Functional Limits for tasks assessed                                         Exercises Other Exercises Other Exercises: Supine x 10 for ankle pumps, heel slides, SLR, Ab/add with increasing assist dur to fatigue/lethargy    General Comments        Pertinent Vitals/Pain Pain Assessment: No/denies pain    Home Living                      Prior Function            PT Goals (current goals can now be found in the care plan section) Progress towards PT goals: Progressing toward goals    Frequency    Min 2X/week      PT Plan Current plan remains appropriate    Co-evaluation              AM-PAC PT "6 Clicks" Daily Activity  Outcome Measure  Difficulty turning over in bed (including adjusting bedclothes, sheets and blankets)?: A Little Difficulty moving from lying on back to sitting on the side of the bed? : Total Difficulty sitting down on and standing up from a chair with arms (e.g.,  wheelchair, bedside commode, etc,.)?: Total Help needed moving to and from a bed to chair (including a wheelchair)?: A Little Help needed walking in hospital room?: A Lot Help needed climbing 3-5 steps with a railing? : A Lot 6 Click Score: 12    End of Session   Activity Tolerance: Patient limited by fatigue;Patient limited by lethargy Patient left: in bed;with bed alarm set;with call bell/phone within reach;with family/visitor present         Time: 3009-7949 PT Time Calculation (min) (ACUTE ONLY): 8 min  Charges:  $Therapeutic Exercise: 8-22 mins                    G Codes:       Chesley Noon, PTA 03/14/17, 11:30 AM

## 2017-03-14 NOTE — Progress Notes (Signed)
Initial Nutrition Assessment  DOCUMENTATION CODES:   Severe malnutrition in context of chronic illness  INTERVENTION:  1. Recommend decrease Clinimix 5/15 (no electrolytes) to 55mL/hr, Continue 20% lipids at 28mL/hr x12 Provides 1297 calories, 66 grams protein (1.28g/kg) , 1333mL total volume  NUTRITION DIAGNOSIS:   Malnutrition related to poor appetite (advanced age) as evidenced by severe depletion of muscle mass, severe depletion of body fat. -ongoing  GOAL:   Patient will meet greater than or equal to 90% of their needs -meeting with TPN  MONITOR:   Diet advancement, Labs, Weight trends, I & O's, Other (Comment) (TPN)  ASSESSMENT:   83 year old female with past medical history significant for history of hypertension, hyperlipidemia, CAD, gastroesophageal reflux disease, multiple abdominal surgeries, including appendectomy, rectal fistula surgery, tubal ligation, who presents to the hospital with complaints of nausea, vomiting, abdominal pain. CAT scan in the emergency room revealed partial small bowel obstruction.  Patient resting with NGT in, output 750mL overnight.  189mL UOP yesterday. 6.3L Fluid Positive Patient "wants to go home." Weight down 2lbs  Labs reviewed:  Electrolytes WNL, BUN/Creatinine 62/1.24 (Creatinine trending downwards) Suspect patient may be receiving too much protein via TPN, does not seem dehydrated at this point.  Medications reviewed and include:  Novolog 0-9 Units every 4 hours  Diet Order:  Diet NPO time specified .TPN (CLINIMIX-E) Adult .TPN (CLINIMIX-E) Adult  Skin:  Reviewed, no issues  Last BM:  8/12-type 6  Height:   Ht Readings from Last 1 Encounters:  03/06/17 4\' 11"  (1.499 m)    Weight:   Wt Readings from Last 1 Encounters:  03/14/17 111 lb 11.2 oz (50.7 kg)    Ideal Body Weight:  44.5 kg  BMI:  Body mass index is 22.56 kg/m.  Estimated Nutritional Needs:   Kcal:  1200-1500kcal/day   Protein:  67-77g/day    Fluid:  >1.5L/day   EDUCATION NEEDS:   No education needs identified at this time  Kimberly Abbott. Kimberly Lienau, MS, RD LDN Inpatient Clinical Dietitian Pager [phone]

## 2017-03-14 NOTE — Care Management (Signed)
Followed up with patient's daughter.  She has not yet decided on home health agency

## 2017-03-14 NOTE — Progress Notes (Signed)
Patient is having quite a bit of mucous from her mouth.  Clear, frothy, thick.  Order received from Dr Margaretmary Eddy for scopolamine patch

## 2017-03-14 NOTE — Progress Notes (Signed)
Chaplain made a follow up visit with pt. Pt was calm and pleasantly resting. [place] spoke with pt's daughter and another person who were in the Rm at the time of this visit. Daughter stated pt was calm and resting well. [place] to follow up with pt as needed.    03/14/17 1400  Clinical Encounter Type  Visited With Patient and family together  Visit Type Follow-up;Spiritual support  Referral From [place];Other (Comment)

## 2017-03-14 NOTE — Progress Notes (Signed)
[place] [facility] [place] NAME: Kimberly Abbott    MR#:  [id number]  DATE OF BIRTH:  February 22, 1921  SUBJECTIVE:  CHIEF COMPLAINT:   Chief Complaint  Patient presents with  . Abdominal Pain  . Emesis  The patient is 83 year old female with past medical history significant for history of hypertension, hyperlipidemia, CAD, gastroesophageal reflux disease, multiple abdominal surgeries, including appendectomy, rectal fistula surgery, tubal ligation, who presents to the hospital with complaints of nausea, vomiting, abdominal pain. CAT scan in the emergency room revealed partial small bowel obstruction. Patient continues to have abdominal distention, nausea, but no vomiting, she is passing some gas per rectum, but no stool. She was seen by surgeon, who recommended NG tube placement and suctioning,   PatientIs admitted to the hospitalist service but changed to general surgery service today 03/12/17 for persistent ileus/SBO   Patient is still reporting abdominal discomfort, had EGD and NG tube placement by GI 03/11/17.Patient is feeling okay today and thinks she would feel better if NG tube  is taken out, still with significant amount of NG suction though   Review of Systems  Constitutional: Negative for chills, fever and weight loss.  HENT: Negative for congestion.   Eyes: Negative for blurred vision and double vision.  Respiratory: Negative for cough, sputum production, shortness of breath and wheezing.   Cardiovascular: Negative for chest pain, palpitations, orthopnea, leg swelling and PND.  Gastrointestinal: Positive for abdominal pain and constipation. Negative for diarrhea, nausea and vomiting.  Genitourinary: Negative for dysuria, frequency, hematuria and urgency.  Musculoskeletal: Negative for falls.  Neurological: Negative for dizziness, tremors, focal weakness and headaches.  Endo/Heme/Allergies: Does not bruise/bleed easily.    Psychiatric/Behavioral: Negative for depression. The patient does not have insomnia.     VITAL SIGNS: Blood pressure 138/84, pulse (!) 109, temperature 98.1 F (36.7 C), temperature source Oral, resp. rate 20, height 4\' 11"  (1.499 m), weight 50.7 kg (111 lb 11.2 oz), SpO2 96 %.  PHYSICAL EXAMINATION:   GENERAL:  83 y.o.-year-old patient lying in the bed with no acute distress.  EYES: Pupils equal, round, reactive to light and accommodation. No scleral icterus. Extraocular muscles intact.  HEENT: Head atraumatic, normocephalic. NG tube NECK:  Supple, no jugular venous distention. No thyroid enlargement, no tenderness.  LUNGS: Normal breath sounds bilaterally, no wheezing, rales,rhonchi or crepitation. No use of accessory muscles of respiration.  CARDIOVASCULAR: S1, S2 normal. No murmurs, rubs, or gallops.  ABDOMEN:  minimal  Hypoactive bowel sounds and some abdominal distention, soft, no guarding  EXTREMITIES: No pedal edema, cyanosis, or clubbing.  NEUROLOGIC: Cranial nerves II through XII are intact. Muscle strength  at has baseline Sensation intact. Gait not checked.  PSYCHIATRIC: The patient is alert and oriented x 3.  SKIN: No obvious rash, lesion, or ulcer.   ORDERS/RESULTS REVIEWED:   CBC  Recent Labs Lab 03/09/17 0354 03/10/17 0332 03/11/17 0401 03/14/17 0630  WBC 10.0 13.0* 20.3* 14.3*  HGB 12.4 12.0 13.0 11.6*  HCT 38.1 36.7 40.2 34.7*  PLT 264 258 264 208  MCV 88.6 87.3 87.6 87.6  MCH 28.8 28.5 28.4 29.3  MCHC 32.5 32.7 32.4 33.5  RDW 16.3* 15.9* 16.2* 16.6*   ------------------------------------------------------------------------------------------------------------------  Chemistries   Recent Labs Lab 03/09/17 0354 03/09/17 1516 03/10/17 0332 03/11/17 0401 03/12/17 0317 03/14/17 0630  NA 142  --  145 144 140 142  K 4.1  --  3.3* 4.1 3.8 4.2  CL 111  --  114* 112* 107 110  CO2 21*  --  23 25 26 25   GLUCOSE 78  --  181* 163* 138* 116*  BUN 25*  --   30* 39* 54* 62*  CREATININE 1.33*  --  1.45* 1.12* 1.46* 1.24*  CALCIUM 9.4  --  9.4 9.6 9.4 8.9  MG  --  2.0 2.0 2.1 2.1 2.3  AST 27  --  22 25  --   --   ALT 16  --  14 14  --   --   ALKPHOS 112  --  101 89  --   --   BILITOT 0.8  --  0.6 0.3  --   --    ------------------------------------------------------------------------------------------------------------------ estimated creatinine clearance is 18.9 mL/min (A) (by C-G formula based on SCr of 1.24 mg/dL (H)). ------------------------------------------------------------------------------------------------------------------ No results for input(s): TSH, T4TOTAL, T3FREE, THYROIDAB in the last 72 hours.  Invalid input(s): FREET3  Cardiac Enzymes No results for input(s): CKMB, TROPONINI, MYOGLOBIN in the last 168 hours.  Invalid input(s): CK ------------------------------------------------------------------------------------------------------------------ Invalid input(s): POCBNP ---------------------------------------------------------------------------------------------------------------  RADIOLOGY: No results found.  EKG:  Orders placed or performed during the hospital encounter of 09/05/16  . ED EKG  . ED EKG    ASSESSMENT AND PLAN:  Active Problems:   Partial small bowel obstruction (HCC)   Ileus (HCC)   Protein-calorie malnutrition, severe   Problems with swallowing and mastication   #  Acute renal injury, known CAD stage III, improved on IV fluid administration Monitor renal function and avoid nephrotoxins Creatinine 1.7-1.65-1.33 -1.45 --1.46- 1.24, improved  #. Hypokalemia resolved Replete and recheck PRN  #. Hypertension- blood pressure improved    #. Anemia, check hemoglobin level in the morning, no active bleeding noted.  #  small bowel obstruction    Management per surgery  Clinically better, with some flatus Status post EGD and NG tube placement 03/12/17 Monitor intake and output  Currently on  TPN   ambulate with assistance as tolerated Discussed with Dr. Rosana Hoes, anticipating to discontinue NG tube tomorrow if patient continues to improve clinically  PT recommends HHPT  Patient is transferred to surgery service 03/12/17  hospitalist will continue as a consultant for medical assistance thank you Dr. Rosana Hoes for accepting the patient  to your service  Management plans discussed with the patient, family at bedside and they are in agreement.    DRUG ALLERGIES:  Allergies  Allergen Reactions  . Penicillin G Hives  . Penicillins Hives    Has patient had a PCN reaction causing immediate rash, facial/tongue/throat swelling, SOB or lightheadedness with hypotension: No Has patient had a PCN reaction causing severe rash involving mucus membranes or skin necrosis: No Has patient had a PCN reaction that required hospitalization No Has patient had a PCN reaction occurring within the last 10 years: No If all of the above answers are "NO", then may proceed with Cephalosporin use.    CODE STATUS:     Code Status Orders        Start     Ordered   03/06/17 2034  Full code  Continuous     03/06/17 2033    Code Status History    Date Active Date Inactive Code Status Order ID Comments User Context   06/08/2016 11:37 AM 06/10/2016  3:53 PM Full Code [id number]  Fritzi Mandes, MD Inpatient    Advance Directive Documentation     Most Recent Value  Type of Advance Directive  Healthcare Power of [place], Living will  Pre-existing out of facility DNR order (yellow form or pink MOST form)  -  "MOST" Form in Place?  -      TOTAL TIME TAKING CARE OF THIS PATIENT: 32 minutes.    Nicholes Mango M.D on 03/14/2017 at 9:19 AM  Between 7am to 6pm - Pager - [phone]  After 6pm go to [email or web address] - password [email or web address] [facility]  Office  [phone]  CC: Primary care physician; Adline Potter, MD

## 2017-03-14 NOTE — Progress Notes (Signed)
SURGICAL PROGRESS NOTE (cpt [phone])  Hospital Day(s): 8.   Post op day(s): 2 Days Post-Op.   Interval History: Patient seen and examined, no acute events or new complaints overnight. Patient continues to report her abdomen is feeling better with a small amount of flatus this morning and ambulation yesterday, denies abdominal pain, N/V, fever/chills, CP, or SOB. Patient only otherwise complains of a sore throat with NG tube and expresses she is eager to eat and return home.  Review of Systems:  Constitutional: denies fever, chills  HEENT: denies cough or congestion  Respiratory: denies any shortness of breath  Cardiovascular: denies chest pain or palpitations  Gastrointestinal: abdominal pain, N/V, and bowel function as per interval history Genitourinary: denies burning with urination or urinary frequency Musculoskeletal: denies pain, decreased motor or sensation Integumentary: denies any other rashes or skin discolorations Neurological: denies HA or vision/hearing changes   Vital signs in last 24 hours: [min-max] current  Temp:  [98.1 F (36.7 C)-98.4 F (36.9 C)] 98.1 F (36.7 C) (08/16 0445) Pulse Rate:  [96-113] 109 (08/16 0445) Resp:  [18-20] 20 (08/16 0445) BP: (120-138)/(61-84) 138/84 (08/16 0445) SpO2:  [95 %-97 %] 96 % (08/16 0445) Weight:  [111 lb 11.2 oz (50.7 kg)] 111 lb 11.2 oz (50.7 kg) (08/16 0500)     Height: 4\' 11"  (149.9 cm) Weight: 111 lb 11.2 oz (50.7 kg) BMI (Calculated): 22.9   Intake/Output this shift:  No intake/output data recorded.   Intake/Output last 2 shifts:  @IOLAST2SHIFTS @   Physical Exam:  Constitutional: alert, cooperative and no distress  HENT: normocephalic without obvious abnormality  Eyes: PERRL, EOM's grossly intact and symmetric  Neuro: CN II - XII grossly intact and symmetric without deficit  Respiratory: breathing non-labored at rest  Cardiovascular: regular rate and sinus rhythm  Gastrointestinal: soft, completely non-tender, and  completely non-distended, NG tube well-secured with just over 700 mL in canister Musculoskeletal: UE and LE FROM, no edema or wounds, motor and sensation grossly intact, NT  Labs:  CBC Latest Ref Rng & Units 03/14/2017 03/11/2017 03/10/2017  WBC 3.6 - 11.0 K/uL 14.3(H) 20.3(H) 13.0(H)  Hemoglobin 12.0 - 16.0 g/dL 11.6(L) 13.0 12.0  Hematocrit 35.0 - 47.0 % 34.7(L) 40.2 36.7  Platelets 150 - 440 K/uL 208 264 258   CMP Latest Ref Rng & Units 03/14/2017 03/12/2017 03/11/2017  Glucose 65 - 99 mg/dL 116(H) 138(H) 163(H)  BUN 6 - 20 mg/dL 62(H) 54(H) 39(H)  Creatinine 0.44 - 1.00 mg/dL 1.24(H) 1.46(H) 1.12(H)  Sodium 135 - 145 mmol/L 142 140 144  Potassium 3.5 - 5.1 mmol/L 4.2 3.8 4.1  Chloride 101 - 111 mmol/L 110 107 112(H)  CO2 22 - 32 mmol/L 25 26 25   Calcium 8.9 - 10.3 mg/dL 8.9 9.4 9.6  Total Protein 6.5 - 8.1 g/dL - - 6.9  Total Bilirubin 0.3 - 1.2 mg/dL - - 0.3  Alkaline Phos 38 - 126 U/L - - 89  AST 15 - 41 U/L - - 25  ALT 14 - 54 U/L - - 14   Imaging studies: No new pertinent imaging studies   Assessment/Plan:(ICD-10's: K56.52) 83 y.o.femalewith likely small bowel obstruction, attributable to post-surgical (hysterectomy) adhesions 2 Days s/p endoscopy-assisted placement of NG tube for nasogastric decompression, complicated by pertinent comorbidities including HTN, HLD, CKD, and GERD.  - TPN + ice chips for comfort - monitor abdominal exam and bowel function - favor monitoring clinical progression over daily x-rays - appreciate endoscopy-assisted insertion of NG tube for nasogastric decompression by Dr.  Wohl - typically anticipate symptomatic relief within 24 - 48 hours following NGT insertion, followed by "rumbling" the following day and flatus either the same day or the day following the "rumbling" with anticipated length of stay ~3 - 5 days with successful non-operative management for 8 of 10 patients with small bowel  obstruction secondary to adhesions, though patient's course thus far has been atypical, and patient and her family agree that she does not wish to consider any surgery under any circumstances             - trend/follow-up WBC tomorrow morning (significantly decreased today)  - anticipate removal of NG tube tomorrow with starting clear liquids - medical management as per medical team - ambulation encouraged - DVT prophylaxis  All of the above findings and recommendations were discussed with the patient, patient's family, and the medical team, and all of patient's and herfamily's questions were answered to their expressed satisfaction.  -- Marilynne Drivers Rosana Hoes, MD, [place]: [facility] - Partnering for exceptional care. Office: [phone]

## 2017-03-15 LAB — GLUCOSE, CAPILLARY
GLUCOSE-CAPILLARY: 122 mg/dL — AB (ref 65–99)
GLUCOSE-CAPILLARY: 130 mg/dL — AB (ref 65–99)
GLUCOSE-CAPILLARY: 141 mg/dL — AB (ref 65–99)
Glucose-Capillary: 136 mg/dL — ABNORMAL HIGH (ref 65–99)
Glucose-Capillary: 142 mg/dL — ABNORMAL HIGH (ref 65–99)
Glucose-Capillary: 124 mg/dL — ABNORMAL HIGH (ref 65–99)

## 2017-03-15 LAB — CBC
HCT: 33.9 % — ABNORMAL LOW (ref 35.0–47.0)
Hemoglobin: 11.4 g/dL — ABNORMAL LOW (ref 12.0–16.0)
MCH: 29.3 pg (ref 26.0–34.0)
MCHC: 33.7 g/dL (ref 32.0–36.0)
MCV: 86.9 fL (ref 80.0–100.0)
PLATELETS: 216 10*3/uL (ref 150–440)
RBC: 3.9 MIL/uL (ref 3.80–5.20)
RDW: 16.6 % — ABNORMAL HIGH (ref 11.5–14.5)
WBC: 14.2 10*3/uL — ABNORMAL HIGH (ref 3.6–11.0)

## 2017-03-15 LAB — BASIC METABOLIC PANEL
Anion gap: 7 (ref 5–15)
BUN: 59 mg/dL — AB (ref 6–20)
CHLORIDE: 109 mmol/L (ref 101–111)
CO2: 26 mmol/L (ref 22–32)
CREATININE: 1.12 mg/dL — AB (ref 0.44–1.00)
Calcium: 9.2 mg/dL (ref 8.9–10.3)
GFR calc Af Amer: 47 mL/min — ABNORMAL LOW (ref >60)
GFR calc non Af Amer: 41 mL/min — ABNORMAL LOW (ref >60)
GLUCOSE: 144 mg/dL — AB (ref 65–99)
Potassium: 4.3 mmol/L (ref 3.5–5.1)
Sodium: 142 mmol/L (ref 135–145)

## 2017-03-15 LAB — HEPATIC FUNCTION PANEL
ALBUMIN: 2.7 g/dL — AB (ref 3.5–5.0)
ALT: 25 U/L (ref 14–54)
AST: 29 U/L (ref 15–41)
Alkaline Phosphatase: 97 U/L (ref 38–126)
BILIRUBIN INDIRECT: 0.4 mg/dL (ref 0.3–0.9)
BILIRUBIN TOTAL: 0.5 mg/dL (ref 0.3–1.2)
Bilirubin, Direct: 0.1 mg/dL (ref 0.1–0.5)
TOTAL PROTEIN: 6.9 g/dL (ref 6.5–8.1)

## 2017-03-15 LAB — MAGNESIUM: Magnesium: 2.3 mg/dL (ref 1.7–2.4)

## 2017-03-15 LAB — PHOSPHORUS: PHOSPHORUS: 3 mg/dL (ref 2.5–4.6)

## 2017-03-15 LAB — TRIGLYCERIDES: Triglycerides: 46 mg/dL (ref <150)

## 2017-03-15 MED ORDER — TRACE MINERALS CR-CU-MN-SE-ZN 10-1000-500-60 MCG/ML IV SOLN
INTRAVENOUS | Status: AC
  Administered 2017-03-15: 18:00:00 via INTRAVENOUS
  Filled 2017-03-15: qty 1320

## 2017-03-15 MED ORDER — MENTHOL 3 MG MT LOZG
1.0000 | LOZENGE | OROMUCOSAL | Status: DC | PRN
  Administered 2017-03-15: 3 mg via ORAL
  Filled 2017-03-15 (×3): qty 9

## 2017-03-15 MED ORDER — BOOST / RESOURCE BREEZE PO LIQD
1.0000 | Freq: Three times a day (TID) | ORAL | Status: DC
  Administered 2017-03-15 – 2017-03-16 (×3): 1 via ORAL

## 2017-03-15 MED ORDER — ACETAMINOPHEN 10 MG/ML IV SOLN
1000.0000 mg | Freq: Four times a day (QID) | INTRAVENOUS | Status: AC | PRN
  Administered 2017-03-15: 1000 mg via INTRAVENOUS
  Filled 2017-03-15: qty 100

## 2017-03-15 MED ORDER — ALUM & MAG HYDROXIDE-SIMETH 200-200-20 MG/5ML PO SUSP
30.0000 mL | Freq: Four times a day (QID) | ORAL | Status: DC | PRN
  Administered 2017-03-15 – 2017-03-17 (×2): 30 mL via ORAL
  Filled 2017-03-15 (×2): qty 30

## 2017-03-15 MED ORDER — FAT EMULSION 20 % IV EMUL
250.0000 mL | INTRAVENOUS | Status: AC
  Administered 2017-03-15: 250 mL via INTRAVENOUS
  Filled 2017-03-15: qty 250

## 2017-03-15 NOTE — Progress Notes (Signed)
[facility] [place] NAME: Kimberly Abbott    MR#:  [id number]  DATE OF BIRTH:  11-13-1920  SUBJECTIVE:  CHIEF COMPLAINT:   Chief Complaint  Patient presents with  . Abdominal Pain  . Emesis  The patient is 83 year old female with past medical history significant for history of hypertension, hyperlipidemia, CAD, gastroesophageal reflux disease, multiple abdominal surgeries, including appendectomy, rectal fistula surgery, tubal ligation, who presents to the hospital with complaints of nausea, vomiting, abdominal pain. CAT scan in the emergency room revealed partial small bowel obstruction. Patient continues to have some abdominal distention, no more nausea,  vomiting, she is passing some gas per rectum, but no stool since 03/10/2017. She was seen by surgeon, who recommended NG tube placement and suctioning, NGT was discontinued today  PatientIs admitted to the hospitalist service but changed to general surgery service 03/12/17 for persistent ileus/SBO   Patient complains of sore throat.    Review of Systems  Constitutional: Negative for chills, fever and weight loss.  HENT: Positive for sore throat. Negative for congestion.   Eyes: Negative for blurred vision and double vision.  Respiratory: Negative for cough, sputum production, shortness of breath and wheezing.   Cardiovascular: Negative for chest pain, palpitations, orthopnea, leg swelling and PND.  Gastrointestinal: Positive for constipation. Negative for abdominal pain, blood in stool, diarrhea, nausea and vomiting.  Genitourinary: Negative for dysuria, frequency, hematuria and urgency.  Musculoskeletal: Negative for falls.  Neurological: Negative for dizziness, tremors, focal weakness and headaches.  Endo/Heme/Allergies: Does not bruise/bleed easily.  Psychiatric/Behavioral: Negative for depression. The patient does not have insomnia.     VITAL SIGNS: Blood pressure 140/75,  pulse 96, temperature 97.8 F (36.6 C), temperature source Oral, resp. rate 16, height 4\' 11"  (1.499 m), weight 50.9 kg (112 lb 4.8 oz), SpO2 96 %.  PHYSICAL EXAMINATION:   GENERAL:  83 y.o.-year-old patient lying in the bed with no acute distress. The patient is hoarse EYES: Pupils equal, round, reactive to light and accommodation. No scleral icterus. Extraocular muscles intact.  HEENT: Head atraumatic, normocephalic. NECK:  Supple, no jugular venous distention. No thyroid enlargement, no tenderness.  LUNGS: Normal breath sounds bilaterally, no wheezing, rales,rhonchi or crepitation. No use of accessory muscles of respiration.  CARDIOVASCULAR: S1, S2 normal. No murmurs, rubs, or gallops.  ABDOMEN:  minimal  Hypoactive bowel sounds and some abdominal distention, soft, no guarding  EXTREMITIES: No pedal edema, cyanosis, or clubbing.  NEUROLOGIC: Cranial nerves II through XII are intact. Muscle strength  at has baseline Sensation intact. Gait not checked.  PSYCHIATRIC: The patient is alert and oriented x 3.  SKIN: No obvious rash, lesion, or ulcer.   ORDERS/RESULTS REVIEWED:   CBC  Recent Labs Lab 03/09/17 0354 03/10/17 0332 03/11/17 0401 03/14/17 0630 03/15/17 0454  WBC 10.0 13.0* 20.3* 14.3* 14.2*  HGB 12.4 12.0 13.0 11.6* 11.4*  HCT 38.1 36.7 40.2 34.7* 33.9*  PLT 264 258 264 208 216  MCV 88.6 87.3 87.6 87.6 86.9  MCH 28.8 28.5 28.4 29.3 29.3  MCHC 32.5 32.7 32.4 33.5 33.7  RDW 16.3* 15.9* 16.2* 16.6* 16.6*   ------------------------------------------------------------------------------------------------------------------  Chemistries   Recent Labs Lab 03/09/17 0354  03/10/17 0332 03/11/17 0401 03/12/17 0317 03/14/17 0630 03/15/17 0454 03/15/17 1154  NA 142  --  145 144 140 142 142  --   K 4.1  --  3.3* 4.1 3.8 4.2 4.3  --   CL 111  --  114* 112*  107 110 109  --   CO2 21*  --  23 25 26 25 26   --   GLUCOSE 78  --  181* 163* 138* 116* 144*  --   BUN 25*  --  30*  39* 54* 62* 59*  --   CREATININE 1.33*  --  1.45* 1.12* 1.46* 1.24* 1.12*  --   CALCIUM 9.4  --  9.4 9.6 9.4 8.9 9.2  --   MG  --   < > 2.0 2.1 2.1 2.3 2.3  --   AST 27  --  22 25  --   --   --  29  ALT 16  --  14 14  --   --   --  25  ALKPHOS 112  --  101 89  --   --   --  97  BILITOT 0.8  --  0.6 0.3  --   --   --  0.5  < > = values in this interval not displayed. ------------------------------------------------------------------------------------------------------------------ estimated creatinine clearance is 20.9 mL/min (A) (by C-G formula based on SCr of 1.12 mg/dL (H)). ------------------------------------------------------------------------------------------------------------------ No results for input(s): TSH, T4TOTAL, T3FREE, THYROIDAB in the last 72 hours.  Invalid input(s): FREET3  Cardiac Enzymes No results for input(s): CKMB, TROPONINI, MYOGLOBIN in the last 168 hours.  Invalid input(s): CK ------------------------------------------------------------------------------------------------------------------ Invalid input(s): POCBNP ---------------------------------------------------------------------------------------------------------------  RADIOLOGY: No results found.  EKG:  Orders placed or performed during the hospital encounter of 09/05/16  . ED EKG  . ED EKG    ASSESSMENT AND PLAN:  Active Problems:   Partial small bowel obstruction (HCC)   Ileus (HCC)   Protein-calorie malnutrition, severe   Problems with swallowing and mastication   #  Acute renal injury, known CAD stage III, improved on IV fluid administration Monitor renal function and avoid nephrotoxins Creatinine 1.7-1.65-1.33 -1.45 --1.46- 1.24-1.12, improved to baseline  #. Hypokalemia resolved Repleted and rechecking PRN  #. Hypertension- blood pressure improved , continue current therapy   #. Anemia, hemoglobin level is stable today, no active bleeding noted.  #  small bowel obstruction     Management per surgery  Clinically better, with some flatus Status post EGD and NG tube placement 03/12/17, and she tube is discontinued today, patient is initiated on a clear liquid diet, continue TPN Monitor intake and output      # . Generalized weakness, PT recommends HHPT  # Sore throat, initiate patient on Cepacol lozenges  Patient is transferred to surgery service 03/12/17   Management plans discussed with the patient, family at bedside and they are in agreement.    DRUG ALLERGIES:  Allergies  Allergen Reactions  . Penicillin G Hives  . Penicillins Hives    Has patient had a PCN reaction causing immediate rash, facial/tongue/throat swelling, SOB or lightheadedness with hypotension: No Has patient had a PCN reaction causing severe rash involving mucus membranes or skin necrosis: No Has patient had a PCN reaction that required hospitalization No Has patient had a PCN reaction occurring within the last 10 years: No If all of the above answers are "NO", then may proceed with Cephalosporin use.    CODE STATUS:     Code Status Orders        Start     Ordered   03/06/17 2034  Full code  Continuous     03/06/17 2033    Code Status History    Date Active Date Inactive Code Status Order ID Comments User Context  06/08/2016 11:37 AM 06/10/2016  3:53 PM Full Code [id number]  Fritzi Mandes, MD Inpatient    Advance Directive Documentation     Most Recent Value  Type of Advance Directive  Healthcare Power of [place], Living will  Pre-existing out of facility DNR order (yellow form or pink MOST form)  -  "MOST" Form in Place?  -      TOTAL TIME TAKING CARE OF THIS PATIENT: 35 minutes.    Theodoro Grist M.D on 03/15/2017 at 1:28 PM  Between 7am to 6pm - Pager - [phone]  After 6pm go to [email or web address] - password [email or web address] [facility]  Office  [phone]  CC: Primary care physician; Adline Potter, MD

## 2017-03-15 NOTE — Progress Notes (Addendum)
Nutrition Follow-up  DOCUMENTATION CODES:   Severe malnutrition in context of chronic illness  INTERVENTION:  Recommend continue Clinimix 5/15, add Electrolytes, trace minerals, and MVI at 67mL/hr, ILE 20% at 9mL/hr x12 hrs  Recommend continue CLD pending tolerance, advance slowly.  Received verbal order from Dr. Rosana Hoes to check triglycerides, LFTs, and order Boost Breeze po TID, each supplement provides 250 kcal and 9 grams of protein  Discussed with Dr. Rosana Hoes: If patient tolerates diet and is advanced today to Full Liquids/SOFT, recommend half the rate of TPN tomorrow, and consider D/C TPN sunday pending tolerance of diet and labs.  NUTRITION DIAGNOSIS:   Malnutrition related to poor appetite (advanced age) as evidenced by severe depletion of muscle mass, severe depletion of body fat. -ongoing  GOAL:   Patient will meet greater than or equal to 90% of their needs -meeting  MONITOR:   Diet advancement, Labs, Weight trends, I & O's, Other (Comment) (TPN)  ASSESSMENT:   83 year old female with past medical history significant for history of hypertension, hyperlipidemia, CAD, gastroesophageal reflux disease, multiple abdominal surgeries, including appendectomy, rectal fistula surgery, tubal ligation, who presents to the hospital with complaints of nausea, vomiting, abdominal pain. CAT scan in the emergency room revealed partial small bowel obstruction.  Monitor diet tolerance. She said she was only able to consume small amounts of clear liquids this morning. Still very weak. Speaking better, throat sore. Coughing up secretions. NGT removed.  Discussed case with Dr. Rosana Hoes, surgery.  UOP 1353mL/hr last 24hrs, 6.5L fluid positive.  Access: L Brachial PICC  Labs reviewed:  CBGs 136, 122, 130 BUN/Creatinine 59/1.12  Medications reviewed and include:  Novolog 0-9 Units  Diet Order:  .TPN (CLINIMIX-E) Adult Diet clear liquid Room service appropriate? Yes; Fluid consistency:  Thin  Skin:  Reviewed, no issues  Last BM:  8/12-type 6  Height:   Ht Readings from Last 1 Encounters:  03/06/17 4\' 11"  (1.499 m)    Weight:   Wt Readings from Last 1 Encounters:  03/15/17 112 lb 4.8 oz (50.9 kg)    Ideal Body Weight:  44.5 kg  BMI:  Body mass index is 22.68 kg/m.  Estimated Nutritional Needs:   Kcal:  1200-1500kcal/day   Protein:  67-77g/day   Fluid:  >1.5L/day   EDUCATION NEEDS:   No education needs identified at this time  Kimberly Anis. Sohana Austell, MS, RD LDN Inpatient Clinical Dietitian Pager [phone]

## 2017-03-15 NOTE — Progress Notes (Signed)
Physical Therapy Treatment Patient Details Name: Kimberly Abbott MRN: [id number] DOB: May 11, 1921 Today's Date: 03/15/2017    History of Present Illness Pt is 83 year old female with past medical history significant for history of hypertension, hyperlipidemia, CAD, gastroesophageal reflux disease, multiple abdominal surgeries, including appendectomy, rectal fistula surgery, tubal ligation, who presents to the hospital with complaints of nausea, vomiting, abdominal pain. CAT scan in the emergency room revealed partial small bowel obstruction. Patient continues to have abdominal distention, nausea, but no vomiting, she is passing some gas per rectum, but no stool.  Assessment includes: Partial small bowel obstruction versus ileus, Acute renal injury, known CAD stage III, hyperkalemia, hyponatremia, and anemia.    PT Comments    Pt is eager and willing to participate with PT but is extremely weak, has shortness of breath (O2 sats mid 90s on room air), and was in relative constant distress.  She was having severe R flank/abdominal pain that calmed some during mobility (sitting/standing) - assumed gas pain from NG tube removal.   Pt's daughter present and helpful t/o session.  Pt very functionally limited today despite willingness to work with PT.   Follow Up Recommendations  Home health PT (per progress, pt may now be more appropriate for rehab )     Equipment Recommendations       Recommendations for Other Services       Precautions / Restrictions Precautions Precautions: Fall Restrictions Weight Bearing Restrictions: No    Mobility  Bed Mobility Overal bed mobility: Needs Assistance Bed Mobility: Supine to Sit     Supine to sit: Mod assist     General bed mobility comments: Pt had difficulty getting up to EOB, struggled to keep herself upright and actually fell back onto the bed numerous times  Transfers Overall transfer level: Needs assistance Equipment used: Rolling walker (2  wheeled) Transfers: Sit to/from Stand Sit to Stand: Min assist         General transfer comment: Pt had multiple bouts of knee bucking with standing (~2 minutes) and then during transfer to recliner  Ambulation/Gait             General Gait Details: No true ambulation, she was able to do some side shuffling to get to the recliner just next to bed.  Apparently pt was able to walk to the nurses' station yesterday with nursing - the possibility of doing that at this time was non-existent given pt's demeanor   Stairs            Wheelchair Mobility    Modified Rankin (Stroke Patients Only)       Balance Overall balance assessment: Needs assistance Sitting-balance support: Bilateral upper extremity supported Sitting balance-Leahy Scale: Poor Sitting balance - Comments: Pt falling back onto bed and needing assist to stay upright for just a few seconds at a time     Standing balance-Leahy Scale: Poor Standing balance comment: Pt struggled to stay upright even with heavy walker use, at least 10 bouts of knee buckling and near collapse during standing.                             Cognition Arousal/Alertness: Lethargic Behavior During Therapy: WFL for tasks assessed/performed Overall Cognitive Status: Difficult to assess                                 General Comments: NG tube  out, still largely non-verbal - pt in aparent distress t/o the session (vitals stable)      Exercises General Exercises - Lower Extremity Ankle Circles/Pumps: AROM;Both;10 reps Long Arc Quad: AROM;Strengthening;10 reps Heel Slides: Strengthening;AROM;10 reps Hip ABduction/ADduction: AROM;Strengthening;10 reps Hip Flexion/Marching: AROM;Strengthening;10 reps    General Comments        Pertinent Vitals/Pain Pain Assessment: 0-10 Pain Score: 5  Pain Location: abdominal/R flank pain    Home Living                      Prior Function            PT  Goals (current goals can now be found in the care plan section) Progress towards PT goals: Not progressing toward goals - comment (Pt very weak and in some apparent distress today)    Frequency    Min 2X/week      PT Plan Current plan remains appropriate    Co-evaluation              AM-PAC PT "6 Clicks" Daily Activity  Outcome Measure  Difficulty turning over in bed (including adjusting bedclothes, sheets and blankets)?: Unable Difficulty moving from lying on back to sitting on the side of the bed? : Unable Difficulty sitting down on and standing up from a chair with arms (e.g., wheelchair, bedside commode, etc,.)?: Unable Help needed moving to and from a bed to chair (including a wheelchair)?: A Lot Help needed walking in hospital room?: A Lot Help needed climbing 3-5 steps with a railing? : Total 6 Click Score: 8    End of Session Equipment Utilized During Treatment: Gait belt Activity Tolerance: Patient limited by fatigue;Patient limited by lethargy Patient left: with chair alarm set;with call bell/phone within reach;with family/visitor present   PT Visit Diagnosis: Muscle weakness (generalized) (M62.81)     Time: 1655-3748 PT Time Calculation (min) (ACUTE ONLY): 28 min  Charges:  $Therapeutic Exercise: 8-22 mins $Therapeutic Activity: 8-22 mins                    G Codes:       Kreg Shropshire, DPT 03/15/2017, 3:24 PM

## 2017-03-15 NOTE — Progress Notes (Signed)
PHARMACY - ADULT TOTAL PARENTERAL NUTRITION CONSULT NOTE   Pharmacy Consult for TPN ordering and monitoring  Indication: Severe malnutrition in context of chronic illness; prolonged Ileus   Patient Measurements:  Height: 4\' 11"  (149.9 cm) Weight: 112 lb 4.8 oz (50.9 kg) IBW/kg (Calculated) : 43.2 TPN AdjBW (KG): 51.3 Body mass index is 22.68 kg/m.   Assessment: 83 yo female with prolonged Ileus (SBO vs Ileus)   GI: LBM: 8/10 Endo: BGs: stable  Insulin requirements in the past 24 hours: 7 units Lytes:  K: 3.3,  Mg: 2.0,  Phos: 3.4 Renal:Scr: 1.45,  CrCl: 16.2  Best Practices: TPN Access:8/11 TPN start date:8/11  Nutritional Goals (per RD recommendation on 8/12):  KCal: 1200-1500kcal/day Protein: 67-77 g/day Fluid: >1.5L/day  Current Nutrition: NS 45ml/hr, TPN (Clinimix E 5/15) at 40 ml/hr.  Plan:  Recommend discontinue IVFs.   Ordered KCl 74meq IV this morning. Continue to monitor and supplement K, Mg, and P as needed  TPN- Clinimix 5%/15% with electrolytes (add MVI and trace elements)- advance to goal rate @65ml /hr.  Add 20% lipids at 33ml/hr x 12 hrs. Regimen provides 1468kcal/day, 78g/day protein, and 1579ml volume  Every 4 hours SSI ordered and adjust as needed  Monitor TPN labs daily   8/13 0401 K 4.1, Ca 9.6, albumin 3.5, Mg 2.1, phos 1.3. Give sodium phosphate 15 mmol IV x 1. Will recheck electrolytes tomorrow with AM labs.  8/13 13:10 spoke with RD - continue same TPN and ILE formula/volume/rate - reassess tomorrow.  8/14 0317 K 3.8, Ca 9.4, Mg 2.1, Phos 2.8. No further supplementation at this time. Will recheck electrolytes Thursday with AM labs.  8/14 1029 RD says to continue TPN.   8/15 1122 TPN to continue. At goal. Will recheck BMP/Mg/Phos tomorrow with AM labs. Albumin, triglycerides, CBC, LFTs will be due on Monday 8/20.  8/16 0630 Electrolytes WNL. No further supplement warranted at this point. Will recheck albumin, CBC, BMP, Mg, Phos, LFTs,  and triglycerides on Monday 8/20.   8/16 0941 RD recommends decreasing TPN to 55 mL/hr due to increased BUN (62). BUN/SCr 50, patient had 700 mL output from NG yesterday. No change to the fats.    8/17 1312 RD recommends continuing TPN at 55 mL/hr and ILE 15 mL/hr x 12 hr. May decrease further tomorrow if patient's intake improves.   Laural Benes, Pharm.D., BCPS Clinical Pharmacist 03/15/2017 1:12 PM

## 2017-03-15 NOTE — Progress Notes (Signed)
SURGICAL PROGRESS NOTE (cpt [phone])  Hospital Day(s): 9.   Post op day(s): 3 Days Post-Op.   Interval History: Patient seen and examined, no acute events or new complaints overnight. Patient reports passing consistent flatus, denies any abdominal pain, N/V, fever/chills, CP, or SOB, though continues to cough up phlegm.  Review of Systems:  Constitutional: denies fever, chills  HEENT: denies cough or congestion  Respiratory: denies any shortness of breath  Cardiovascular: denies chest pain or palpitations  Gastrointestinal: abdominal pain, N/V, and bowel function as per interval history Genitourinary: denies burning with urination or urinary frequency Musculoskeletal: denies pain, decreased motor or sensation Integumentary: denies any other rashes or skin discolorations Neurological: denies HA or vision/hearing changes   Vital signs in last 24 hours: [min-max] current  Temp:  [97.5 F (36.4 C)-98.4 F (36.9 C)] 97.5 F (36.4 C) (08/17 0413) Pulse Rate:  [104-106] 104 (08/17 0413) Resp:  [16-22] 16 (08/17 0413) BP: (148-157)/(66-74) 148/66 (08/17 0413) SpO2:  [96 %-98 %] 98 % (08/17 0413) Weight:  [112 lb 4.8 oz (50.9 kg)] 112 lb 4.8 oz (50.9 kg) (08/17 0413)     Height: 4\' 11"  (149.9 cm) Weight: 112 lb 4.8 oz (50.9 kg) BMI (Calculated): 22.9   Intake/Output this shift:  No intake/output data recorded.   Intake/Output last 2 shifts:  @IOLAST2SHIFTS @   Physical Exam:  Constitutional: alert, cooperative and no distress  HENT: normocephalic without obvious abnormality  Eyes: PERRL, EOM's grossly intact and symmetric  Neuro: CN II - XII grossly intact and symmetric without deficit  Respiratory: breathing non-labored at rest  Cardiovascular: regular rate and sinus rhythm  Gastrointestinal: soft, non-tender, and non-distended  Musculoskeletal: UE and LE FROM, no edema or wounds, motor and sensation grossly intact, NT   Labs:  CBC Latest Ref Rng & Units 03/15/2017 03/14/2017  03/11/2017  WBC 3.6 - 11.0 K/uL 14.2(H) 14.3(H) 20.3(H)  Hemoglobin 12.0 - 16.0 g/dL 11.4(L) 11.6(L) 13.0  Hematocrit 35.0 - 47.0 % 33.9(L) 34.7(L) 40.2  Platelets 150 - 440 K/uL 216 208 264   CMP Latest Ref Rng & Units 03/15/2017 03/14/2017 03/12/2017  Glucose 65 - 99 mg/dL 144(H) 116(H) 138(H)  BUN 6 - 20 mg/dL 59(H) 62(H) 54(H)  Creatinine 0.44 - 1.00 mg/dL 1.12(H) 1.24(H) 1.46(H)  Sodium 135 - 145 mmol/L 142 142 140  Potassium 3.5 - 5.1 mmol/L 4.3 4.2 3.8  Chloride 101 - 111 mmol/L 109 110 107  CO2 22 - 32 mmol/L 26 25 26   Calcium 8.9 - 10.3 mg/dL 9.2 8.9 9.4  Total Protein 6.5 - 8.1 g/dL - - -  Total Bilirubin 0.3 - 1.2 mg/dL - - -  Alkaline Phos 38 - 126 U/L - - -  AST 15 - 41 U/L - - -  ALT 14 - 54 U/L - - -   Imaging studies: No new pertinent imaging studies  Assessment/Plan:(ICD-10's: K56.52) 83 y.o.femalewith resolving small bowel obstruction, attributable to post-surgical (hysterectomy) adhesions 3 Days s/p endoscopy-assisted placement of NG tube for nasogastric decompression, complicated by likewise resolving AKI and by pertinent comorbidities including HTN, HLD, CKD, and GERD.   - NG tube removed, clear liquids diet ordered - monitor abdominal exam and bowel function - will continue TPN for today (discussed with dietary) - favor monitoring clinical progression over daily x-rays - trend/follow-up WBC tomorrow morning (essentially stable/unchanged today)  - may consider chest x-ray if leukocytosis persists or symptoms suggest aspiration - medical management as per medical team - ambulation encouraged - DVT prophylaxis  All  of the above findings and recommendations were discussed with the patient, patient's family, and the medical team, and all of patient's and herfamily's questions were answered to their expressed satisfaction.  -- Marilynne Drivers Rosana Hoes, MD, [place]: [facility] - Partnering for exceptional care. Office: [phone]

## 2017-03-16 LAB — CBC
HCT: 33.5 % — ABNORMAL LOW (ref 35.0–47.0)
Hemoglobin: 11 g/dL — ABNORMAL LOW (ref 12.0–16.0)
MCH: 29.9 pg (ref 26.0–34.0)
MCHC: 32.9 g/dL (ref 32.0–36.0)
MCV: 91 fL (ref 80.0–100.0)
PLATELETS: 211 10*3/uL (ref 150–440)
RBC: 3.68 MIL/uL — AB (ref 3.80–5.20)
RDW: 16.5 % — ABNORMAL HIGH (ref 11.5–14.5)
WBC: 12.9 10*3/uL — AB (ref 3.6–11.0)

## 2017-03-16 LAB — GLUCOSE, CAPILLARY
GLUCOSE-CAPILLARY: 123 mg/dL — AB (ref 65–99)
GLUCOSE-CAPILLARY: 104 mg/dL — AB (ref 65–99)
Glucose-Capillary: 125 mg/dL — ABNORMAL HIGH (ref 65–99)
Glucose-Capillary: 124 mg/dL — ABNORMAL HIGH (ref 65–99)
Glucose-Capillary: 122 mg/dL — ABNORMAL HIGH (ref 65–99)
Glucose-Capillary: 82 mg/dL (ref 65–99)

## 2017-03-16 LAB — PHOSPHORUS: PHOSPHORUS: 2.9 mg/dL (ref 2.5–4.6)

## 2017-03-16 LAB — BASIC METABOLIC PANEL
Anion gap: 7 (ref 5–15)
BUN: 57 mg/dL — AB (ref 6–20)
CHLORIDE: 113 mmol/L — AB (ref 101–111)
CO2: 26 mmol/L (ref 22–32)
CREATININE: 1.15 mg/dL — AB (ref 0.44–1.00)
Calcium: 9.3 mg/dL (ref 8.9–10.3)
GFR calc non Af Amer: 39 mL/min — ABNORMAL LOW (ref >60)
GFR, EST AFRICAN AMERICAN: 46 mL/min — AB (ref >60)
Glucose, Bld: 116 mg/dL — ABNORMAL HIGH (ref 65–99)
Potassium: 4.2 mmol/L (ref 3.5–5.1)
Sodium: 146 mmol/L — ABNORMAL HIGH (ref 135–145)

## 2017-03-16 LAB — MAGNESIUM: Magnesium: 2.3 mg/dL (ref 1.7–2.4)

## 2017-03-16 MED ORDER — FAT EMULSION 20 % IV EMUL
250.0000 mL | INTRAVENOUS | Status: AC
  Administered 2017-03-16: 250 mL via INTRAVENOUS
  Filled 2017-03-16: qty 250

## 2017-03-16 MED ORDER — TRACE MINERALS CR-CU-MN-SE-ZN 10-1000-500-60 MCG/ML IV SOLN
INTRAVENOUS | Status: AC
  Administered 2017-03-16: 20:00:00 via INTRAVENOUS
  Filled 2017-03-16: qty 1320

## 2017-03-16 NOTE — Evaluation (Signed)
Clinical/Bedside Swallow Evaluation Patient Details  Name: Kimberly Abbott MRN: [id number] Date of Birth: 1921-07-28  Today's Date: 03/16/2017 Time: SLP Start Time (ACUTE ONLY): 0957 SLP Stop Time (ACUTE ONLY): 1022 SLP Time Calculation (min) (ACUTE ONLY): 25 min  Past Medical History:  Past Medical History:  Diagnosis Date  . Allergy   . CKD (chronic kidney disease)   . GERD (gastroesophageal reflux disease)   . Hyperlipidemia   . Hypertension    Past Surgical History:  Past Surgical History:  Procedure Laterality Date  . APPENDECTOMY    . ESOPHAGOGASTRODUODENOSCOPY (EGD) WITH PROPOFOL N/A 03/12/2017   Procedure: ESOPHAGOGASTRODUODENOSCOPY (EGD) WITH PROPOFOL;  Surgeon: Lucilla Lame, MD;  Location: [facility] ENDOSCOPY;  Service: Endoscopy;  Laterality: N/A;  . rectal fistula  in the 40's  . TUBAL LIGATION     HPI:      Assessment / Plan / Recommendation Clinical Impression  pt presents with a moderate risk for aspiration as characterized by multiple GI concerns that do not seem to be related to oral or pharyngeal phase of swallow. pt is unable to keem most fods or liquids down at this time and has sensation of food getting stuck in esophagus and regurgitating the bolus. pt was able to have x4 bites applesauce and 5 sips water with min regurgitation noted.  ST will defer diet to GI at this time.  SLP Visit Diagnosis: Dysphagia, pharyngoesophageal phase (R13.14)    Aspiration Risk  Moderate aspiration risk    Diet Recommendation Thin liquid;Dysphagia 1 (Puree)   Liquid Administration via: Cup;Spoon;Straw Medication Administration: Crushed with puree Supervision: Patient able to self feed Compensations: Slow rate;Small sips/bites;Follow solids with liquid Postural Changes: Seated upright at 90 degrees    Other  Recommendations Recommended Consults: Consider GI evaluation Oral Care Recommendations: Oral care BID   Follow up Recommendations        Frequency and Duration min 3x  week  2 weeks       Prognosis Prognosis for Safe Diet Advancement: Guarded Barriers to Reach Goals: Severity of deficits      Swallow Study   General Date of Onset: 03/15/17 Type of Study: Bedside Swallow Evaluation Diet Prior to this Study: Thin liquids Temperature Spikes Noted: No Respiratory Status: Room air History of Recent Intubation: No Behavior/Cognition: Alert;Cooperative;Pleasant mood Oral Cavity Assessment: Within Functional Limits Oral Care Completed by SLP: No Oral Cavity - Dentition: Adequate natural dentition Vision: Functional for self-feeding Self-Feeding Abilities: Able to feed self Patient Positioning: Upright in chair Baseline Vocal Quality: Breathy Volitional Cough: Weak Volitional Swallow: Able to elicit    Oral/Motor/Sensory Function Overall Oral Motor/Sensory Function: Within functional limits   Ice Chips Ice chips: Within functional limits Presentation: Cup;Self Fed;Spoon   Thin Liquid Thin Liquid: Within functional limits Presentation: Cup;Self Fed;Spoon Other Comments: pt did regurgitate thin liquids, however this appears to be GI related.    Nectar Thick Nectar Thick Liquid: Not tested   Honey Thick Honey Thick Liquid: Not tested   Puree Puree: Within functional limits Presentation: Spoon;Self Fed   Solid   GO   Solid: Not tested    Functional Limitations: Swallowing Swallow Current Status (R1540): At least 40 percent but less than 60 percent impaired, limited or restricted Swallow Goal Status [id number]): At least 20 percent but less than 40 percent impaired, limited or restricted   [place] Sauber 03/16/2017,11:03 AM

## 2017-03-16 NOTE — Progress Notes (Signed)
[place] [facility] [place] NAME: Kimberly Abbott    MR#:  [id number]  DATE OF BIRTH:  07/10/1921  SUBJECTIVE:  CHIEF COMPLAINT:   Chief Complaint  Patient presents with  . Abdominal Pain  . Emesis  The patient is 83 year old female with past medical history significant for history of hypertension, hyperlipidemia, CAD, gastroesophageal reflux disease, multiple abdominal surgeries, including appendectomy, rectal fistula surgery, tubal ligation, who presents to the hospital with complaints of nausea, vomiting, abdominal pain. CAT scan in the emergency room revealed partial small bowel obstruction. Patient continues to have some nausea,  But no vomiting, she is passing gas per rectum, but no stool since 03/10/2017. She has been followed by general surgery, managed with NG tube placement and suctioning, NGT was discontinued 03/15/2017, oral intake remains minimal, but no vomiting, no pain.   PatientIs admitted to the hospitalist service but changed to general surgery service 03/12/17 for persistent ileus/SBO   Patient complains of sore throat.    Review of Systems  Constitutional: Negative for chills, fever and weight loss.  HENT: Positive for sore throat. Negative for congestion.   Eyes: Negative for blurred vision and double vision.  Respiratory: Negative for cough, sputum production, shortness of breath and wheezing.   Cardiovascular: Negative for chest pain, palpitations, orthopnea, leg swelling and PND.  Gastrointestinal: Positive for constipation. Negative for abdominal pain, blood in stool, diarrhea, nausea and vomiting.  Genitourinary: Negative for dysuria, frequency, hematuria and urgency.  Musculoskeletal: Negative for falls.  Neurological: Negative for dizziness, tremors, focal weakness and headaches.  Endo/Heme/Allergies: Does not bruise/bleed easily.  Psychiatric/Behavioral: Negative for depression. The patient does not have  insomnia.     VITAL SIGNS: Blood pressure (!) 136/93, pulse 80, temperature (!) 97.4 F (36.3 C), temperature source Oral, resp. rate (!) 22, height 4\' 11"  (1.499 m), weight 50.9 kg (112 lb 4.8 oz), SpO2 100 %.  PHYSICAL EXAMINATION:   GENERAL:  83 y.o.-year-old patient lying in the bed with no acute distress. The patient is hoarse EYES: Pupils equal, round, reactive to light and accommodation. No scleral icterus. Extraocular muscles intact.  HEENT: Head atraumatic, normocephalic. NECK:  Supple, no jugular venous distention. No thyroid enlargement, no tenderness.  LUNGS: Normal breath sounds bilaterally, no wheezing, rales,rhonchi or crepitation. No use of accessory muscles of respiration.  CARDIOVASCULAR: S1, S2 normal. No murmurs, rubs, or gallops.  ABDOMEN:  minimal  Hypoactive bowel sounds and some abdominal distention, soft, no guarding  EXTREMITIES: No pedal edema, cyanosis, or clubbing.  NEUROLOGIC: Cranial nerves II through XII are intact. Muscle strength  at has baseline Sensation intact. Gait not checked.  PSYCHIATRIC: The patient is alert and oriented x 3.  SKIN: No obvious rash, lesion, or ulcer.   ORDERS/RESULTS REVIEWED:   CBC  Recent Labs Lab 03/10/17 0332 03/11/17 0401 03/14/17 0630 03/15/17 0454 03/16/17 0339  WBC 13.0* 20.3* 14.3* 14.2* 12.9*  HGB 12.0 13.0 11.6* 11.4* 11.0*  HCT 36.7 40.2 34.7* 33.9* 33.5*  PLT 258 264 208 216 211  MCV 87.3 87.6 87.6 86.9 91.0  MCH 28.5 28.4 29.3 29.3 29.9  MCHC 32.7 32.4 33.5 33.7 32.9  RDW 15.9* 16.2* 16.6* 16.6* 16.5*   ------------------------------------------------------------------------------------------------------------------  Chemistries   Recent Labs Lab 03/10/17 0332 03/11/17 0401 03/12/17 0317 03/14/17 0630 03/15/17 0454 03/15/17 1154 03/16/17 0339  NA 145 144 140 142 142  --  146*  K 3.3* 4.1 3.8 4.2 4.3  --  4.2  CL 114*  112* 107 110 109  --  113*  CO2 23 25 26 25 26   --  26  GLUCOSE 181* 163*  138* 116* 144*  --  116*  BUN 30* 39* 54* 62* 59*  --  57*  CREATININE 1.45* 1.12* 1.46* 1.24* 1.12*  --  1.15*  CALCIUM 9.4 9.6 9.4 8.9 9.2  --  9.3  MG 2.0 2.1 2.1 2.3 2.3  --  2.3  AST 22 25  --   --   --  29  --   ALT 14 14  --   --   --  25  --   ALKPHOS 101 89  --   --   --  97  --   BILITOT 0.6 0.3  --   --   --  0.5  --    ------------------------------------------------------------------------------------------------------------------ estimated creatinine clearance is 20.4 mL/min (A) (by C-G formula based on SCr of 1.15 mg/dL (H)). ------------------------------------------------------------------------------------------------------------------ No results for input(s): TSH, T4TOTAL, T3FREE, THYROIDAB in the last 72 hours.  Invalid input(s): FREET3  Cardiac Enzymes No results for input(s): CKMB, TROPONINI, MYOGLOBIN in the last 168 hours.  Invalid input(s): CK ------------------------------------------------------------------------------------------------------------------ Invalid input(s): POCBNP ---------------------------------------------------------------------------------------------------------------  RADIOLOGY: No results found.  EKG:  Orders placed or performed during the hospital encounter of 09/05/16  . ED EKG  . ED EKG    ASSESSMENT AND PLAN:  Active Problems:   Partial small bowel obstruction (HCC)   Ileus (HCC)   Protein-calorie malnutrition, severe   Problems with swallowing and mastication   #  Acute renal injury, known CAD stage III, improved on IV fluid administration Monitor renal function and avoid nephrotoxins Creatinine 1.7-1.65-1.33 -1.45 --1.46- 1.24-1.12, improved to baseline  #. Hypokalemia resolved Repleted and rechecking PRN  #. Hypertension- blood pressure improved , continue current therapy   #. Anemia, hemoglobin level is stable today, no active bleeding noted.  #  small bowel obstruction    Management per surgery    Clinically better, with some flatus Status post EGD and NG tube placement 03/12/17, NG tube was discontinued  03/15/2017, discussed with Dr. Rosana Hoes, diet has been advanced,, continue TPN Monitor intake and output      # . Generalized weakness, PT recommends HHPT  # Sore throat, continue patient on Cepacol lozenges  # Leukocytosis, seems to be better than before, repeat chest x-ray to rule out aspiration pneumonitis. Patient continues to expectorate thick, clear to yellowish phlegm, but no fevers are reported as over the past 3 days. Discussed with Dr. Rosana Hoes, surgery  DRUG ALLERGIES:  Allergies  Allergen Reactions  . Penicillin G Hives  . Penicillins Hives    Has patient had a PCN reaction causing immediate rash, facial/tongue/throat swelling, SOB or lightheadedness with hypotension: No Has patient had a PCN reaction causing severe rash involving mucus membranes or skin necrosis: No Has patient had a PCN reaction that required hospitalization No Has patient had a PCN reaction occurring within the last 10 years: No If all of the above answers are "NO", then may proceed with Cephalosporin use.    CODE STATUS:     Code Status Orders        Start     Ordered   03/06/17 2034  Full code  Continuous     03/06/17 2033    Code Status History    Date Active Date Inactive Code Status Order ID Comments User Context   06/08/2016 11:37 AM 06/10/2016  3:53 PM Full Code [id number]  Fritzi Mandes,  MD Inpatient    Advance Directive Documentation     Most Recent Value  Type of Advance Directive  Healthcare Power of Attorney, Living will  Pre-existing out of facility DNR order (yellow form or pink MOST form)  -  "MOST" Form in Place?  -      TOTAL TIME TAKING CARE OF THIS PATIENT: 35 minutes.    Theodoro Grist M.D on 03/16/2017 at 12:16 PM  Between 7am to 6pm - Pager - [phone]  After 6pm go to [email or web address] - password [email or web address] [place] [facility]  Office   [phone]  CC: Primary care physician; Adline Potter, MD

## 2017-03-16 NOTE — Progress Notes (Signed)
PHARMACY - ADULT TOTAL PARENTERAL NUTRITION CONSULT NOTE   Pharmacy Consult for TPN ordering and monitoring  Indication: Severe malnutrition in context of chronic illness; prolonged Ileus   Patient Measurements:  Height: 4\' 11"  (149.9 cm) Weight: 112 lb 4.8 oz (50.9 kg) IBW/kg (Calculated) : 43.2 TPN AdjBW (KG): 51.3 Body mass index is 22.68 kg/m.   Assessment: 83 yo female with prolonged Ileus (SBO vs Ileus)   GI: LBM: 8/10 Endo: BGs: stable  Insulin requirements in the past 24 hours: 7 units  BMP Latest Ref Rng & Units 03/16/2017 03/15/2017 03/14/2017  Glucose 65 - 99 mg/dL 116(H) 144(H) 116(H)  BUN 6 - 20 mg/dL 57(H) 59(H) 62(H)  Creatinine 0.44 - 1.00 mg/dL 1.15(H) 1.12(H) 1.24(H)  BUN/Creat Ratio 12 - 28 - - -  Sodium 135 - 145 mmol/L 146(H) 142 142  Potassium 3.5 - 5.1 mmol/L 4.2 4.3 4.2  Chloride 101 - 111 mmol/L 113(H) 109 110  CO2 22 - 32 mmol/L 26 26 25   Calcium 8.9 - 10.3 mg/dL 9.3 9.2 8.9   Magnesium (mg/dL)  Date Value  03/16/2017 2.3  07/18/2014 2.5 (H)   Phosphorus (mg/dL)  Date Value  03/16/2017 2.9   Albumin (g/dL)  Date Value  03/15/2017 2.7 (L)  01/28/2017 4.2   Estimated Creatinine Clearance: 20.4 mL/min (A) (by C-G formula based on SCr of 1.15 mg/dL (H)).  CBG (last 3)   Recent Labs  03/16/17 0437 03/16/17 0802 03/16/17 1138  GLUCAP 124* 122* 82      Best Practices: TPN Access:8/11 TPN start date:8/11  Nutritional Goals (per RD recommendation on 8/12):  KCal: 1200-1500kcal/day Protein: 67-77 g/day Fluid: >1.5L/day  Current Nutrition: TPN (Clinimix E 5/15) at 37ml/hr.  Plan:  Electrolytes and glucose are WNL. Will continue TPN as ordered and f/u AM labs.   Ulice Dash D, Pharm.D., BCPS Clinical Pharmacist 03/16/2017 12:08 PM

## 2017-03-16 NOTE — Progress Notes (Signed)
SURGICAL PROGRESS NOTE (cpt [phone])  Hospital Day(s): 10.   Post op day(s): 4 Days Post-Op.   Interval History: Patient seen and examined, no acute events or new complaints overnight. Patient reports baseline dysphasia unchanged from home, but otherwise passing flatus and tolerating small amounts of clear liquids diet, denies abdominal pain, N/V, fever/chills, CP, or SOB.  Review of Systems:  Constitutional: denies fever, chills  HEENT: denies cough or congestion  Respiratory: denies any shortness of breath  Cardiovascular: denies chest pain or palpitations  Gastrointestinal: abdominal pain, N/V, and bowel function as per interval history Genitourinary: denies burning with urination or urinary frequency Musculoskeletal: denies pain, decreased motor or sensation Integumentary: denies any other rashes or skin discolorations Neurological: denies HA or vision/hearing changes   Vital signs in last 24 hours: [min-max] current  Temp:  [97.3 F (36.3 C)-97.8 F (36.6 C)] 97.4 F (36.3 C) (08/18 0427) Pulse Rate:  [80-117] 80 (08/18 0427) Resp:  [18-22] 22 (08/18 0427) BP: (136-182)/(65-93) 136/93 (08/18 0427) SpO2:  [96 %-100 %] 100 % (08/18 0427)     Height: 4\' 11"  (149.9 cm) Weight: 112 lb 4.8 oz (50.9 kg) BMI (Calculated): 22.9   Intake/Output this shift:  No intake/output data recorded.   Intake/Output last 2 shifts:  @IOLAST2SHIFTS @   Physical Exam:  Constitutional: alert, cooperative and no distress  HENT: normocephalic without obvious abnormality  Eyes: PERRL, EOM's grossly intact and symmetric  Neuro: CN II - XII grossly intact and symmetric without deficit  Respiratory: breathing non-labored at rest  Cardiovascular: regular rate and sinus rhythm  Gastrointestinal: soft, non-tender, and non-distended  Musculoskeletal: UE and LE FROM, no edema or wounds, motor and sensation grossly intact, NT   Labs:  CBC Latest Ref Rng & Units 03/16/2017 03/15/2017 03/14/2017  WBC 3.6 -  11.0 K/uL 12.9(H) 14.2(H) 14.3(H)  Hemoglobin 12.0 - 16.0 g/dL 11.0(L) 11.4(L) 11.6(L)  Hematocrit 35.0 - 47.0 % 33.5(L) 33.9(L) 34.7(L)  Platelets 150 - 440 K/uL 211 216 208   CMP Latest Ref Rng & Units 03/16/2017 03/15/2017 03/14/2017  Glucose 65 - 99 mg/dL 116(H) 144(H) 116(H)  BUN 6 - 20 mg/dL 57(H) 59(H) 62(H)  Creatinine 0.44 - 1.00 mg/dL 1.15(H) 1.12(H) 1.24(H)  Sodium 135 - 145 mmol/L 146(H) 142 142  Potassium 3.5 - 5.1 mmol/L 4.2 4.3 4.2  Chloride 101 - 111 mmol/L 113(H) 109 110  CO2 22 - 32 mmol/L 26 26 25   Calcium 8.9 - 10.3 mg/dL 9.3 9.2 8.9  Total Protein 6.5 - 8.1 g/dL - 6.9 -  Total Bilirubin 0.3 - 1.2 mg/dL - 0.5 -  Alkaline Phos 38 - 126 U/L - 97 -  AST 15 - 41 U/L - 29 -  ALT 14 - 54 U/L - 25 -   Imaging studies: No new pertinent imaging studies   Assessment/Plan:(ICD-10's: K56.52) 83 y.o.femalewith resolving small bowel obstruction, attributable to post-surgical (hysterectomy) adhesions s/p removal of endoscopy-assisted placement of NG tube for nasogastric decompression with decreasing leukocytosis, complicated by likewise resolving AKI and by pertinent comorbidities including HTN, HLD, CKD, and GERD.  - monitor abdominal exam and bowel function - advance to soft diet (no note from speech/swallow), half TPN - check WBC, possibly just once more tomorrow (decreasing, near-normal) - medical management as per medical team - PT, ambulation encouraged, DVT proph - discharge planning, poss rehab  All of the above findings and recommendations were discussed with the patient, patient's family, and medical physician, and all of patient's and herfamily's questions were answered to  their expressed satisfaction.  -- Marilynne Drivers Rosana Hoes, MD, [place]: [facility] - Partnering for exceptional care. Office: [phone]

## 2017-03-17 DIAGNOSIS — E43 Unspecified severe protein-calorie malnutrition: Secondary | ICD-10-CM

## 2017-03-17 DIAGNOSIS — K56609 Unspecified intestinal obstruction, unspecified as to partial versus complete obstruction: Secondary | ICD-10-CM

## 2017-03-17 LAB — CBC
HCT: 32.7 % — ABNORMAL LOW (ref 35.0–47.0)
Hemoglobin: 10.8 g/dL — ABNORMAL LOW (ref 12.0–16.0)
MCH: 29.3 pg (ref 26.0–34.0)
MCHC: 33.1 g/dL (ref 32.0–36.0)
MCV: 88.6 fL (ref 80.0–100.0)
PLATELETS: 202 10*3/uL (ref 150–440)
RBC: 3.69 MIL/uL — AB (ref 3.80–5.20)
RDW: 16.7 % — AB (ref 11.5–14.5)
WBC: 12.5 10*3/uL — AB (ref 3.6–11.0)

## 2017-03-17 LAB — GLUCOSE, CAPILLARY
GLUCOSE-CAPILLARY: 104 mg/dL — AB (ref 65–99)
GLUCOSE-CAPILLARY: 128 mg/dL — AB (ref 65–99)
GLUCOSE-CAPILLARY: 131 mg/dL — AB (ref 65–99)
Glucose-Capillary: 133 mg/dL — ABNORMAL HIGH (ref 65–99)
Glucose-Capillary: 109 mg/dL — ABNORMAL HIGH (ref 65–99)
Glucose-Capillary: 85 mg/dL (ref 65–99)

## 2017-03-17 LAB — PHOSPHORUS: Phosphorus: 3.1 mg/dL (ref 2.5–4.6)

## 2017-03-17 LAB — BASIC METABOLIC PANEL
ANION GAP: 7 (ref 5–15)
BUN: 56 mg/dL — AB (ref 6–20)
CALCIUM: 9.2 mg/dL (ref 8.9–10.3)
CO2: 25 mmol/L (ref 22–32)
Chloride: 110 mmol/L (ref 101–111)
Creatinine, Ser: 1.11 mg/dL — ABNORMAL HIGH (ref 0.44–1.00)
GFR calc Af Amer: 48 mL/min — ABNORMAL LOW (ref >60)
GFR, EST NON AFRICAN AMERICAN: 41 mL/min — AB (ref >60)
Glucose, Bld: 132 mg/dL — ABNORMAL HIGH (ref 65–99)
POTASSIUM: 4.4 mmol/L (ref 3.5–5.1)
SODIUM: 142 mmol/L (ref 135–145)

## 2017-03-17 LAB — MAGNESIUM: MAGNESIUM: 2.2 mg/dL (ref 1.7–2.4)

## 2017-03-17 MED ORDER — NITROGLYCERIN 2 % TD OINT: 1.0000 [in_us] | TOPICAL_OINTMENT | Freq: Four times a day (QID) | TRANSDERMAL | Status: DC | PRN

## 2017-03-17 MED ORDER — ENSURE ENLIVE PO LIQD
237.0000 mL | Freq: Two times a day (BID) | ORAL | Status: DC
  Administered 2017-03-17 – 2017-03-20 (×5): 237 mL via ORAL

## 2017-03-17 MED ORDER — INSULIN ASPART 100 UNIT/ML ~~LOC~~ SOLN: 0.0000 [IU] | Freq: Three times a day (TID) | SUBCUTANEOUS | Status: DC

## 2017-03-17 MED ORDER — CLINDAMYCIN PHOSPHATE 300 MG/50ML IV SOLN
300.0000 mg | Freq: Three times a day (TID) | INTRAVENOUS | Status: DC
Start: 2017-03-17 — End: 2017-03-19
  Administered 2017-03-17 – 2017-03-19 (×6): 300 mg via INTRAVENOUS
  Filled 2017-03-17 (×8): qty 50

## 2017-03-17 NOTE — Progress Notes (Signed)
PHARMACY - ADULT TOTAL PARENTERAL NUTRITION CONSULT NOTE   Pharmacy Consult for TPN ordering and monitoring  Indication: Severe malnutrition in context of chronic illness; prolonged Ileus   Patient Measurements:  Height: 4\' 11"  (149.9 cm) Weight: 107 lb 14.4 oz (48.9 kg) IBW/kg (Calculated) : 43.2 TPN AdjBW (KG): 51.3 Body mass index is 21.79 kg/m.   Assessment: 83 yo female with prolonged Ileus (SBO vs Ileus)   GI: LBM: 8/10 Endo: BGs: stable  Insulin requirements in the past 24 hours: 7 units  BMP Latest Ref Rng & Units 03/17/2017 03/16/2017 03/15/2017  Glucose 65 - 99 mg/dL 132(H) 116(H) 144(H)  BUN 6 - 20 mg/dL 56(H) 57(H) 59(H)  Creatinine 0.44 - 1.00 mg/dL 1.11(H) 1.15(H) 1.12(H)  BUN/Creat Ratio 12 - 28 - - -  Sodium 135 - 145 mmol/L 142 146(H) 142  Potassium 3.5 - 5.1 mmol/L 4.4 4.2 4.3  Chloride 101 - 111 mmol/L 110 113(H) 109  CO2 22 - 32 mmol/L 25 26 26   Calcium 8.9 - 10.3 mg/dL 9.2 9.3 9.2   Magnesium (mg/dL)  Date Value  03/17/2017 2.2  07/18/2014 2.5 (H)   Phosphorus (mg/dL)  Date Value  03/17/2017 3.1   Albumin (g/dL)  Date Value  03/15/2017 2.7 (L)  01/28/2017 4.2   Estimated Creatinine Clearance: 21.1 mL/min (A) (by C-G formula based on SCr of 1.11 mg/dL (H)).  CBG (last 3)   Recent Labs  03/16/17 2358 03/17/17 0428 03/17/17 0759  GLUCAP 131* 133* 128*      Best Practices: TPN Access:8/11 TPN start date:8/11  Nutritional Goals (per RD recommendation on 8/12):  KCal: 1200-1500kcal/day Protein: 67-77 g/day Fluid: >1.5L/day  Current Nutrition: TPN (Clinimix E 5/15) at 71ml/hr.  Plan:  Electrolytes and glucose are WNL. Plan per surgery is to cut TPN rate in half today and stop tonight. Will sign off. Thank you for the consult.   Ulice Dash D, Pharm.D., BCPS Clinical Pharmacist 03/17/2017 9:00 AM

## 2017-03-17 NOTE — Progress Notes (Signed)
SURGICAL PROGRESS NOTE (cpt [id number])  Hospital Day(s): 11.   Post op day(s): 5 Days Post-Op.   Interval History: Patient seen and examined, no acute events or new complaints overnight. Patient reports she feels "tired", but continues to pass flatus and a BM yesterday, currently denies any abdominal pain, N/V, fever/chills, CP, or SOB. She also continues to tolerate oral food and liquid, but continues to experience unchanged baseline dysphagia with liquids and solids.  Review of Systems:  Constitutional: denies fever, chills  HEENT: denies cough or congestion  Respiratory: denies any shortness of breath  Cardiovascular: denies chest pain or palpitations  Gastrointestinal: abdominal pain, N/V, and bowel function as per interval history Genitourinary: denies burning with urination or urinary frequency Musculoskeletal: denies pain, decreased motor or sensation Integumentary: denies any other rashes or skin discolorations Neurological: denies HA or vision/hearing changes   Vital signs in last 24 hours: [min-max] current  Temp:  [97.7 F (36.5 C)-98.5 F (36.9 C)] 98.5 F (36.9 C) (08/19 0427) Pulse Rate:  [84-93] 85 (08/19 0427) Resp:  [1-18] 18 (08/19 0427) BP: (125-157)/(58-91) 157/75 (08/19 0427) SpO2:  [95 %-99 %] 97 % (08/19 0427) Weight:  [107 lb 14.4 oz (48.9 kg)] 107 lb 14.4 oz (48.9 kg) (08/19 0103)     Height: 4\' 11"  (149.9 cm) Weight: 107 lb 14.4 oz (48.9 kg) BMI (Calculated): 22.9   Intake/Output this shift:  No intake/output data recorded.   Intake/Output last 2 shifts:  @IOLAST2SHIFTS @   Physical Exam:  Constitutional: alert, cooperative and no distress  HENT: normocephalic without obvious abnormality  Eyes: PERRL, EOM's grossly intact and symmetric  Neuro: CN II - XII grossly intact and symmetric without deficit  Respiratory: breathing non-labored at rest  Cardiovascular: regular rate and sinus rhythm  Gastrointestinal: soft, completely non-tender, and  non-distended  Musculoskeletal: UE and LE FROM, no edema or wounds, motor and sensation grossly intact, NT   Labs:  CBC Latest Ref Rng & Units 03/17/2017 03/16/2017 03/15/2017  WBC 3.6 - 11.0 K/uL 12.5(H) 12.9(H) 14.2(H)  Hemoglobin 12.0 - 16.0 g/dL 10.8(L) 11.0(L) 11.4(L)  Hematocrit 35.0 - 47.0 % 32.7(L) 33.5(L) 33.9(L)  Platelets 150 - 440 K/uL 202 211 216   CMP Latest Ref Rng & Units 03/17/2017 03/16/2017 03/15/2017  Glucose 65 - 99 mg/dL 132(H) 116(H) 144(H)  BUN 6 - 20 mg/dL 56(H) 57(H) 59(H)  Creatinine 0.44 - 1.00 mg/dL 1.11(H) 1.15(H) 1.12(H)  Sodium 135 - 145 mmol/L 142 146(H) 142  Potassium 3.5 - 5.1 mmol/L 4.4 4.2 4.3  Chloride 101 - 111 mmol/L 110 113(H) 109  CO2 22 - 32 mmol/L 25 26 26   Calcium 8.9 - 10.3 mg/dL 9.2 9.3 9.2  Total Protein 6.5 - 8.1 g/dL - - 6.9  Total Bilirubin 0.3 - 1.2 mg/dL - - 0.5  Alkaline Phos 38 - 126 U/L - - 97  AST 15 - 41 U/L - - 29  ALT 14 - 54 U/L - - 25   Imaging studies: No new pertinent imaging studies  Assessment/Plan:(ICD-10's: K56.52) 83 y.o.femalewith resolvingsmall bowel obstruction, attributable to post-surgical (hysterectomy) adhesions s/p removal of endoscopy-assisted placement of NG tube for nasogastric decompression with decreasing leukocytosis, complicated by resolved AKI and by pertinent comorbidities including HTN, HLD, CKD, GERD, and 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   - TPN 1/2'd, stop later today - PT, ambulation strongly encouraged - monitor abdominal exam and bowel function  - change glucose checks to ACHS after TPN stopped - swallow evaluation appreciated, diet changed to Dysphagia 1  -  if able to ambulate safely, patient/family prefer discharge to home  - if cannot walk even to bathroom at home, may need rehab - at baseline --> discharge planning, possibly tomorrow - medical management as per medical team    All of the above findings and recommendations were  discussed with the patient, patient's family, and patient's RN, and all of patient's and herfamily's questions were answered to their expressed satisfaction.  -- Marilynne Drivers Rosana Hoes, MD, [place]: [facility] - Partnering for exceptional care. Office: [phone]

## 2017-03-17 NOTE — Progress Notes (Signed)
[place] [facility] [place] NAME: Kimberly Abbott    MR#:  [id number]  DATE OF BIRTH:  [birth year]  SUBJECTIVE:  CHIEF COMPLAINT:   Chief Complaint  Patient presents with  . Abdominal Pain  . Emesis  The patient is 83 year old female with past medical history significant for history of hypertension, hyperlipidemia, CAD, gastroesophageal reflux disease, multiple abdominal surgeries, including appendectomy, rectal fistula surgery, tubal ligation, who presents to the hospital with complaints of nausea, vomiting, abdominal pain. CAT scan in the emergency room revealed partial small bowel obstruction. Patient continues to have some nausea,  But no vomiting, she is passing gas per rectum, but no stool since 03/10/2017. She has been followed by general surgery, managed with NG tube placement and suctioning, NGT was discontinued 03/15/2017, oral intake remains minimal, but no vomiting, no pain.   PatientIs admitted to the hospitalist service but changed to general surgery service 03/12/17 for persistent ileus/SBO   Patient complains of sore throat, abdominal pain, mostly on the right side of abdomen.no stool, passes gas, minimal oral intake, although recommended 50%. Today in the morning    Review of Systems  Constitutional: Negative for chills, fever and weight loss.  HENT: Positive for sore throat. Negative for congestion.   Eyes: Negative for blurred vision and double vision.  Respiratory: Negative for cough, sputum production, shortness of breath and wheezing.   Cardiovascular: Negative for chest pain, palpitations, orthopnea, leg swelling and PND.  Gastrointestinal: Positive for abdominal pain, constipation and nausea. Negative for blood in stool, diarrhea and vomiting.  Genitourinary: Negative for dysuria, frequency, hematuria and urgency.  Musculoskeletal: Negative for falls.  Neurological: Negative for dizziness, tremors, focal weakness and  headaches.  Endo/Heme/Allergies: Does not bruise/bleed easily.  Psychiatric/Behavioral: Negative for depression. The patient does not have insomnia.     VITAL SIGNS: Blood pressure (!) 155/76, pulse 84, temperature 98.1 F (36.7 C), temperature source Oral, resp. rate 19, height 4\' 11"  (1.499 m), weight 48.9 kg (107 lb 14.4 oz), SpO2 98 %.  PHYSICAL EXAMINATION:   GENERAL:  83 y.o.-year-old patient lying in the bed with no acute distress. The patient is hoarse EYES: Pupils equal, round, reactive to light and accommodation. No scleral icterus. Extraocular muscles intact.  HEENT: Head atraumatic, normocephalic. NECK:  Supple, no jugular venous distention. No thyroid enlargement, no tenderness.  LUNGS: Normal breath sounds bilaterally, no wheezing, rales,rhonchi or crepitation. No use of accessory muscles of respiration.  CARDIOVASCULAR: S1, S2 normal. No murmurs, rubs, or gallops.  ABDOMEN:   Some tenderness on the right side of the abdomen but no rebound or guarding, overactive bowel sounds , no  abdominal distention, soft, no guarding  EXTREMITIES: No pedal edema, cyanosis, or clubbing.  NEUROLOGIC: Cranial nerves II through XII are intact. Muscle strength  at has baseline Sensation intact. Gait not checked.  PSYCHIATRIC: The patient is alert and oriented x 3.  SKIN: No obvious rash, lesion, or ulcer.   ORDERS/RESULTS REVIEWED:   CBC  Recent Labs Lab 03/11/17 0401 03/14/17 0630 03/15/17 0454 03/16/17 0339 03/17/17 0357  WBC 20.3* 14.3* 14.2* 12.9* 12.5*  HGB 13.0 11.6* 11.4* 11.0* 10.8*  HCT 40.2 34.7* 33.9* 33.5* 32.7*  PLT 264 208 216 211 202  MCV 87.6 87.6 86.9 91.0 88.6  MCH 28.4 29.3 29.3 29.9 29.3  MCHC 32.4 33.5 33.7 32.9 33.1  RDW 16.2* 16.6* 16.6* 16.5* 16.7*   ------------------------------------------------------------------------------------------------------------------  Chemistries   Recent Labs Lab 03/11/17 0401 03/12/17 0317  03/14/17 0630  03/15/17 0454 03/15/17 1154 03/16/17 0339 03/17/17 0357  NA 144 140 142 142  --  146* 142  K 4.1 3.8 4.2 4.3  --  4.2 4.4  CL 112* 107 110 109  --  113* 110  CO2 25 26 25 26   --  26 25  GLUCOSE 163* 138* 116* 144*  --  116* 132*  BUN 39* 54* 62* 59*  --  57* 56*  CREATININE 1.12* 1.46* 1.24* 1.12*  --  1.15* 1.11*  CALCIUM 9.6 9.4 8.9 9.2  --  9.3 9.2  MG 2.1 2.1 2.3 2.3  --  2.3 2.2  AST 25  --   --   --  29  --   --   ALT 14  --   --   --  25  --   --   ALKPHOS 89  --   --   --  97  --   --   BILITOT 0.3  --   --   --  0.5  --   --    ------------------------------------------------------------------------------------------------------------------ estimated creatinine clearance is 21.1 mL/min (A) (by C-G formula based on SCr of 1.11 mg/dL (H)). ------------------------------------------------------------------------------------------------------------------ No results for input(s): TSH, T4TOTAL, T3FREE, THYROIDAB in the last 72 hours.  Invalid input(s): FREET3  Cardiac Enzymes No results for input(s): CKMB, TROPONINI, MYOGLOBIN in the last 168 hours.  Invalid input(s): CK ------------------------------------------------------------------------------------------------------------------ Invalid input(s): POCBNP ---------------------------------------------------------------------------------------------------------------  RADIOLOGY: No results found.  EKG:  Orders placed or performed during the hospital encounter of 09/05/16  . ED EKG  . ED EKG    ASSESSMENT AND PLAN:  Active Problems:   Partial small bowel obstruction (HCC)   Ileus (HCC)   Protein-calorie malnutrition, severe   Problems with swallowing and mastication   #  Acute renal injury, known CAD stage III, improved on IV fluid administration Monitor renal function and avoid nephrotoxins Creatinine 1.7-1.65-1.33 -1.45 --1.46- 1.24-1.11, improved to baseline  #. Hypokalemia resolved Repleted and  rechecking PRN  #. Hypertension- blood pressure  is poorly controlled, continue current therapy for now with metoprolol intravenously, nitroglycerin topically, advance nitroglycerin to  1 inch every 6 hours   #. Anemia, hemoglobin level is stable today, no active bleeding noted.  #  small bowel obstruction    Management per surgery  Clinically better, with some flatus Status post EGD and NG tube placement 03/12/17, NG tube was discontinued  03/15/2017, discussed with Dr. Rosana Hoes, diet has been advanced,, continue TPN Monitor intake and output. Patient has nausea and right upper abdominal pain, getting right upper quadrant ultrasound to rule out gallbladder inflammation/distention     # . Generalized weakness, PT recommends HHPT  # Sore throat, continue patient on Cepacol lozenges, improved hoarseness  # Leukocytosis, seems to be better than before, however, repeated chest x-ray revealed worsening aspiration pneumonitis. Patient continues to expectorate thick, clear to yellowish phlegm, but no fevers are reported as over the past 3 days. Due to worsening lung consolidation, patient will be initiated on clindamycin intravenously, get sputum cultures if possible  DRUG ALLERGIES:  Allergies  Allergen Reactions  . Penicillin G Hives  . Penicillins Hives    Has patient had a PCN reaction causing immediate rash, facial/tongue/throat swelling, SOB or lightheadedness with hypotension: No Has patient had a PCN reaction causing severe rash involving mucus membranes or skin necrosis: No Has patient had a PCN reaction that required hospitalization No Has patient had a PCN reaction occurring within the last 10  years: No If all of the above answers are "NO", then may proceed with Cephalosporin use.    CODE STATUS:     Code Status Orders        Start     Ordered   03/06/17 2034  Full code  Continuous     03/06/17 2033    Code Status History    Date Active Date Inactive Code Status Order ID  Comments User Context   06/08/2016 11:37 AM 06/10/2016  3:53 PM Full Code [id number]  Fritzi Mandes, MD Inpatient    Advance Directive Documentation     Most Recent Value  Type of Advance Directive  Healthcare Power of [place], Living will  Pre-existing out of facility DNR order (yellow form or pink MOST form)  -  "MOST" Form in Place?  -      TOTAL TIME TAKING CARE OF THIS PATIENT: 35 minutes.    Theodoro Grist M.D on 03/17/2017 at 1:19 PM  Between 7am to 6pm - Pager - [phone]  After 6pm go to [email or web address] - password [email or web address] [facility]  Office  [phone]  CC: Primary care physician; Adline Potter, MD

## 2017-03-17 NOTE — Progress Notes (Signed)
Nutrition Follow-up  DOCUMENTATION CODES:   Severe malnutrition in context of chronic illness  INTERVENTION:  Agree with plan to reduce TPN to half rate. Rate already reduced to Clinimix E 5/15 at 25 ml/hr. Okay to discontinue when current bag runs out.  Will discontinue Boost Breeze.  Provide Ensure Enlive po BID, each supplement provides 350 kcal and 20 grams of protein. Patient prefers vanilla.  Will update [facility] to reflect that patient prefers vanilla Magic Cup when provided on dysphagia 1 trays (290 kcal, 9 grams of protein per container).  If plan is for patient to discharge home on pureed diet, she and family members would benefit from education on how to prepare this diet at home.  NUTRITION DIAGNOSIS:   Malnutrition related to poor appetite (advanced age) as evidenced by severe depletion of muscle mass, severe depletion of body fat.  Ongoing.  GOAL:   Patient will meet greater than or equal to 90% of their needs  Progressing.  MONITOR:   Diet advancement, Labs, Weight trends, I & O's, Other (Comment) (TPN)  REASON FOR ASSESSMENT:   Consult New TPN/TNA  ASSESSMENT:   83 year old female with past medical history significant for history of hypertension, hyperlipidemia, CAD, gastroesophageal reflux disease, multiple abdominal surgeries, including appendectomy, rectal fistula surgery, tubal ligation, who presents to the hospital with complaints of nausea, vomiting, abdominal pain. CAT scan in the emergency room revealed partial small bowel obstruction.  -Patient was advanced to soft diet on 8/18. Downgraded to dysphagia 1 diet (pureed) with thin liquids this morning.   Spoke with patient and family members at bedside. Patient very quiet, but able to provide some short responses. She reports she did not do very well with breakfast this morning or dinner last night. She would like the vanilla Magic Cup better than the orange flavor that came on tray this morning  [facility] Cup often provided on dysphagia diet trays to provide enough calories). Each container of Magic Cup provides 290 kcal and 9 grams of protein. Patient reports she has never been on a pureed diet before. Patient denies any nausea. She is having pain on her right side, which family believes may be gas pains since she has not been up to walk yet. She had a bowel movement yesterday.  Meal Completion: 0-85%; patient finished 85% of lunch yesterday, 0% of dinner last night, and only bites/sips of breakfast this morning (two bites of pureed eggs, bites of orange Magic Cup, sips of milk); patient did have one Boost Breeze yesterday In the past 24 hrs patient has had approximately 804 kcal (67% minimum estimated kcal needs) and 21 grams of protein (35% minimum estimated protein needs) including Boost Breeze.  Access: PICC triple lumen placed 03/09/2017  TPN: pt previously tolerating Clinimix E 5/15 at 55 ml/hr + 20% ILE at 15 ml/hr over 12 hrs. Order has already been decreased to Clinimix E 5/15 at 25 ml/hr (lipids already ran 12 hrs).  Medications reviewed and include: Novolog 0-9 units Q4hrs (4 units past 24 hrs), pantoprazole, Maalox/Mylanta PRN for indigestion/heartburn/flatulence.  Labs reviewed: CBG 82-133 past 24 hrs, BUN 56 (trending down from 62 on 8/16), Creatinine 1.11 (trending down).  I/O: 3 occurrences of unmeasured UOP past 24 hrs; no other output in chart  Weight trend: 48.9 kg on 8/19 (-2.4 kg from admission)  Discussed nutrition plan of care with Dr. Rosana Hoes. Plan is to reduce rate of current bag hanging to half rate. Will discontinue TPN when current bag runs out tonight.  Discharge planning for home once patient is able to walk.  Diet Order:  .TPN (CLINIMIX-E) Adult DIET - DYS 1 Room service appropriate? Yes; Fluid consistency: Thin  Skin:  Reviewed, no issues  Last BM:  03/16/2017 - per patient report, but did not see documented in chart  Height:   Ht Readings from Last 1  Encounters:  03/06/17 4\' 11"  (1.499 m)    Weight:   Wt Readings from Last 1 Encounters:  03/17/17 107 lb 14.4 oz (48.9 kg)    Ideal Body Weight:  44.5 kg  BMI:  Body mass index is 21.79 kg/m.  Estimated Nutritional Needs:   Kcal:  1200-1500 (25-30 kcal/kg)  Protein:  60-73 grams (1.2-1.5 grams/kg)  Fluid:  1.2 L/day (25 ml/kg)  EDUCATION NEEDS:   No education needs identified at this time  Willey Blade, MS, RD, LDN Pager: [phone] After Hours Pager: [phone]

## 2017-03-18 ENCOUNTER — Inpatient Hospital Stay: Payer: Medicare HMO

## 2017-03-18 LAB — GLUCOSE, CAPILLARY
GLUCOSE-CAPILLARY: 88 mg/dL (ref 65–99)
Glucose-Capillary: 99 mg/dL (ref 65–99)
Glucose-Capillary: 89 mg/dL (ref 65–99)
Glucose-Capillary: 107 mg/dL — ABNORMAL HIGH (ref 65–99)

## 2017-03-18 LAB — BASIC METABOLIC PANEL
Anion gap: 5 (ref 5–15)
BUN: 51 mg/dL — AB (ref 6–20)
CHLORIDE: 108 mmol/L (ref 101–111)
CO2: 26 mmol/L (ref 22–32)
CREATININE: 1.24 mg/dL — AB (ref 0.44–1.00)
Calcium: 9.2 mg/dL (ref 8.9–10.3)
GFR calc Af Amer: 42 mL/min — ABNORMAL LOW (ref >60)
GFR calc non Af Amer: 36 mL/min — ABNORMAL LOW (ref >60)
Glucose, Bld: 90 mg/dL (ref 65–99)
POTASSIUM: 4.9 mmol/L (ref 3.5–5.1)
SODIUM: 139 mmol/L (ref 135–145)

## 2017-03-18 LAB — CBC
HEMATOCRIT: 32.3 % — AB (ref 35.0–47.0)
HEMOGLOBIN: 10.8 g/dL — AB (ref 12.0–16.0)
MCH: 29.6 pg (ref 26.0–34.0)
MCHC: 33.6 g/dL (ref 32.0–36.0)
MCV: 88.1 fL (ref 80.0–100.0)
Platelets: 229 10*3/uL (ref 150–440)
RBC: 3.67 MIL/uL — AB (ref 3.80–5.20)
RDW: 16.1 % — ABNORMAL HIGH (ref 11.5–14.5)
WBC: 12 10*3/uL — ABNORMAL HIGH (ref 3.6–11.0)

## 2017-03-18 LAB — EXPECTORATED SPUTUM ASSESSMENT W GRAM STAIN, RFLX TO RESP C

## 2017-03-18 LAB — TRIGLYCERIDES: Triglycerides: 79 mg/dL (ref <150)

## 2017-03-18 LAB — HEPATIC FUNCTION PANEL
ALBUMIN: 2.4 g/dL — AB (ref 3.5–5.0)
ALT: 44 U/L (ref 14–54)
AST: 24 U/L (ref 15–41)
Alkaline Phosphatase: 90 U/L (ref 38–126)
Total Bilirubin: 0.3 mg/dL (ref 0.3–1.2)
Total Protein: 5.9 g/dL — ABNORMAL LOW (ref 6.5–8.1)

## 2017-03-18 LAB — MAGNESIUM: MAGNESIUM: 2.1 mg/dL (ref 1.7–2.4)

## 2017-03-18 LAB — PHOSPHORUS: PHOSPHORUS: 3.5 mg/dL (ref 2.5–4.6)

## 2017-03-18 MED ORDER — DEXTROSE IN LACTATED RINGERS 5 % IV SOLN
INTRAVENOUS | Status: DC
  Administered 2017-03-18 – 2017-03-20 (×3): via INTRAVENOUS

## 2017-03-18 NOTE — Progress Notes (Signed)
CC: Small bowel obstruction Subjective: Patient reports that she thinks she is feeling much better. She denies any fevers, chills, pain. She has been evaluated by speech pathology due to continued difficulties with swallowing. She appears to be having bowel function.  Objective: Vital signs in last 24 hours: Temp:  [97.5 F (36.4 C)-97.6 F (36.4 C)] 97.6 F (36.4 C) (08/20 1254) Pulse Rate:  [80-83] 81 (08/20 1254) Resp:  [18-20] 18 (08/20 0423) BP: (122-142)/(65-76) 122/71 (08/20 1254) SpO2:  [97 %-98 %] 97 % (08/20 1254) Weight:  [49.5 kg (109 lb 3.2 oz)] 49.5 kg (109 lb 3.2 oz) (08/20 0500) Last BM Date: 03/16/17  Intake/Output from previous day: 08/19 0701 - 08/20 0700 In: 592.8 [I.V.:492.8; IV Piggyback:100] Out: -  Intake/Output this shift: No intake/output data recorded.  Physical exam:  Gen.: No acute distress Chest: Clear to auscultation Heart: Regular rate and rhythm Abdomen: Soft, nontender, nondistended  Lab Results: CBC   Recent Labs  03/17/17 0357 03/18/17 0440  WBC 12.5* 12.0*  HGB 10.8* 10.8*  HCT 32.7* 32.3*  PLT 202 229   BMET  Recent Labs  03/17/17 0357 03/18/17 0440  NA 142 139  K 4.4 4.9  CL 110 108  CO2 25 26  GLUCOSE 132* 90  BUN 56* 51*  CREATININE 1.11* 1.24*  CALCIUM 9.2 9.2   PT/INR No results for input(s): LABPROT, INR in the last 72 hours. ABG No results for input(s): PHART, HCO3 in the last 72 hours.  Invalid input(s): PCO2, PO2  Studies/Results: US Abdomen Limited Ruq  Result Date: 03/18/2017 CLINICAL DATA:  Right upper quadrant pain. EXAM: ULTRASOUND ABDOMEN LIMITED RIGHT UPPER QUADRANT COMPARISON:  None. FINDINGS: Gallbladder: No gallstones or wall thickening visualized. No sonographic Murphy sign noted by sonographer. Common bile duct: Diameter: 8.8 mm, within normal limits for age. Liver: No focal lesion identified. Within normal limits in parenchymal echogenicity. Portal vein is patent on color Doppler imaging  with normal direction of blood flow towards the liver. IMPRESSION: No significant abnormalities. Electronically Signed   By: Lorriane Shire M.D.   On: 03/18/2017 09:16    Anti-infectives: Anti-infectives    Start     Dose/Rate Route Frequency Ordered Stop   03/17/17 1400  clindamycin (CLEOCIN) IVPB 300 mg     300 mg 100 mL/hr over 30 Minutes Intravenous Every 8 hours 03/17/17 0947        Assessment/Plan:  83 year old female admitted with a small bowel obstruction that appears to have resolved. Now having questionable esophageal problems versus swallowing difficulties. This also be ordered per rectum relation speech path. Discussed the case with on-call GI, we will hold off on formal consultation until after barium swallow. If barium swallow shows no actual pathology then there'll be no need for a GI consultation. Encourage ambulation, incentive spirometer usage, oral intake. Pending results from swallow study. We need to have a conversation with patient and family about treatment goals and whether not she would except a feeding tube should it be indicated.  Deshonda Cryderman T. Adonis Huguenin, MD, [facility] General Surgeon [facility]  Day [facility] [phone] Night [facility] [phone] 03/18/2017

## 2017-03-18 NOTE — Care Management (Signed)
Reched out to attending to discuss possible benefits of palliative consult for goals of care and code status.  Anticipate discharge home with home health

## 2017-03-18 NOTE — Progress Notes (Signed)
[facility] NAME: Kimberly Abbott    MR#:  [id number]  DATE OF BIRTH:  February 15, 1921  SUBJECTIVE:  CHIEF COMPLAINT:   Chief Complaint  Patient presents with  . Abdominal Pain  . Emesis  The patient is 83 year old female with past medical history significant for history of hypertension, hyperlipidemia, CAD, gastroesophageal reflux disease, multiple abdominal surgeries, including appendectomy, rectal fistula surgery, tubal ligation, who presents to the hospital with complaints of nausea, vomiting, abdominal pain. CAT scan in the emergency room revealed partial small bowel obstruction. Patient continues to have some nausea,  But no vomiting, she is passing gas per rectum, but no stool since 03/10/2017. She has been followed by general surgery, managed with NG tube placement and suctioning, NGT was discontinued 03/15/2017, oral intake remains minimal, Patient complains of regurgitation, feeling like food is stopping at the throat.   PatientIs admitted to the hospitalist service but changed to general surgery service 03/12/17 for persistent ileus/SBO   Patient complains of sore throat, abdominal pain, mostly on the right side of abdomen.no stool, passes gas, minimal oral intake, although recommended 50%. Today in the morning    Review of Systems  Constitutional: Negative for chills, fever and weight loss.  HENT: Positive for sore throat. Negative for congestion.   Eyes: Negative for blurred vision and double vision.  Respiratory: Negative for cough, sputum production, shortness of breath and wheezing.   Cardiovascular: Negative for chest pain, palpitations, orthopnea, leg swelling and PND.  Gastrointestinal: Positive for abdominal pain, constipation and nausea. Negative for blood in stool, diarrhea and vomiting.  Genitourinary: Negative for dysuria, frequency, hematuria and urgency.  Musculoskeletal: Negative for falls.  Neurological:  Negative for dizziness, tremors, focal weakness and headaches.  Endo/Heme/Allergies: Does not bruise/bleed easily.  Psychiatric/Behavioral: Negative for depression. The patient does not have insomnia.     VITAL SIGNS: Blood pressure 122/71, pulse 81, temperature 97.6 F (36.4 C), temperature source Oral, resp. rate 18, height 4\' 11"  (1.499 m), weight 49.5 kg (109 lb 3.2 oz), SpO2 97 %.  PHYSICAL EXAMINATION:   GENERAL:  83 y.o.-year-old patient  sittingin the bed with no acute distress. The patient is hoarse EYES: Pupils equal, round, reactive to light and accommodation. No scleral icterus. Extraocular muscles intact.  HEENT: Head atraumatic, normocephalic. NECK:  Supple, no jugular venous distention. No thyroid enlargement, no tenderness.  LUNGS: diminished breath sounds bilaterally, no wheezing, few scattered basilar rales,rhonchi and crepitations . Intermittent use of accessory muscles of respiration, with movements or speech.  CARDIOVASCULAR: S1, S2 normal. No murmurs, rubs, or gallops.  ABDOMEN:   No significant tenderness on the right side of the abdomen on palpation today, no  abdominal distention, soft, no guarding  EXTREMITIES: No pedal edema, cyanosis, or clubbing.  NEUROLOGIC: Cranial nerves II through XII are intact. Muscle strength  at has baseline Sensation intact. Gait not checked.  PSYCHIATRIC: The patient is alert and oriented x 3.  SKIN: No obvious rash, lesion, or ulcer.   ORDERS/RESULTS REVIEWED:   CBC  Recent Labs Lab 03/14/17 0630 03/15/17 0454 03/16/17 0339 03/17/17 0357 03/18/17 0440  WBC 14.3* 14.2* 12.9* 12.5* 12.0*  HGB 11.6* 11.4* 11.0* 10.8* 10.8*  HCT 34.7* 33.9* 33.5* 32.7* 32.3*  PLT 208 216 211 202 229  MCV 87.6 86.9 91.0 88.6 88.1  MCH 29.3 29.3 29.9 29.3 29.6  MCHC 33.5 33.7 32.9 33.1 33.6  RDW 16.6* 16.6* 16.5* 16.7* 16.1*    ------------------------------------------------------------------------------------------------------------------  Chemistries   Recent Labs Lab 03/14/17 0630 03/15/17 0454 03/15/17 1154 03/16/17 0339 03/17/17 0357 03/18/17 0440  NA 142 142  --  146* 142 139  K 4.2 4.3  --  4.2 4.4 4.9  CL 110 109  --  113* 110 108  CO2 25 26  --  26 25 26   GLUCOSE 116* 144*  --  116* 132* 90  BUN 62* 59*  --  57* 56* 51*  CREATININE 1.24* 1.12*  --  1.15* 1.11* 1.24*  CALCIUM 8.9 9.2  --  9.3 9.2 9.2  MG 2.3 2.3  --  2.3 2.2 2.1  AST  --   --  29  --   --  24  ALT  --   --  25  --   --  44  ALKPHOS  --   --  97  --   --  90  BILITOT  --   --  0.5  --   --  0.3   ------------------------------------------------------------------------------------------------------------------ estimated creatinine clearance is 18.9 mL/min (A) (by C-G formula based on SCr of 1.24 mg/dL (H)). ------------------------------------------------------------------------------------------------------------------ No results for input(s): TSH, T4TOTAL, T3FREE, THYROIDAB in the last 72 hours.  Invalid input(s): FREET3  Cardiac Enzymes No results for input(s): CKMB, TROPONINI, MYOGLOBIN in the last 168 hours.  Invalid input(s): CK ------------------------------------------------------------------------------------------------------------------ Invalid input(s): POCBNP ---------------------------------------------------------------------------------------------------------------  RADIOLOGY: US Abdomen Limited Ruq  Result Date: 03/18/2017 CLINICAL DATA:  Right upper quadrant pain. EXAM: ULTRASOUND ABDOMEN LIMITED RIGHT UPPER QUADRANT COMPARISON:  None. FINDINGS: Gallbladder: No gallstones or wall thickening visualized. No sonographic Murphy sign noted by sonographer. Common bile duct: Diameter: 8.8 mm, within normal limits for age. Liver: No focal lesion identified. Within normal limits in parenchymal echogenicity.  Portal vein is patent on color Doppler imaging with normal direction of blood flow towards the liver. IMPRESSION: No significant abnormalities. Electronically Signed   By: Lorriane Shire M.D.   On: 03/18/2017 09:16    EKG:  Orders placed or performed during the hospital encounter of 09/05/16  . ED EKG  . ED EKG    ASSESSMENT AND PLAN:  Active Problems:   Partial small bowel obstruction (HCC)   Ileus (HCC)   Protein-calorie malnutrition, severe   Problems with swallowing and mastication   #  Acute renal injury, known CAD stage III, improved on IV fluid administration Monitor renal function and avoid nephrotoxins Creatinine 1.7-1.65-1.33 -1.45 --1.46- 1.24-1.11-1.24, improved to baseline  #. Hypokalemia resolved Repleted and rechecking PRN  #. Hypertension- blood pressure  is better controlled, continue current therapy for now with metoprolol intravenously, nitroglycerin topically, now on  advanced nitroglycerin at 1 inch every 6 hours.   #. Anemia, hemoglobin level is stable today, no active bleeding noted.  #  small bowel obstruction    Management per surgery  Clinically better, with some flatus Status post EGD and NG tube placement 03/12/17, NG tube was discontinued  03/15/2017, discussed with Dr. Adonis Huguenin today, diet has been advanced,, continue TPN. The patient admits of dysphagia and has for the regurgitation, she feels that her food stops at upper sternal/throat. Order modified barium swallowing study, discussed with speech therapist.      # . Generalized weakness, PT recommends HHPT  # Sore throat, continue patient on Cepacol lozenges, improved hoarseness  # Leukocytosis, seems to be better than before,   # aspiration pneumonitis,  repeated chest x-ray revealed worsening consolidation. , continue  clindamycin intravenously, awaiting for sputum culture results. Modified Barium swallow study is  ordered     DRUG ALLERGIES:  Allergies  Allergen Reactions  .  Penicillin G Hives  . Penicillins Hives    Has patient had a PCN reaction causing immediate rash, facial/tongue/throat swelling, SOB or lightheadedness with hypotension: No Has patient had a PCN reaction causing severe rash involving mucus membranes or skin necrosis: No Has patient had a PCN reaction that required hospitalization No Has patient had a PCN reaction occurring within the last 10 years: No If all of the above answers are "NO", then may proceed with Cephalosporin use.    CODE STATUS:     Code Status Orders        Start     Ordered   03/06/17 2034  Full code  Continuous     03/06/17 2033    Code Status History    Date Active Date Inactive Code Status Order ID Comments User Context   06/08/2016 11:37 AM 06/10/2016  3:53 PM Full Code [id number]  Fritzi Mandes, MD Inpatient    Advance Directive Documentation     Most Recent Value  Type of Advance Directive  Healthcare Power of [place], Living will  Pre-existing out of facility DNR order (yellow form or pink MOST form)  -  "MOST" Form in Place?  -      TOTAL TIME TAKING CARE OF THIS PATIENT: 35 minutes.    Theodoro Grist M.D on 03/18/2017 at 1:14 PM  Between 7am to 6pm - Pager - [phone]  After 6pm go to [email or web address] - password [email or web address] [facility]  Office  [phone]  CC: Primary care physician; Adline Potter, MD

## 2017-03-18 NOTE — Progress Notes (Addendum)
SLP Contact Note  Patient Details Name: Kimberly Abbott MRN: [id number] DOB: [birth year]   SLP received call from unit regarding pt swallow status. Pt underwent bedside swallow evaluation Saturday 03/16/17, which identified moderate aspiration risk, largely due to GI and esophageal issues. Pt has not had Modified Barium Swallow evaluation per chart review, however, Regular Barium Swallow study was completed 02/26/14, which revealed normal pharyngeal anatomy and motility, residue in the hypopharynx, mild penetration and aspiration, prominent Cricopharyngeus, esophageal mass, narrow proximal esophagus and tertiary contractions.    Based on this information, pt primary issue appears to be esophageal, but with pharyngeal component. SLP completing BSE on 03/16/17 recommended Dys 1/thin liquids, crushed meds until GI work up completed. Pt now reported to have difficulty with po intake. Consider NPO status until esophageal work up. SLP discussed pt with MD. Order placed for regular barium swallow (esophagram) to evaluate esophageal (dys)motility. ST to continue to follow.    Kimberly Abbott B. Quentin Ore [facility], CCC-SLP Speech Pathologist [phone]  Shonna Chock 03/18/2017, 11:15 AM

## 2017-03-18 NOTE — Progress Notes (Signed)
Chaplain made a follow up visit with pt. Pt was lying on the bed at the time of this visit. Pt's son and daughter-in-law were at bedside. Son states pt was doing much better this morning and eat ood which stayed down. Pt told [place] that she is hoping to go home soon. Pt asked [place] to prayer for her to get better, which [place] did. CH to follow up this pt as needed.   03/18/17 1600  Clinical Encounter Type  Visited With Patient and family together  Visit Type Follow-up;Spiritual support  Referral From [place]

## 2017-03-18 NOTE — Progress Notes (Signed)
Nutrition Follow-up  DOCUMENTATION CODES:   Severe malnutrition in context of chronic illness  INTERVENTION:  1. Monitor for diet advancement following MBSS - provide ONS as needed  NUTRITION DIAGNOSIS:   Malnutrition related to poor appetite (advanced age) as evidenced by severe depletion of muscle mass, severe depletion of body fat. -ongoing  GOAL:   Patient will meet greater than or equal to 90% of their needs -not meeting  MONITOR:   Diet advancement, Labs, Weight trends, I & O's, Other (Comment) (TPN)  ASSESSMENT:   83 year old female with past medical history significant for history of hypertension, hyperlipidemia, CAD, gastroesophageal reflux disease, multiple abdominal surgeries, including appendectomy, rectal fistula surgery, tubal ligation, who presents to the hospital with complaints of nausea, vomiting, abdominal pain. CAT scan in the emergency room revealed partial small bowel obstruction.  Patient off TPN now, SLP follow-up pending. She did not consume any of her tray this morning.  Was originally recommended NDD1 - thin liquids by SLP, was changed to honey thick due to regurgitating liquids.  Having trouble swallowing much of anything. MBSS ordered for today. Recommended NPO by SLP now.  Weight down 4 pounds since admission.  Intake/Output Summary (Last 24 hours) at 03/18/17 1236 Last data filed at 03/17/17 2156  Gross per 24 hour  Intake           592.75 ml  Output                0 ml  Net           592.75 ml  10L Fluid Positive   Access: L Triple Lumen PICC  Labs reviewed:  BUN/Creatinine 51/1.24  Medications reviewed and include:  Novolog 0-9 Units TID and HS   Diet Order:  DIET - DYS 1 Room service appropriate? Yes; Fluid consistency: Honey Thick  Skin:  Reviewed, no issues  Last BM:  03/16/2017 (type 6)  Height:   Ht Readings from Last 1 Encounters:  03/06/17 4\' 11"  (1.499 m)    Weight:   Wt Readings from Last 1 Encounters:  03/18/17  109 lb 3.2 oz (49.5 kg)    Ideal Body Weight:  44.5 kg  BMI:  Body mass index is 22.06 kg/m.  Estimated Nutritional Needs:   Kcal:  1200-1500 (25-30 kcal/kg)  Protein:  60-73 grams (1.2-1.5 grams/kg)  Fluid:  1.2 L/day (25 ml/kg)  EDUCATION NEEDS:   No education needs identified at this time  [name]. Doniel Maiello, MS, RD LDN Inpatient Clinical Dietitian Pager [phone]

## 2017-03-18 NOTE — Progress Notes (Signed)
Physical Therapy Treatment Patient Details Name: Kimberly Abbott MRN: [id number] DOB: 03/11/1921 Today's Date: 03/18/2017    History of Present Illness Pt is 83 year old female with past medical history significant for history of hypertension, hyperlipidemia, CAD, gastroesophageal reflux disease, multiple abdominal surgeries, including appendectomy, rectal fistula surgery, tubal ligation, who presents to the hospital with complaints of nausea, vomiting, abdominal pain. CAT scan in the emergency room revealed partial small bowel obstruction. Assessment includes: Partial small bowel obstruction versus ileus, Acute renal injury, known CAD stage III, hyperkalemia, hyponatremia, and anemia.    PT Comments    Pt reports severe dizziness in attempt to perform supine to sit (BP went from 129/60 mmHg to 109/65 mmHg); pt able to perform bed exercises during session. Pt denies pain during session. Pt appears lethargic and fatigued during session. Next session focus on increasing activity tolerance and attempting ambulation if blood pressure allows. Social work notified of updated discharge recommendation.   Follow Up Recommendations  SNF     Equipment Recommendations  None recommended by PT    Recommendations for Other Services       Precautions / Restrictions Precautions Precautions: Fall Restrictions Weight Bearing Restrictions: No    Mobility  Bed Mobility Overal bed mobility: Needs Assistance Bed Mobility: Rolling Rolling: Modified independent (Device/Increase time)         General bed mobility comments: in attempt to perform supine to sit pt reports severe dizziness and a drop in BP (129/60 mmHg to 109/65 mmHg); pt returned to supine for bed exercises  Transfers                 General transfer comment: deferred due to significant drop in BP  Ambulation/Gait             General Gait Details: deferred due to significant drop in BP   Stairs             Wheelchair Mobility    Modified Rankin (Stroke Patients Only)       Balance       Sitting balance - Comments: deferred due to significant drop in BP       Standing balance comment: deferred due to significant drop in BP                            Cognition Arousal/Alertness: Lethargic Behavior During Therapy: WFL for tasks assessed/performed Overall Cognitive Status: Difficult to assess                                 General Comments: pt very lethargic/fatigued       Exercises Total Joint Exercises Ankle Circles/Pumps: AROM;Both;10 reps Quad Sets: Strengthening;Both;10 reps Gluteal Sets: Strengthening;Both;10 reps Heel Slides: AROM;Both;10 reps Hip ABduction/ADduction: AROM;Both;10 reps    General Comments        Pertinent Vitals/Pain Pain Assessment: No/denies pain    Home Living                      Prior Function            PT Goals (current goals can now be found in the care plan section) Acute Rehab PT Goals Patient Stated Goal: Improved endurance PT Goal Formulation: With patient Time For Goal Achievement: 03/21/17 Potential to Achieve Goals: Fair Progress towards PT goals: Not progressing toward goals - comment    Frequency  Min 2X/week      PT Plan Discharge plan needs to be updated    Co-evaluation              AM-PAC PT "6 Clicks" Daily Activity  Outcome Measure  Difficulty turning over in bed (including adjusting bedclothes, sheets and blankets)?: A Little Difficulty moving from lying on back to sitting on the side of the bed? : Unable Difficulty sitting down on and standing up from a chair with arms (e.g., wheelchair, bedside commode, etc,.)?: Unable Help needed moving to and from a bed to chair (including a wheelchair)?: A Lot Help needed walking in hospital room?: Total Help needed climbing 3-5 steps with a railing? : Total 6 Click Score: 9    End of Session Equipment Utilized  During Treatment: Gait belt Activity Tolerance: Patient limited by fatigue;Patient limited by lethargy;Treatment limited secondary to medical complications (Comment) (significant decrease in BP) Patient left: in bed;with call bell/phone within reach;with bed alarm set;with family/visitor present Nurse Communication: Mobility status;Other (comment) (significant drop in BP) PT Visit Diagnosis: Muscle weakness (generalized) (M62.81)     Time: 1415-1430 PT Time Calculation (min) (ACUTE ONLY): 15 min  Charges:                       G CodesLaqueta Carina, SPT 04-08-2017,4:24 PM [phone]

## 2017-03-19 LAB — GLUCOSE, CAPILLARY
GLUCOSE-CAPILLARY: 101 mg/dL — AB (ref 65–99)
GLUCOSE-CAPILLARY: 113 mg/dL — AB (ref 65–99)
GLUCOSE-CAPILLARY: 90 mg/dL (ref 65–99)
GLUCOSE-CAPILLARY: 103 mg/dL — AB (ref 65–99)

## 2017-03-19 MED ORDER — PANTOPRAZOLE SODIUM 40 MG PO PACK
40.0000 mg | PACK | Freq: Two times a day (BID) | ORAL | Status: DC
  Administered 2017-03-19 – 2017-03-20 (×2): 40 mg via ORAL
  Filled 2017-03-19 (×3): qty 20

## 2017-03-19 MED ORDER — CLINDAMYCIN PALMITATE HCL 75 MG/5ML PO SOLR
300.0000 mg | Freq: Three times a day (TID) | ORAL | Status: DC
  Administered 2017-03-19 – 2017-03-20 (×3): 300 mg via ORAL
  Filled 2017-03-19 (×5): qty 20

## 2017-03-19 MED ORDER — SODIUM CHLORIDE 0.9 % IV SOLN: 250.0000 mL | INTRAVENOUS | Status: DC | PRN

## 2017-03-19 NOTE — Progress Notes (Signed)
[facility] NAME: Kimberly Abbott    MR#:  [id number]  DATE OF BIRTH:  Oct 18, 1920  SUBJECTIVE:  CHIEF COMPLAINT:   Chief Complaint  Patient presents with  . Abdominal Pain  . Emesis  The patient is 83 year old female with past medical history significant for history of hypertension, hyperlipidemia, CAD, gastroesophageal reflux disease, multiple abdominal surgeries, including appendectomy, rectal fistula surgery, tubal ligation, who presents to the hospital with complaints of nausea, vomiting, abdominal pain. CAT scan in the emergency room revealed partial small bowel obstruction. Patient continues to have some nausea,  But no vomiting, she is passing gas per rectum, but no stool since 03/10/2017. She has been followed by general surgery, managed with NG tube placement and suctioning, NGT was discontinued 03/15/2017, oral intake remains minimal, Patient complains of regurgitation, feeling like food is stopping at the throat.   PatientIs admitted to the hospitalist service but changed to general surgery service 03/12/17 for persistent ileus/SBO   Patient Feels better today after she had small bowel movement, able to eat some food, barium swallowing study, however, revealed narrowing esophagus, diverticulum in upper esophagus, doesn't neurologist felt that no stretching can be done, but supportive therapy. Patient is on dysphagia 2 diet, which is recommended to be continued at home. Discussed with Dr. Adonis Huguenin, possible discharge home tonight if the patient does well with lunch today   Review of Systems  Constitutional: Negative for chills, fever and weight loss.  HENT: Positive for sore throat. Negative for congestion.   Eyes: Negative for blurred vision and double vision.  Respiratory: Negative for cough, sputum production, shortness of breath and wheezing.   Cardiovascular: Negative for chest pain, palpitations, orthopnea, leg  swelling and PND.  Gastrointestinal: Positive for abdominal pain, constipation and nausea. Negative for blood in stool, diarrhea and vomiting.  Genitourinary: Negative for dysuria, frequency, hematuria and urgency.  Musculoskeletal: Negative for falls.  Neurological: Negative for dizziness, tremors, focal weakness and headaches.  Endo/Heme/Allergies: Does not bruise/bleed easily.  Psychiatric/Behavioral: Negative for depression. The patient does not have insomnia.     VITAL SIGNS: Blood pressure 116/62, pulse 79, temperature (!) 97.5 F (36.4 C), temperature source Oral, resp. rate 20, height 4\' 11"  (1.499 m), weight 47.4 kg (104 lb 9.6 oz), SpO2 98 %.  PHYSICAL EXAMINATION:   GENERAL:  83 y.o.-year-old patient  sittingin the bed with no acute distress. The patient is hoarse EYES: Pupils equal, round, reactive to light and accommodation. No scleral icterus. Extraocular muscles intact.  HEENT: Head atraumatic, normocephalic. NECK:  Supple, no jugular venous distention. No thyroid enlargement, no tenderness.  LUNGS: diminished breath sounds bilaterally, no wheezing, few scattered basilar rales,rhonchi and crepitations . Intermittent use of accessory muscles of respiration, with movements or speech.  CARDIOVASCULAR: S1, S2 normal. No murmurs, rubs, or gallops.  ABDOMEN:   No significant tenderness on the right side of the abdomen on palpation today, no  abdominal distention, soft, no guarding  EXTREMITIES: No pedal edema, cyanosis, or clubbing.  NEUROLOGIC: Cranial nerves II through XII are intact. Muscle strength  at has baseline Sensation intact. Gait not checked.  PSYCHIATRIC: The patient is alert and oriented x 3.  SKIN: No obvious rash, lesion, or ulcer.   ORDERS/RESULTS REVIEWED:   CBC  Recent Labs Lab 03/14/17 0630 03/15/17 0454 03/16/17 0339 03/17/17 0357 03/18/17 0440  WBC 14.3* 14.2* 12.9* 12.5* 12.0*  HGB 11.6* 11.4* 11.0* 10.8* 10.8*  HCT 34.7* 33.9* 33.5* 32.7*  32.3*    PLT 208 216 211 202 229  MCV 87.6 86.9 91.0 88.6 88.1  MCH 29.3 29.3 29.9 29.3 29.6  MCHC 33.5 33.7 32.9 33.1 33.6  RDW 16.6* 16.6* 16.5* 16.7* 16.1*   ------------------------------------------------------------------------------------------------------------------  Chemistries   Recent Labs Lab 03/14/17 0630 03/15/17 0454 03/15/17 1154 03/16/17 0339 03/17/17 0357 03/18/17 0440  NA 142 142  --  146* 142 139  K 4.2 4.3  --  4.2 4.4 4.9  CL 110 109  --  113* 110 108  CO2 25 26  --  26 25 26   GLUCOSE 116* 144*  --  116* 132* 90  BUN 62* 59*  --  57* 56* 51*  CREATININE 1.24* 1.12*  --  1.15* 1.11* 1.24*  CALCIUM 8.9 9.2  --  9.3 9.2 9.2  MG 2.3 2.3  --  2.3 2.2 2.1  AST  --   --  29  --   --  24  ALT  --   --  25  --   --  44  ALKPHOS  --   --  97  --   --  90  BILITOT  --   --  0.5  --   --  0.3   ------------------------------------------------------------------------------------------------------------------ estimated creatinine clearance is 18.9 mL/min (A) (by C-G formula based on SCr of 1.24 mg/dL (H)). ------------------------------------------------------------------------------------------------------------------ No results for input(s): TSH, T4TOTAL, T3FREE, THYROIDAB in the last 72 hours.  Invalid input(s): FREET3  Cardiac Enzymes No results for input(s): CKMB, TROPONINI, MYOGLOBIN in the last 168 hours.  Invalid input(s): CK ------------------------------------------------------------------------------------------------------------------ Invalid input(s): POCBNP ---------------------------------------------------------------------------------------------------------------  RADIOLOGY: Dg Esophagus  Result Date: 03/18/2017 CLINICAL DATA:  History of gastroesophageal reflux, nausea, vomiting, and abdominal pain. Possible aspiration. EXAM: ESOPHOGRAM/BARIUM SWALLOW TECHNIQUE: Single contrast examination was performed using thin barium or water soluble. A barium  tablet was administered. FLUOROSCOPY TIME:  Fluoroscopy Time:  2 minutes, 30 seconds Radiation Exposure Index (if provided by the fluoroscopic device): 1839 micro Gy per meter squared Number of Acquired Spot Images:  2 +multiple video loops. COMPARISON:  No recent barium swallow studies in PACs. CT scan of the chest of September 05, 2016 FINDINGS: The patient was unable to stand but was able to turn with assistance. The examination was performed in the semi recumbent position. The cervical is crash in the hypopharynx distended well. There was no laryngeal penetration of the barium. There was a small diverticulum at approximately the C5-6 level as well as a prominent cricopharyngeus muscle impression. The barium tablets stuck just above the cricopharyngeus muscle but was ultimately propelled inferiorly to the junction of the middle and distal thirds of the esophagus. Here it was allowed to dissolve given that it would not pass more inferiorly with additional sips of barium or water. In the thoracic esophagus multiple tertiary contractions were observed. There was a small incompletely reducible hiatal hernia. IMPRESSION: No laryngeal penetration of the barium. Normal initiation of the voluntary component of the swallowing maneuver. Small pulsion diverticulum at approximately the C5-6 level with a prominent cricopharyngeus muscle impression just inferior to it. The barium tablet hung transiently above the cricopharyngeus muscle but then was propelled into the esophagus. Changes of presbyesophagus with prominent tertiary contractions but overall esophageal peristalsis was good for the patient's age and the semi recumbent position. No high-grade fixed stricture was observed. Rather, the caliber of the esophagus gradually narrowed below the junction of the middle and distal thirds and the tablet would not pass beyond this point. Small  non reducible hiatal hernia.  No reflux was observed. Electronically Signed   By: David   Martinique M.D.   On: 03/18/2017 13:52   US Abdomen Limited Ruq  Result Date: 03/18/2017 CLINICAL DATA:  Right upper quadrant pain. EXAM: ULTRASOUND ABDOMEN LIMITED RIGHT UPPER QUADRANT COMPARISON:  None. FINDINGS: Gallbladder: No gallstones or wall thickening visualized. No sonographic Murphy sign noted by sonographer. Common bile duct: Diameter: 8.8 mm, within normal limits for age. Liver: No focal lesion identified. Within normal limits in parenchymal echogenicity. Portal vein is patent on color Doppler imaging with normal direction of blood flow towards the liver. IMPRESSION: No significant abnormalities. Electronically Signed   By: Lorriane Shire M.D.   On: 03/18/2017 09:16    EKG:  Orders placed or performed during the hospital encounter of 09/05/16  . ED EKG  . ED EKG    ASSESSMENT AND PLAN:  Active Problems:   Partial small bowel obstruction (HCC)   Ileus (HCC)   Protein-calorie malnutrition, severe   Problems with swallowing and mastication   #  Acute renal injury, known CAD stage III, improved on IV fluid administration Monitor renal function and avoid nephrotoxins Creatinine 1.7-1.65-1.33 -1.45 --1.46- 1.24-1.11-1.24, improved to baseline  #. Hypokalemia resolved Repleted and rechecking PRN  #. Hypertension- blood pressure  is  well controlled, resume Norvasc upon discharge to home  #. Anemia, hemoglobin level is stable today, no active bleeding noted.  #  small bowel obstruction    Management per surgery  Clinically better, with some flatus Status post EGD and NG tube placement 03/12/17, NG tube was discontinued  03/15/2017, discussed with Dr. Adonis Huguenin today, diet has been advanced,, continue TPN. The patient admits of dysphagia and has for the regurgitation, she feels that her food stops at upper sternal/throat. Order modified barium swallowing study, discussed with speech therapist.      # . Generalized weakness, PT recommends HHPT  # Sore throat, continue patient on  Cepacol lozenges, improved hoarseness  # Leukocytosis, seems to be better than before,   # aspiration pneumonitis,  repeated chest x-ray revealed worsening consolidation. , continue  clindamycin orally.   # Dysphagia due to esophageal diverticulum, narrowed esophagus, Barium swallow study revealed narrowed esophagus, diverticulum, barium tablet cannot pass distal esophagus, medication should be changed to liquid form, discussed with pharmacist, surgeon. Discussed case with Dr. gastroenterologist, who did not recommend endoscopic intervention at this time. Discussed this with Dr. Adonis Huguenin. Patient is tolerating dysphagia 2 diet, which should be continued, possible discharge home in the afternoon today if the patient does well with lunch     DRUG ALLERGIES:  Allergies  Allergen Reactions  . Penicillin G Hives  . Penicillins Hives    Has patient had a PCN reaction causing immediate rash, facial/tongue/throat swelling, SOB or lightheadedness with hypotension: No Has patient had a PCN reaction causing severe rash involving mucus membranes or skin necrosis: No Has patient had a PCN reaction that required hospitalization No Has patient had a PCN reaction occurring within the last 10 years: No If all of the above answers are "NO", then may proceed with Cephalosporin use.    CODE STATUS:     Code Status Orders        Start     Ordered   03/06/17 2034  Full code  Continuous     03/06/17 2033    Code Status History    Date Active Date Inactive Code Status Order ID Comments User Context   06/08/2016 11:37 AM  06/10/2016  3:53 PM Full Code [id number]  Fritzi Mandes, MD Inpatient    Advance Directive Documentation     Most Recent Value  Type of Advance Directive  Healthcare Power of [place], Living will  Pre-existing out of facility DNR order (yellow form or pink MOST form)  -  "MOST" Form in Place?  -      TOTAL TIME TAKING CARE OF THIS PATIENT: 35 minutes.    Theodoro Grist M.D on  03/19/2017 at 2:21 PM  Between 7am to 6pm - Pager - [phone]  After 6pm go to [email or web address] - password [email or web address] [place] [facility]  Office  [phone]  CC: Primary care physician; Adline Potter, MD

## 2017-03-19 NOTE — Progress Notes (Signed)
Kimberly Lame, MD [facility]   [address], [place], [place] [address] Phone: [phone] Fax : [phone]   Subjective: This patient had an EGD by me recently that did not show any narrowing her esophagus.  The patient did have a barium swallow that showed a smooth taper with the barium pill being hung up.  This is most likely due to her dysmotility seen on the barium swallow with tertiary contractions.  The patient is reported to have eaten well today by her son.  The barium swallow did not show any aspiration.   Objective: Vital signs in last 24 hours: Vitals:   03/18/17 2053 03/19/17 0441 03/19/17 0500 03/19/17 1217  BP: 132/66 132/66  116/62  Pulse: 78 70  79  Resp: 17   20  Temp: 98.1 F (36.7 C) (!) 97.4 F (36.3 C)  (!) 97.5 F (36.4 C)  TempSrc: Oral Oral  Oral  SpO2: 95% 97%  98%  Weight:   104 lb 9.6 oz (47.4 kg)   Height:       Weight change: -4 lb 9.6 oz (-2.087 kg)  Intake/Output Summary (Last 24 hours) at 03/19/17 1753 Last data filed at 03/19/17 1630  Gross per 24 hour  Intake          1373.75 ml  Output              850 ml  Net           523.75 ml     Exam: Heart:: Regular rate and rhythm, S1S2 present or without murmur or extra heart sounds Lungs: normal and clear to auscultation and percussion Abdomen: soft, nontender, normal bowel sounds   Lab Results: @LABTEST2 @ Micro Results: Recent Results (from the past 240 hour(s))  Culture, expectorated sputum-assessment     Status: None   Collection Time: 03/18/17 11:11 AM  Result Value Ref Range Status   Specimen Description EXPECTORATED SPUTUM  Final   Special Requests NONE  Final   Sputum evaluation THIS SPECIMEN IS ACCEPTABLE FOR SPUTUM CULTURE  Final   Report Status 03/18/2017 FINAL  Final  Culture, respiratory (NON-Expectorated)     Status: None (Preliminary result)   Collection Time: 03/18/17 11:11 AM  Result Value Ref Range Status   Specimen Description EXPECTORATED SPUTUM  Final   Special Requests NONE Reflexed from [id number]  Final   Gram Stain   Final    RARE WBC PRESENT, PREDOMINANTLY PMN RARE GRAM POSITIVE RODS RARE GRAM POSITIVE COCCI RARE YEAST    Culture   Final    CULTURE REINCUBATED FOR BETTER GROWTH Performed at [facility], [address] [place], [place] [address]    Report Status PENDING  Incomplete   Studies/Results: Dg Esophagus  Result Date: 03/18/2017 CLINICAL DATA:  History of gastroesophageal reflux, nausea, vomiting, and abdominal pain. Possible aspiration. EXAM: ESOPHOGRAM/BARIUM SWALLOW TECHNIQUE: Single contrast examination was performed using thin barium or water soluble. A barium tablet was administered. FLUOROSCOPY TIME:  Fluoroscopy Time:  2 minutes, 30 seconds Radiation Exposure Index (if provided by the fluoroscopic device): 1839 micro Gy per meter squared Number of Acquired Spot Images:  2 +multiple video loops. COMPARISON:  No recent barium swallow studies in PACs. CT scan of the chest of September 05, 2016 FINDINGS: The patient was unable to stand but was able to turn with assistance. The examination was performed in the semi recumbent position. The cervical is crash in the hypopharynx distended well. There was no laryngeal penetration of the  barium. There was a small diverticulum at approximately the C5-6 level as well as a prominent cricopharyngeus muscle impression. The barium tablets stuck just above the cricopharyngeus muscle but was ultimately propelled inferiorly to the junction of the middle and distal thirds of the esophagus. Here it was allowed to dissolve given that it would not pass more inferiorly with additional sips of barium or water. In the thoracic esophagus multiple tertiary contractions were observed. There was a small incompletely reducible hiatal hernia. IMPRESSION: No laryngeal penetration of the barium. Normal initiation of the voluntary component of the swallowing maneuver. Small pulsion diverticulum at approximately  the C5-6 level with a prominent cricopharyngeus muscle impression just inferior to it. The barium tablet hung transiently above the cricopharyngeus muscle but then was propelled into the esophagus. Changes of presbyesophagus with prominent tertiary contractions but overall esophageal peristalsis was good for the patient's age and the semi recumbent position. No high-grade fixed stricture was observed. Rather, the caliber of the esophagus gradually narrowed below the junction of the middle and distal thirds and the tablet would not pass beyond this point. Small non reducible hiatal hernia.  No reflux was observed. Electronically Signed   By: David  Martinique M.D.   On: 03/18/2017 13:52   US Abdomen Limited Ruq  Result Date: 03/18/2017 CLINICAL DATA:  Right upper quadrant pain. EXAM: ULTRASOUND ABDOMEN LIMITED RIGHT UPPER QUADRANT COMPARISON:  None. FINDINGS: Gallbladder: No gallstones or wall thickening visualized. No sonographic Murphy sign noted by sonographer. Common bile duct: Diameter: 8.8 mm, within normal limits for age. Liver: No focal lesion identified. Within normal limits in parenchymal echogenicity. Portal vein is patent on color Doppler imaging with normal direction of blood flow towards the liver. IMPRESSION: No significant abnormalities. Electronically Signed   By: Lorriane Shire M.D.   On: 03/18/2017 09:16   Medications: I have reviewed the patient's current medications. Scheduled Meds: . clindamycin  300 mg Oral Q8H  . diatrizoate meglumine-sodium  90 mL Oral Once  . feeding supplement (ENSURE ENLIVE)  237 mL Oral BID BM  . heparin  5,000 Units Subcutaneous Q8H  . insulin aspart  0-9 Units Subcutaneous TID WC & HS  . pantoprazole sodium  40 mg Oral BID AC  . scopolamine  1 patch Transdermal Q72H   Continuous Infusions: . sodium chloride    . dextrose 5% lactated ringers Stopped (03/19/17 1200)   PRN Meds:.sodium chloride, acetaminophen **OR** acetaminophen, albuterol, alum & mag  hydroxide-simeth, menthol-cetylpyridinium, metoprolol tartrate, metoprolol tartrate, nitroGLYCERIN, ondansetron (ZOFRAN) IV, phenol, prochlorperazine, sodium chloride flush   Assessment: Active Problems:   Partial small bowel obstruction (HCC)   Ileus (HCC)   Protein-calorie malnutrition, severe   Problems with swallowing and mastication    Plan: This patient had an upper endoscopy and was seen by me last week.  I'm now being asked to see the patient for possible aspiration.  The patient had a barium swallow that did not show any aspiration.  The patient also had an upper endoscopy that did not show any treatable narrowing or strictures.  The patient did have a barium swallow that showed tertiary contractions and signs of dysmotility.  The patient will not benefit from any further endoscopic procedures and appears to be eating better while she is getting stronger.  Nothing further to do from a GI point of view.  I have discussed the case with Dr. Adonis Huguenin and surgery.   LOS: 13 days   Kimberly Abbott 03/19/2017, 5:53 PM

## 2017-03-19 NOTE — Progress Notes (Signed)
CC: Bowel obstruction Subjective: Patient appears much more alert, conversant, comfortable this morning. She is actually eating her breakfast upon my entering the room. Denies any abdominal pain, nausea, vomiting.  Objective: Vital signs in last 24 hours: Temp:  [97.4 F (36.3 C)-98.1 F (36.7 C)] 97.4 F (36.3 C) (08/21 0441) Pulse Rate:  [70-81] 70 (08/21 0441) Resp:  [17] 17 (08/20 2053) BP: (108-148)/(66-82) 132/66 (08/21 0441) SpO2:  [95 %-97 %] 97 % (08/21 0441) Weight:  [47.4 kg (104 lb 9.6 oz)] 47.4 kg (104 lb 9.6 oz) (08/21 0500) Last BM Date: 03/18/17  Intake/Output from previous day: 08/20 0701 - 08/21 0700 In: 2003.8 [I.V.:1953.8; IV Piggyback:50] Out: 1100 [Urine:1100] Intake/Output this shift: Total I/O In: 180 [I.V.:121; IV Piggyback:59] Out: 200 [Urine:200]  Physical exam:  Gen.: No acute distress  chest: Clear to auscultation Heart: Regular rate and rhythm Abdomen: Soft, nontender, nondistended  Lab Results: CBC   Recent Labs  03/17/17 0357 03/18/17 0440  WBC 12.5* 12.0*  HGB 10.8* 10.8*  HCT 32.7* 32.3*  PLT 202 229   BMET  Recent Labs  03/17/17 0357 03/18/17 0440  NA 142 139  K 4.4 4.9  CL 110 108  CO2 25 26  GLUCOSE 132* 90  BUN 56* 51*  CREATININE 1.11* 1.24*  CALCIUM 9.2 9.2   PT/INR No results for input(s): LABPROT, INR in the last 72 hours. ABG No results for input(s): PHART, HCO3 in the last 72 hours.  Invalid input(s): PCO2, PO2  Studies/Results: Dg Esophagus  Result Date: 03/18/2017 CLINICAL DATA:  History of gastroesophageal reflux, nausea, vomiting, and abdominal pain. Possible aspiration. EXAM: ESOPHOGRAM/BARIUM SWALLOW TECHNIQUE: Single contrast examination was performed using thin barium or water soluble. A barium tablet was administered. FLUOROSCOPY TIME:  Fluoroscopy Time:  2 minutes, 30 seconds Radiation Exposure Index (if provided by the fluoroscopic device): 1839 micro Gy per meter squared Number of Acquired  Spot Images:  2 +multiple video loops. COMPARISON:  No recent barium swallow studies in PACs. CT scan of the chest of September 05, 2016 FINDINGS: The patient was unable to stand but was able to turn with assistance. The examination was performed in the semi recumbent position. The cervical is crash in the hypopharynx distended well. There was no laryngeal penetration of the barium. There was a small diverticulum at approximately the C5-6 level as well as a prominent cricopharyngeus muscle impression. The barium tablets stuck just above the cricopharyngeus muscle but was ultimately propelled inferiorly to the junction of the middle and distal thirds of the esophagus. Here it was allowed to dissolve given that it would not pass more inferiorly with additional sips of barium or water. In the thoracic esophagus multiple tertiary contractions were observed. There was a small incompletely reducible hiatal hernia. IMPRESSION: No laryngeal penetration of the barium. Normal initiation of the voluntary component of the swallowing maneuver. Small pulsion diverticulum at approximately the C5-6 level with a prominent cricopharyngeus muscle impression just inferior to it. The barium tablet hung transiently above the cricopharyngeus muscle but then was propelled into the esophagus. Changes of presbyesophagus with prominent tertiary contractions but overall esophageal peristalsis was good for the patient's age and the semi recumbent position. No high-grade fixed stricture was observed. Rather, the caliber of the esophagus gradually narrowed below the junction of the middle and distal thirds and the tablet would not pass beyond this point. Small non reducible hiatal hernia.  No reflux was observed. Electronically Signed   By: David  Martinique M.D.  On: 03/18/2017 13:52   US Abdomen Limited Ruq  Result Date: 03/18/2017 CLINICAL DATA:  Right upper quadrant pain. EXAM: ULTRASOUND ABDOMEN LIMITED RIGHT UPPER QUADRANT COMPARISON:  None.  FINDINGS: Gallbladder: No gallstones or wall thickening visualized. No sonographic Murphy sign noted by sonographer. Common bile duct: Diameter: 8.8 mm, within normal limits for age. Liver: No focal lesion identified. Within normal limits in parenchymal echogenicity. Portal vein is patent on color Doppler imaging with normal direction of blood flow towards the liver. IMPRESSION: No significant abnormalities. Electronically Signed   By: Lorriane Shire M.D.   On: 03/18/2017 09:16    Anti-infectives: Anti-infectives    Start     Dose/Rate Route Frequency Ordered Stop   03/17/17 1400  clindamycin (CLEOCIN) IVPB 300 mg     300 mg 100 mL/hr over 30 Minutes Intravenous Every 8 hours 03/17/17 0947        Assessment/Plan:  83 year old female admitted with a small bowel obstruction. Doing much better today. Discussed with the patient and the family member if she continues to do this well throughout the day the possibility of discharge home this afternoon. Encourage ambulation, incentive spirometer usage, oral intake.  Phinley Schall T. Adonis Huguenin, MD, [facility] General Surgeon [facility]  Day [facility] [phone] Night [facility] [phone] 03/19/2017

## 2017-03-19 NOTE — Progress Notes (Signed)
Per Dr. Adonis Huguenin saline lock maintenance IV fluids.

## 2017-03-19 NOTE — Care Management (Signed)
Spoke with patient and son Edgardo Roys.  Patient will discharge to Gregory's home at [place]. House phone: [phone].  Gregory's cell [phone].  Daughter Angely Dietz- [phone].  Belenda Cruise was not aware a list of home health agencies was provided earlier.  Provided another list but found that [facility] at present was the only local agency that is accepting patient's insurance.  [facility] not able to provide nursing.  Informed Belenda Cruise.  [facility] accepted referral

## 2017-03-19 NOTE — Progress Notes (Signed)
  Speech Language Pathology Treatment: Dysphagia  Patient Details Name: Kimberly Abbott MRN: [id number] DOB: [birth year] Today's Date: 03/19/2017 Time: 1100-1120 SLP Time Calculation (min) (ACUTE ONLY): 20 min  Assessment / Plan / Recommendation Clinical Impression  Pt seen at bedside to assess appropriateness for advanced diet. Per Barium Swallow completed yesterday, pt has no penetration or aspiration, but does exhibit a prominent CP and presbyesophagus. Pt appeared to tolerate trials of thin liquid and graham cracker without overt s/s aspiration, and without regurgitation. Recommend advancing diet conservatively, beginning with Dys 2 (finely chopped) and thin liquids. Pills may be given with liquid, but recommend crushing large pills due to esophageal issues. ST posted precautions at [facility] and discussed with pt/family. ST will follow for assessment of diet tolerance and to continue education as needed. RN informed.    HPI HPI: 83 year old female admitted 03/06/17 with abdominal pain and emesis. PMH significant for HLD, HTN, CKD, GERD.      SLP Plan  Continue with current plan of care       Recommendations  Diet recommendations: Dysphagia 2 (fine chop);Thin liquid Liquids provided via: Cup;Straw Medication Administration: Whole meds with puree (crush large pills) Supervision: Patient able to self feed;Staff to assist with self feeding;Intermittent supervision to cue for compensatory strategies Compensations: Minimize environmental distractions;Slow rate;Small sips/bites;Follow solids with liquid Postural Changes and/or Swallow Maneuvers: Seated upright 90 degrees;Upright 30-60 min after meal                Oral Care Recommendations: Oral care BID Follow up Recommendations: None SLP Visit Diagnosis: Dysphagia, pharyngoesophageal phase (R13.14) Plan: Continue with current plan of care       GO              Kimberly Abbott [facility], CCC-SLP Speech Pathologist [phone]  Shonna Chock 03/19/2017, 11:29 AM

## 2017-03-19 NOTE — Progress Notes (Signed)
PT Cancellation Note  Patient Details Name: Kimberly Abbott MRN: [id number] DOB: Jun 09, 1921   Cancelled Treatment:    Reason Eval/Treat Not Completed: Other (comment);Patient declined, no reason specified. Treatment attempted this afternoon twice. Pt initially wished therapy to check back; second attempt, pt just received dinner and does not wish to participate at this time. Short discussion with family member notes that pt does not like to perform exercises, but will on occasion with being led. Pt did not wish out of bed either to eat her meal. Re attempt at a later date, as the schedule allows.    Larae Grooms, PTA 03/19/2017, 5:24 PM

## 2017-03-20 LAB — GLUCOSE, CAPILLARY
Glucose-Capillary: 93 mg/dL (ref 65–99)
Glucose-Capillary: 97 mg/dL (ref 65–99)

## 2017-03-20 MED ORDER — SODIUM CHLORIDE 0.9 % IV SOLN: 250.0000 mL | INTRAVENOUS | Status: DC | PRN

## 2017-03-20 MED ORDER — CLINDAMYCIN PALMITATE HCL 75 MG/5ML PO SOLR: 300.0000 mg | Freq: Three times a day (TID) | ORAL | 0 refills | Status: DC

## 2017-03-20 MED ORDER — PANTOPRAZOLE SODIUM 40 MG PO PACK: 40.0000 mg | PACK | Freq: Two times a day (BID) | ORAL | 0 refills | Status: DC

## 2017-03-20 NOTE — Discharge Summary (Signed)
Patient ID: Kimberly Abbott MRN: [id number] DOB/AGE: 03/21/1921 83 y.o.  Admit date: 03/06/2017 Discharge date: 03/20/2017  Discharge Diagnoses:  Small bowel obstruction  Procedures Performed: None  Discharged Condition: good  Hospital Course: Patient admitted with a partial small bowel obstruction. Had a prolonged hospital stay secondary to length of time it took for the obstruction resolved. After the obstruction resolved patient then had cognitive issues and possible aspiration requiring additional aspiration workup. On the day of discharge she was tolerating a diet, having bowel function, her abdomen was soft and nontender.  Discharge Orders: Discharge Instructions    Call MD for:  difficulty breathing, headache or visual disturbances    Complete by:  As directed    Call MD for:  persistant nausea and vomiting    Complete by:  As directed    Call MD for:  redness, tenderness, or signs of infection (pain, swelling, redness, odor or green/yellow discharge around incision site)    Complete by:  As directed    Call MD for:  severe uncontrolled pain    Complete by:  As directed    Call MD for:  temperature >100.4    Complete by:  As directed    Diet - low sodium heart healthy    Complete by:  As directed    Increase activity slowly    Complete by:  As directed       Disposition: 01-Home or Self Care  Discharge Medications: Allergies as of 03/20/2017      Reactions   Penicillin G Hives   Penicillins Hives   Has patient had a PCN reaction causing immediate rash, facial/tongue/throat swelling, SOB or lightheadedness with hypotension: No Has patient had a PCN reaction causing severe rash involving mucus membranes or skin necrosis: No Has patient had a PCN reaction that required hospitalization No Has patient had a PCN reaction occurring within the last 10 years: No If all of the above answers are "NO", then may proceed with Cephalosporin use.      Medication List    STOP  taking these medications   sulfamethoxazole-trimethoprim 800-160 MG tablet Commonly known as:  BACTRIM DS,SEPTRA DS     TAKE these medications   allopurinol 100 MG tablet Commonly known as:  ZYLOPRIM Take 100 mg by mouth daily.   amLODipine 5 MG tablet Commonly known as:  NORVASC Take 1 tablet (5 mg total) by mouth daily.   clindamycin 75 MG/5ML solution Commonly known as:  CLEOCIN Take 20 mLs (300 mg total) by mouth every 8 (eight) hours.   [employer] CRANBERRY 500 MG Caps Generic drug:  Cranberry Take 1 capsule by mouth daily.   feeding supplement (ENSURE ENLIVE) Liqd Take 237 mLs by mouth 2 (two) times daily between meals.   oxybutynin 5 MG tablet Commonly known as:  DITROPAN Take 5 mg by mouth 2 (two) times daily.   pantoprazole sodium 40 mg/20 mL Pack Commonly known as:  PROTONIX Take 20 mLs (40 mg total) by mouth 2 (two) times daily before a meal.   Vitamin D3 2000 units capsule Take 1 capsule (2,000 Units total) by mouth daily.            Discharge Care Instructions        Start     Ordered   03/20/17 0000  clindamycin (CLEOCIN) 75 MG/5ML solution  Every 8 hours     03/20/17 0853   03/20/17 0000  pantoprazole sodium (PROTONIX) 40 mg/20 mL PACK  2 times daily before meals  03/20/17 0853   03/20/17 0000  Increase activity slowly     03/20/17 0853   03/20/17 0000  Diet - low sodium heart healthy     03/20/17 0853   03/20/17 0000  Call MD for:  temperature >100.4     03/20/17 0853   03/20/17 0000  Call MD for:  persistant nausea and vomiting     03/20/17 0853   03/20/17 0000  Call MD for:  severe uncontrolled pain     03/20/17 0853   03/20/17 0000  Call MD for:  redness, tenderness, or signs of infection (pain, swelling, redness, odor or green/yellow discharge around incision site)     03/20/17 0853   03/20/17 0000  Call MD for:  difficulty breathing, headache or visual disturbances     03/20/17 0853       Follwup: Follow-up Information    Kolluru,  Sarath, MD Follow up in 1 week(s).   Specialty:  Internal Medicine Contact information: [address] [place] [address] [phone]        [facility], [facility] Follow up.   Why:  Home health nurse, physical therapy and aide.  Agency will make home visit within 24 hours of discharge.  Additional contact for agnecy- [phone] Contact information: [place] [address] [phone]           Signed: Clayburn Pert 03/20/2017, 8:54 AM

## 2017-03-20 NOTE — Care Management Important Message (Signed)
Important Message  Patient Details  Name: Kimberly Abbott MRN: [id number] Date of Birth: 07-23-1921   Medicare Important Message Given:  Yes Signed IM notice given    Katrina Stack, RN 03/20/2017, 9:52 AM

## 2017-03-20 NOTE — Progress Notes (Signed)
Chaplain made a follow up visit with pt. Pt was sitting on the bed at the time of this visit. Pt states that she is tired and that her this has been a rough road to travel. Pt mentioned she is looking forward to discharge today. Upon request, [place] encouraged pt, offered spiritual support and prayers for pt.    03/20/17 0900  Clinical Encounter Type  Visited With Patient  Visit Type Follow-up  Referral From Family  Consult/Referral To Chaplain  Spiritual Encounters  Spiritual Needs Prayer;Other (Comment)

## 2017-03-20 NOTE — Progress Notes (Signed)
Per Dr. Adonis Huguenin okay to place order to discontinue PICC line prior to discharge.

## 2017-03-20 NOTE — Progress Notes (Signed)
[facility] [place] NAME: Kimberly Abbott    MR#:  [id number]  DATE OF BIRTH:  [birth year]  SUBJECTIVE:  CHIEF COMPLAINT:   Chief Complaint  Patient presents with  . Abdominal Pain  . Emesis  The patient is 83 year old female with past medical history significant for history of hypertension, hyperlipidemia, CAD, gastroesophageal reflux disease, multiple abdominal surgeries, including appendectomy, rectal fistula surgery, tubal ligation, who presents to the hospital with complaints of nausea, vomiting, abdominal pain. CAT scan in the emergency room revealed partial small bowel obstruction. Patient continues to have some nausea,  But no vomiting, she is passing gas per rectum, but no stool since 03/10/2017. She has been followed by general surgery, managed with NG tube placement and suctioning, NGT was discontinued 03/15/2017, oral intake remains minimal, Patient complains of regurgitation, feeling like food is stopping at the throat.   PatientIs admitted to the hospitalist service but changed to general surgery service 03/12/17 for persistent ileus/SBO   Patient Feels Some better overall after small bowel movement 03/18/2017, able to eat some food, barium swallowing study, however, revealed narrowing esophagus, diverticulum in upper esophagus, gastroenterologist  felt that no stretching can be done, but supportive therapy. Patient is on dysphagia 2 diet, which is recommended to be continued at home. Discussed with Dr. Adonis Huguenin yesterday, likely discharge home today   Review of Systems  Constitutional: Negative for chills, fever and weight loss.  HENT: Positive for sore throat. Negative for congestion.   Eyes: Negative for blurred vision and double vision.  Respiratory: Negative for cough, sputum production, shortness of breath and wheezing.   Cardiovascular: Negative for chest pain, palpitations, orthopnea, leg swelling and PND.    Gastrointestinal: Positive for abdominal pain, constipation and nausea. Negative for blood in stool, diarrhea and vomiting.  Genitourinary: Negative for dysuria, frequency, hematuria and urgency.  Musculoskeletal: Negative for falls.  Neurological: Negative for dizziness, tremors, focal weakness and headaches.  Endo/Heme/Allergies: Does not bruise/bleed easily.  Psychiatric/Behavioral: Negative for depression. The patient does not have insomnia.     VITAL SIGNS: Blood pressure 128/75, pulse 73, temperature 98.3 F (36.8 C), temperature source Oral, resp. rate 20, height 4\' 11"  (1.499 m), weight 49.9 kg (110 lb), SpO2 98 %.  PHYSICAL EXAMINATION:   GENERAL:  83 y.o.-year-old patient  sittingin the bed with no acute distress. The patient is hoarse EYES: Pupils equal, round, reactive to light and accommodation. No scleral icterus. Extraocular muscles intact.  HEENT: Head atraumatic, normocephalic. NECK:  Supple, no jugular venous distention. No thyroid enlargement, no tenderness.  LUNGS: diminished breath sounds bilaterally, no wheezing, few scattered basilar rales,rhonchi and crepitations . Intermittent use of accessory muscles of respiration, with movements or speech.  CARDIOVASCULAR: S1, S2 normal. No murmurs, rubs, or gallops.  ABDOMEN:   No tenderness on  abdominal palpation today, no  abdominal distention, soft, no guarding  EXTREMITIES: No pedal edema, cyanosis, or clubbing.  NEUROLOGIC: Cranial nerves II through XII are intact. Muscle strength  at has baseline Sensation intact. Gait not checked.  PSYCHIATRIC: The patient is alert and oriented x 3.  SKIN: No obvious rash, lesion, or ulcer.   ORDERS/RESULTS REVIEWED:   CBC  Recent Labs Lab 03/14/17 0630 03/15/17 0454 03/16/17 0339 03/17/17 0357 03/18/17 0440  WBC 14.3* 14.2* 12.9* 12.5* 12.0*  HGB 11.6* 11.4* 11.0* 10.8* 10.8*  HCT 34.7* 33.9* 33.5* 32.7* 32.3*  PLT 208 216 211 202 229  MCV 87.6 86.9 91.0 88.6 88.1  MCH  29.3 29.3 29.9 29.3 29.6  MCHC 33.5 33.7 32.9 33.1 33.6  RDW 16.6* 16.6* 16.5* 16.7* 16.1*   ------------------------------------------------------------------------------------------------------------------  Chemistries   Recent Labs Lab 03/14/17 0630 03/15/17 0454 03/15/17 1154 03/16/17 0339 03/17/17 0357 03/18/17 0440  NA 142 142  --  146* 142 139  K 4.2 4.3  --  4.2 4.4 4.9  CL 110 109  --  113* 110 108  CO2 25 26  --  26 25 26   GLUCOSE 116* 144*  --  116* 132* 90  BUN 62* 59*  --  57* 56* 51*  CREATININE 1.24* 1.12*  --  1.15* 1.11* 1.24*  CALCIUM 8.9 9.2  --  9.3 9.2 9.2  MG 2.3 2.3  --  2.3 2.2 2.1  AST  --   --  29  --   --  24  ALT  --   --  25  --   --  44  ALKPHOS  --   --  97  --   --  90  BILITOT  --   --  0.5  --   --  0.3   ------------------------------------------------------------------------------------------------------------------ estimated creatinine clearance is 18.9 mL/min (A) (by C-G formula based on SCr of 1.24 mg/dL (H)). ------------------------------------------------------------------------------------------------------------------ No results for input(s): TSH, T4TOTAL, T3FREE, THYROIDAB in the last 72 hours.  Invalid input(s): FREET3  Cardiac Enzymes No results for input(s): CKMB, TROPONINI, MYOGLOBIN in the last 168 hours.  Invalid input(s): CK ------------------------------------------------------------------------------------------------------------------ Invalid input(s): POCBNP ---------------------------------------------------------------------------------------------------------------  RADIOLOGY: Dg Esophagus  Result Date: 03/18/2017 CLINICAL DATA:  History of gastroesophageal reflux, nausea, vomiting, and abdominal pain. Possible aspiration. EXAM: ESOPHOGRAM/BARIUM SWALLOW TECHNIQUE: Single contrast examination was performed using thin barium or water soluble. A barium tablet was administered. FLUOROSCOPY TIME:  Fluoroscopy Time:   2 minutes, 30 seconds Radiation Exposure Index (if provided by the fluoroscopic device): 1839 micro Gy per meter squared Number of Acquired Spot Images:  2 +multiple video loops. COMPARISON:  No recent barium swallow studies in PACs. CT scan of the chest of September 05, 2016 FINDINGS: The patient was unable to stand but was able to turn with assistance. The examination was performed in the semi recumbent position. The cervical is crash in the hypopharynx distended well. There was no laryngeal penetration of the barium. There was a small diverticulum at approximately the C5-6 level as well as a prominent cricopharyngeus muscle impression. The barium tablets stuck just above the cricopharyngeus muscle but was ultimately propelled inferiorly to the junction of the middle and distal thirds of the esophagus. Here it was allowed to dissolve given that it would not pass more inferiorly with additional sips of barium or water. In the thoracic esophagus multiple tertiary contractions were observed. There was a small incompletely reducible hiatal hernia. IMPRESSION: No laryngeal penetration of the barium. Normal initiation of the voluntary component of the swallowing maneuver. Small pulsion diverticulum at approximately the C5-6 level with a prominent cricopharyngeus muscle impression just inferior to it. The barium tablet hung transiently above the cricopharyngeus muscle but then was propelled into the esophagus. Changes of presbyesophagus with prominent tertiary contractions but overall esophageal peristalsis was good for the patient's age and the semi recumbent position. No high-grade fixed stricture was observed. Rather, the caliber of the esophagus gradually narrowed below the junction of the middle and distal thirds and the tablet would not pass beyond this point. Small non reducible hiatal hernia.  No reflux was observed. Electronically Signed   By: David  Martinique  M.D.   On: 03/18/2017 13:52    EKG:  Orders placed or  performed during the hospital encounter of 09/05/16  . ED EKG  . ED EKG    ASSESSMENT AND PLAN:  Active Problems:   Partial small bowel obstruction (HCC)   Ileus (HCC)   Protein-calorie malnutrition, severe   Problems with swallowing and mastication   #  Acute renal injury, known CAD stage III, improved on IV fluid administration Monitor renal function and avoid nephrotoxins Creatinine 1.7-1.65-1.33 -1.45 --1.46- 1.24-1.11-1.24, improved to baseline  #. Hypokalemia resolved Repleted and rechecking PRN  #. Hypertension- blood pressure  is  well controlled, resume Norvasc upon discharge to home  #. Anemia, hemoglobin level is stable today, no active bleeding noted.  #  small bowel obstruction    Management per surgery  Clinically better, with some flatus Status post EGD and NG tube placement 03/12/17, NG tube was discontinued  03/15/2017,  diet has been advanced, barium swallowing study revealed esophageal narrowing, diverticulum, now patient is on dysphagia 2 diet and tolerated well.      # . Generalized weakness, PT recommends HHPT  # Sore throat, continue patient on Cepacol lozenges, improved hoarseness  # Leukocytosis, seems to be better than before,   # aspiration pneumonitis,  repeated chest x-ray revealed worsening consolidation. , continue  clindamycin orally.   # Dysphagia due to esophageal diverticulum, narrowed esophagus, Barium swallow study revealed narrowed esophagus, diverticulum, barium tablet cannot pass distal esophagus, medication should be changed to liquid form, discussed with pharmacist, surgeon  yesterday.  Patient is tolerating dysphagia 2 diet, which should be continued, possible discharge home  today     DRUG ALLERGIES:  Allergies  Allergen Reactions  . Penicillin G Hives  . Penicillins Hives    Has patient had a PCN reaction causing immediate rash, facial/tongue/throat swelling, SOB or lightheadedness with hypotension: No Has patient had a PCN  reaction causing severe rash involving mucus membranes or skin necrosis: No Has patient had a PCN reaction that required hospitalization No Has patient had a PCN reaction occurring within the last 10 years: No If all of the above answers are "NO", then may proceed with Cephalosporin use.    CODE STATUS:     Code Status Orders        Start     Ordered   03/06/17 2034  Full code  Continuous     03/06/17 2033    Code Status History    Date Active Date Inactive Code Status Order ID Comments User Context   06/08/2016 11:37 AM 06/10/2016  3:53 PM Full Code [id number]  Fritzi Mandes, MD Inpatient    Advance Directive Documentation     Most Recent Value  Type of Advance Directive  Healthcare Power of [place], Living will  Pre-existing out of facility DNR order (yellow form or pink MOST form)  -  "MOST" Form in Place?  -      TOTAL TIME TAKING CARE OF THIS PATIENT: 30 minutes.    Theodoro Grist M.D on 03/20/2017 at 1:15 PM  Between 7am to 6pm - Pager - [phone]  After 6pm go to [email or web address] - password [email or web address] [place] [facility]  Office  [phone]  CC: Primary care physician; Adline Potter, MD

## 2017-03-20 NOTE — Discharge Instructions (Signed)
[facility] Hospital Stay Proper nutrition can help your body recover from illness and injury.   Foods and beverages high in protein, vitamins, and minerals help rebuild muscle loss, promote healing, & reduce fall risk.   In addition to eating healthy foods, a nutrition shake is an easy, delicious way to get the nutrition you need during and after your hospital stay  It is recommended that you continue to drink 2 bottles per day of:       Ensure Enlive for at least 1 month (30 days) after your hospital stay   Tips for adding a nutrition shake into your routine: As allowed, drink one with vitamins or medications instead of water or juice Enjoy one as a tasty mid-morning or afternoon snack Drink cold or make a milkshake out of it Drink one instead of milk with cereal or snacks Use as a coffee creamer   Available at the following grocery stores and pharmacies:           * [place] [phone]            For COUPONS visit: www.ensure.com/join or http://dawson-may.com/   Suggested Substitutions Ensure Plus = Boost Plus = Carnation Breakfast Essentials = Boost Compact Ensure Active Clear = Boost Breeze Glucerna Shake = Boost Glucose Control = Carnation Breakfast Essentials SUGAR FREE   Small Bowel Obstruction A small bowel obstruction means that something is blocking the small bowel. The small bowel is also called the small intestine. It is the long tube that connects the stomach to the colon. An obstruction will stop food and fluids from passing through the small bowel. Treatment depends on what is causing the problem and how bad the problem is. Follow these instructions at home:  Get a lot of rest.  Follow your diet as told by your doctor. You may need to: ? Only drink clear liquids until  you start to get better. ? Avoid solid foods as told by your doctor.  Take over-the-counter and prescription medicines only as told by your doctor.  Keep all follow-up visits as told by your doctor. This is important. Contact a doctor if:  You have a fever.  You have chills. Get help right away if:  You have pain or cramps that get worse.  You throw up (vomit) blood.  You have a feeling of being sick to your stomach (nausea) that does not go away.  You cannot stop throwing up.  You cannot drink fluids.  You feel confused.  You feel dry or thirsty (dehydrated).  Your belly gets more bloated.  You feel weak or you pass out (faint). This information is not intended to replace advice given to you by your health care provider. Make sure you discuss any questions you have with your health care provider. Document Released: 08/23/2004 Document Revised: 03/12/2016 Document Reviewed: 09/09/2014 Elsevier Interactive Patient Education  [employer].

## 2017-03-20 NOTE — Progress Notes (Signed)
Kimberly Abbott and O x 4. VSS. Kimberly tolerating diet well. No complaints of pain or nausea. PICC removed intact, prescriptions given. Kimberly and family voiced understanding of discharge instructions with no further questions. Kimberly discharged via wheelchair with axillary.

## 2017-03-22 DIAGNOSIS — M109 Gout, unspecified: Secondary | ICD-10-CM | POA: Diagnosis not present

## 2017-03-22 DIAGNOSIS — I129 Hypertensive chronic kidney disease with stage 1 through stage 4 chronic kidney disease, or unspecified chronic kidney disease: Secondary | ICD-10-CM | POA: Diagnosis not present

## 2017-03-22 DIAGNOSIS — N183 Chronic kidney disease, stage 3 (moderate): Secondary | ICD-10-CM | POA: Diagnosis not present

## 2017-03-22 DIAGNOSIS — K5730 Diverticulosis of large intestine without perforation or abscess without bleeding: Secondary | ICD-10-CM | POA: Diagnosis not present

## 2017-03-22 DIAGNOSIS — R1310 Dysphagia, unspecified: Secondary | ICD-10-CM | POA: Diagnosis not present

## 2017-03-22 DIAGNOSIS — G309 Alzheimer's disease, unspecified: Secondary | ICD-10-CM | POA: Diagnosis not present

## 2017-03-22 DIAGNOSIS — E785 Hyperlipidemia, unspecified: Secondary | ICD-10-CM | POA: Diagnosis not present

## 2017-03-22 DIAGNOSIS — R69 Illness, unspecified: Secondary | ICD-10-CM | POA: Diagnosis not present

## 2017-03-22 DIAGNOSIS — K219 Gastro-esophageal reflux disease without esophagitis: Secondary | ICD-10-CM | POA: Diagnosis not present

## 2017-03-22 LAB — CULTURE, RESPIRATORY

## 2017-03-22 LAB — CULTURE, RESPIRATORY W GRAM STAIN

## 2017-03-23 DIAGNOSIS — K5730 Diverticulosis of large intestine without perforation or abscess without bleeding: Secondary | ICD-10-CM | POA: Diagnosis not present

## 2017-03-23 DIAGNOSIS — R69 Illness, unspecified: Secondary | ICD-10-CM | POA: Diagnosis not present

## 2017-03-23 DIAGNOSIS — M109 Gout, unspecified: Secondary | ICD-10-CM | POA: Diagnosis not present

## 2017-03-23 DIAGNOSIS — G309 Alzheimer's disease, unspecified: Secondary | ICD-10-CM | POA: Diagnosis not present

## 2017-03-23 DIAGNOSIS — N183 Chronic kidney disease, stage 3 (moderate): Secondary | ICD-10-CM | POA: Diagnosis not present

## 2017-03-23 DIAGNOSIS — I129 Hypertensive chronic kidney disease with stage 1 through stage 4 chronic kidney disease, or unspecified chronic kidney disease: Secondary | ICD-10-CM | POA: Diagnosis not present

## 2017-03-23 DIAGNOSIS — R1310 Dysphagia, unspecified: Secondary | ICD-10-CM | POA: Diagnosis not present

## 2017-03-23 DIAGNOSIS — E785 Hyperlipidemia, unspecified: Secondary | ICD-10-CM | POA: Diagnosis not present

## 2017-03-25 NOTE — Progress Notes (Signed)
Attempted to call patient x 2 regarding respiratory culture. Left VM. Will try again 8/28.

## 2017-03-25 NOTE — Care Management (Signed)
Post discharge note entry. This RNCM notified by CSW that patient's insurance Scientist, clinical (histocompatibility and immunogenetics)) called and said that [facility] home health was not in-network with patient's insurance. I have contacted Malachy Mood with [facility] to follow up. Patient was discharged almost a week ago and this message was just received today.

## 2017-03-26 ENCOUNTER — Telehealth: Payer: Self-pay | Admitting: Pharmacist

## 2017-03-26 DIAGNOSIS — G309 Alzheimer's disease, unspecified: Secondary | ICD-10-CM | POA: Diagnosis not present

## 2017-03-26 DIAGNOSIS — R69 Illness, unspecified: Secondary | ICD-10-CM | POA: Diagnosis not present

## 2017-03-26 DIAGNOSIS — I129 Hypertensive chronic kidney disease with stage 1 through stage 4 chronic kidney disease, or unspecified chronic kidney disease: Secondary | ICD-10-CM | POA: Diagnosis not present

## 2017-03-26 DIAGNOSIS — K5730 Diverticulosis of large intestine without perforation or abscess without bleeding: Secondary | ICD-10-CM | POA: Diagnosis not present

## 2017-03-26 DIAGNOSIS — R1310 Dysphagia, unspecified: Secondary | ICD-10-CM | POA: Diagnosis not present

## 2017-03-26 DIAGNOSIS — N183 Chronic kidney disease, stage 3 (moderate): Secondary | ICD-10-CM | POA: Diagnosis not present

## 2017-03-26 DIAGNOSIS — E785 Hyperlipidemia, unspecified: Secondary | ICD-10-CM | POA: Diagnosis not present

## 2017-03-26 DIAGNOSIS — M109 Gout, unspecified: Secondary | ICD-10-CM | POA: Diagnosis not present

## 2017-03-26 NOTE — Progress Notes (Signed)
83 y/o F discharged from [facility] earlier this month after hospitalization for a SBO. Patient was discharged on clindamycin to complete the course of treatment for possible aspiration pneumonia. Subsequent to discharge, the respiratory culture resulted with MSSA resistant to clindamycin. It is possible that this organism represents contamination. After discussion with Dr. Verdell Carmine, the patient was contacted via telephone and reported no fever or worsening respiratory symptoms. Patient was instructed to contact PCP or return to ED upon developing symptoms of febrile illness or upon worsening shortness of breath.   Ulice Dash, PharmD Clinical Pharmacist

## 2017-03-26 NOTE — Progress Notes (Signed)
83 y/o F discharged from [facility] earlier this month after hospitalization for a SBO. Patient was discharged on clindamycin to complete the course of treatment for possible aspiration pneumonia. Subsequent to discharge, the respiratory culture resulted with MSSA resistant to clindamycin. After discussion with Dr. Verdell Carmine, the patient was contacted via telephone and reported no fever or worsening respiratory symptoms. Patient was instructed to contact PCP or return to ED upon developing symptoms of febrile illness or upon worsening shortness of breath.   Ulice Dash, PharmD Clinical Pharmacist

## 2017-03-27 DIAGNOSIS — R69 Illness, unspecified: Secondary | ICD-10-CM | POA: Diagnosis not present

## 2017-03-27 DIAGNOSIS — M109 Gout, unspecified: Secondary | ICD-10-CM | POA: Diagnosis not present

## 2017-03-27 DIAGNOSIS — R1310 Dysphagia, unspecified: Secondary | ICD-10-CM | POA: Diagnosis not present

## 2017-03-27 DIAGNOSIS — K5730 Diverticulosis of large intestine without perforation or abscess without bleeding: Secondary | ICD-10-CM | POA: Diagnosis not present

## 2017-03-27 DIAGNOSIS — N183 Chronic kidney disease, stage 3 (moderate): Secondary | ICD-10-CM | POA: Diagnosis not present

## 2017-03-27 DIAGNOSIS — E785 Hyperlipidemia, unspecified: Secondary | ICD-10-CM | POA: Diagnosis not present

## 2017-03-27 DIAGNOSIS — G309 Alzheimer's disease, unspecified: Secondary | ICD-10-CM | POA: Diagnosis not present

## 2017-03-27 DIAGNOSIS — I129 Hypertensive chronic kidney disease with stage 1 through stage 4 chronic kidney disease, or unspecified chronic kidney disease: Secondary | ICD-10-CM | POA: Diagnosis not present

## 2017-03-28 DIAGNOSIS — N183 Chronic kidney disease, stage 3 (moderate): Secondary | ICD-10-CM | POA: Diagnosis not present

## 2017-03-28 DIAGNOSIS — M109 Gout, unspecified: Secondary | ICD-10-CM | POA: Diagnosis not present

## 2017-03-28 DIAGNOSIS — I129 Hypertensive chronic kidney disease with stage 1 through stage 4 chronic kidney disease, or unspecified chronic kidney disease: Secondary | ICD-10-CM | POA: Diagnosis not present

## 2017-03-28 DIAGNOSIS — K5730 Diverticulosis of large intestine without perforation or abscess without bleeding: Secondary | ICD-10-CM | POA: Diagnosis not present

## 2017-03-28 DIAGNOSIS — R1310 Dysphagia, unspecified: Secondary | ICD-10-CM | POA: Diagnosis not present

## 2017-03-28 DIAGNOSIS — G309 Alzheimer's disease, unspecified: Secondary | ICD-10-CM | POA: Diagnosis not present

## 2017-03-28 DIAGNOSIS — E785 Hyperlipidemia, unspecified: Secondary | ICD-10-CM | POA: Diagnosis not present

## 2017-03-28 DIAGNOSIS — R69 Illness, unspecified: Secondary | ICD-10-CM | POA: Diagnosis not present

## 2017-03-28 NOTE — Care Management (Signed)
CM had a message to call Neoma Uhrich. spoke briefly with her but she will call CM back on a landline due to issues with connection. There has been communication regarding [facility] not being in network with patient's [employer]. The only local home health agency in network is [facility] and unable to accept nursing patients due to staffing.  Spoke with Malachy Mood with [facility] who will speak with [name].

## 2017-03-29 ENCOUNTER — Encounter: Payer: Self-pay | Admitting: Family Medicine

## 2017-03-29 ENCOUNTER — Ambulatory Visit (INDEPENDENT_AMBULATORY_CARE_PROVIDER_SITE_OTHER): Payer: Medicare HMO | Admitting: Family Medicine

## 2017-03-29 ENCOUNTER — Ambulatory Visit: Payer: Medicare HMO | Admitting: Family Medicine

## 2017-03-29 VITALS — BP 118/76 | HR 79 | Resp 16 | Ht 59.0 in | Wt 108.6 lb

## 2017-03-29 DIAGNOSIS — E559 Vitamin D deficiency, unspecified: Secondary | ICD-10-CM

## 2017-03-29 DIAGNOSIS — N390 Urinary tract infection, site not specified: Secondary | ICD-10-CM

## 2017-03-29 DIAGNOSIS — I10 Essential (primary) hypertension: Secondary | ICD-10-CM | POA: Diagnosis not present

## 2017-03-29 DIAGNOSIS — G4700 Insomnia, unspecified: Secondary | ICD-10-CM | POA: Diagnosis not present

## 2017-03-29 DIAGNOSIS — M1A9XX0 Chronic gout, unspecified, without tophus (tophi): Secondary | ICD-10-CM

## 2017-03-29 DIAGNOSIS — R630 Anorexia: Secondary | ICD-10-CM

## 2017-03-29 DIAGNOSIS — K56600 Partial intestinal obstruction, unspecified as to cause: Secondary | ICD-10-CM | POA: Diagnosis not present

## 2017-03-29 MED ORDER — MIRTAZAPINE 15 MG PO TABS: 15.0000 mg | ORAL_TABLET | Freq: Every day | ORAL | 2 refills | Status: DC

## 2017-03-29 NOTE — Progress Notes (Signed)
Date:  03/29/2017   Name:  Kimberly Abbott   DOB:  1921-03-04   MRN:  [id number]  PCP:  Adline Potter, MD    Chief Complaint: Hypertension and GI Problem [facility] wrote for liquid protonix but insurance will not cover. No one did prior auth but can we order it and see if we can do Prior Auth to get it covered? or a different med like this? )   History of Present Illness:  This is a 83 y.o. female hospitalized for two weeks for partial SBO which resolved eventually with EGD guided NG placement. No recurrent sxs, good BM today, but c/o weak/tired with poor appetite, insomnia, and anxiety. Placed on Protonix at d/c but never filled, rationale unclear, EGD unremarkable. Continues on Ditropan for urge incontinence, cranberry for recurrent UTI, vit D supp, and low dose allopurinol for gout. Completed course clindamycin. Daughter reports no BP problems in hospital except dropped with standing.  Review of Systems:  Review of Systems  Constitutional: Negative for chills and fever.  Respiratory: Negative for cough and shortness of breath.   Cardiovascular: Negative for chest pain and leg swelling.  Gastrointestinal: Negative for abdominal pain, nausea and vomiting.  Neurological: Negative for syncope and light-headedness.    Patient Active Problem List   Diagnosis Date Noted  . Problems with swallowing and mastication   . Protein-calorie malnutrition, severe 03/10/2017  . Partial small bowel obstruction ([place]) 03/06/2017  . History of esophageal stricture 01/29/2017  . Hiatal hernia 01/29/2017  . Dementia 01/29/2017  . Cricopharyngeal achalasia 01/29/2017  . Vitamin D deficiency 01/29/2017  . Gait instability 01/28/2017  . Gout 01/28/2017  . Unintentional weight loss 01/28/2017  . Abnormal EKG 06/08/2016  . Primary osteoarthritis of both hands 05/03/2016  . Chronic fatigue 09/01/2015  . Rotator cuff impingement syndrome of left shoulder 09/01/2015  . Pustular psoriasis of palms and soles  05/05/2015  . Appetite impaired 02/10/2015  . CKD (chronic kidney disease) stage 3, GFR 30-59 ml/min 02/10/2015  . Allergic rhinitis 12/31/2014  . Dermatitis, eczematoid 12/31/2014  . Hyperlipidemia 11/08/2014  . Shortness of breath 11/08/2014  . Anemia 11/08/2014  . Recurrent UTI 11/08/2014  . Depression 11/08/2014  . Gastroesophageal reflux disease without esophagitis 11/08/2014  . Mixed urge and stress incontinence 11/08/2014  . At moderate risk for fall 11/08/2014  . Dysphagia 11/08/2014  . Rotator cuff syndrome of right shoulder 11/08/2014  . Essential hypertension 11/08/2014    Prior to Admission medications   Medication Sig Start Date End Date Taking? Authorizing Provider  allopurinol (ZYLOPRIM) 100 MG tablet Take 100 mg by mouth daily. 05/03/16 05/03/17 Yes [provider]  Cholecalciferol (VITAMIN D3) 2000 units capsule Take 1 capsule (2,000 Units total) by mouth daily. 03/01/17  Yes Mosella Kasa, Gwyndolyn Saxon, MD  Cranberry ([employer] CRANBERRY) 500 MG CAPS Take 1 capsule by mouth daily.    Yes [provider]  feeding supplement, ENSURE ENLIVE, (ENSURE ENLIVE) LIQD Take 237 mLs by mouth 2 (two) times daily between meals. 06/10/16  Yes Fritzi Mandes, MD  oxybutynin (DITROPAN) 5 MG tablet Take 5 mg by mouth 2 (two) times daily.    Yes [provider]  mirtazapine (REMERON) 15 MG tablet Take 1 tablet (15 mg total) by mouth at bedtime. 03/29/17   Adline Potter, MD    Allergies  Allergen Reactions  . Penicillin G Hives  . Penicillins Hives    Has patient had a PCN reaction causing immediate rash, facial/tongue/throat swelling, SOB or lightheadedness with hypotension: No  Has patient had a PCN reaction causing severe rash involving mucus membranes or skin necrosis: No Has patient had a PCN reaction that required hospitalization No Has patient had a PCN reaction occurring within the last 10 years: No If all of the above answers are "NO", then may proceed with Cephalosporin  use.    Past Surgical History:  Procedure Laterality Date  . APPENDECTOMY    . ESOPHAGOGASTRODUODENOSCOPY (EGD) WITH PROPOFOL N/A 03/12/2017   Procedure: ESOPHAGOGASTRODUODENOSCOPY (EGD) WITH PROPOFOL;  Surgeon: Lucilla Lame, MD;  Location: [facility] ENDOSCOPY;  Service: Endoscopy;  Laterality: N/A;  . rectal fistula  in the 40's  . TUBAL LIGATION      Social History  Substance Use Topics  . Smoking status: Former Research scientist (life sciences)  . Smokeless tobacco: Never Used  . Alcohol use No    History reviewed. No pertinent family history.  Medication list has been reviewed and updated.  Physical Examination: BP 118/76   Pulse 79   Resp 16   Ht 4\' 11"  (1.499 m)   Wt 108 lb 9.6 oz (49.3 kg)   SpO2 98%   BMI 21.93 kg/m   Physical Exam  Constitutional: She appears well-developed and well-nourished.  Cardiovascular: Normal rate, regular rhythm and normal heart sounds.   Pulmonary/Chest: Effort normal and breath sounds normal.  Abdominal: Soft. She exhibits no distension. There is no tenderness.  Musculoskeletal: She exhibits no edema.  Neurological: She is alert.  Skin: Skin is warm and dry.  Psychiatric: She has a normal mood and affect. Her behavior is normal.  Nursing note and vitals reviewed.   Assessment and Plan:  1. Partial small bowel obstruction ([place]) Resolved clinically, dx/px discussed  2. Essential hypertension Well controlled today and past visits, will d/c amlodipine as may contribute to decreased GI motility  3. Recurrent UTI Cont cranberry supp  4. Insomnia, unspecified type Restart Remeron 15 mg qhs  5. Appetite impaired Remeron may help  6. Vitamin D deficiency Cont supplement  7. Chronic gout without tophus, unspecified cause, unspecified site Cont low dose allopurinol  Return in about 4 weeks (around 04/26/2017).  [name]. [facility]  03/29/2017

## 2017-04-02 DIAGNOSIS — R69 Illness, unspecified: Secondary | ICD-10-CM | POA: Diagnosis not present

## 2017-04-02 DIAGNOSIS — I129 Hypertensive chronic kidney disease with stage 1 through stage 4 chronic kidney disease, or unspecified chronic kidney disease: Secondary | ICD-10-CM | POA: Diagnosis not present

## 2017-04-02 DIAGNOSIS — G309 Alzheimer's disease, unspecified: Secondary | ICD-10-CM | POA: Diagnosis not present

## 2017-04-02 DIAGNOSIS — N183 Chronic kidney disease, stage 3 (moderate): Secondary | ICD-10-CM | POA: Diagnosis not present

## 2017-04-02 DIAGNOSIS — K5730 Diverticulosis of large intestine without perforation or abscess without bleeding: Secondary | ICD-10-CM | POA: Diagnosis not present

## 2017-04-02 DIAGNOSIS — R1310 Dysphagia, unspecified: Secondary | ICD-10-CM | POA: Diagnosis not present

## 2017-04-02 DIAGNOSIS — E785 Hyperlipidemia, unspecified: Secondary | ICD-10-CM | POA: Diagnosis not present

## 2017-04-02 DIAGNOSIS — M109 Gout, unspecified: Secondary | ICD-10-CM | POA: Diagnosis not present

## 2017-04-04 DIAGNOSIS — G309 Alzheimer's disease, unspecified: Secondary | ICD-10-CM | POA: Diagnosis not present

## 2017-04-04 DIAGNOSIS — M109 Gout, unspecified: Secondary | ICD-10-CM | POA: Diagnosis not present

## 2017-04-04 DIAGNOSIS — I129 Hypertensive chronic kidney disease with stage 1 through stage 4 chronic kidney disease, or unspecified chronic kidney disease: Secondary | ICD-10-CM | POA: Diagnosis not present

## 2017-04-04 DIAGNOSIS — R69 Illness, unspecified: Secondary | ICD-10-CM | POA: Diagnosis not present

## 2017-04-04 DIAGNOSIS — K5730 Diverticulosis of large intestine without perforation or abscess without bleeding: Secondary | ICD-10-CM | POA: Diagnosis not present

## 2017-04-04 DIAGNOSIS — N183 Chronic kidney disease, stage 3 (moderate): Secondary | ICD-10-CM | POA: Diagnosis not present

## 2017-04-04 DIAGNOSIS — E785 Hyperlipidemia, unspecified: Secondary | ICD-10-CM | POA: Diagnosis not present

## 2017-04-04 DIAGNOSIS — R1310 Dysphagia, unspecified: Secondary | ICD-10-CM | POA: Diagnosis not present

## 2017-04-05 ENCOUNTER — Encounter: Payer: Self-pay | Admitting: Podiatry

## 2017-04-05 ENCOUNTER — Ambulatory Visit (INDEPENDENT_AMBULATORY_CARE_PROVIDER_SITE_OTHER): Payer: Medicare HMO | Admitting: Podiatry

## 2017-04-05 DIAGNOSIS — B351 Tinea unguium: Secondary | ICD-10-CM | POA: Diagnosis not present

## 2017-04-05 DIAGNOSIS — M79676 Pain in unspecified toe(s): Secondary | ICD-10-CM | POA: Diagnosis not present

## 2017-04-05 MED ORDER — NAFTIFINE HCL 1 % EX CREA: TOPICAL_CREAM | Freq: Every day | CUTANEOUS | 2 refills | Status: DC

## 2017-04-06 DIAGNOSIS — M109 Gout, unspecified: Secondary | ICD-10-CM | POA: Diagnosis not present

## 2017-04-06 DIAGNOSIS — G309 Alzheimer's disease, unspecified: Secondary | ICD-10-CM | POA: Diagnosis not present

## 2017-04-06 DIAGNOSIS — K5730 Diverticulosis of large intestine without perforation or abscess without bleeding: Secondary | ICD-10-CM | POA: Diagnosis not present

## 2017-04-06 DIAGNOSIS — N183 Chronic kidney disease, stage 3 (moderate): Secondary | ICD-10-CM | POA: Diagnosis not present

## 2017-04-06 DIAGNOSIS — I129 Hypertensive chronic kidney disease with stage 1 through stage 4 chronic kidney disease, or unspecified chronic kidney disease: Secondary | ICD-10-CM | POA: Diagnosis not present

## 2017-04-06 DIAGNOSIS — E785 Hyperlipidemia, unspecified: Secondary | ICD-10-CM | POA: Diagnosis not present

## 2017-04-06 DIAGNOSIS — R1310 Dysphagia, unspecified: Secondary | ICD-10-CM | POA: Diagnosis not present

## 2017-04-06 DIAGNOSIS — R69 Illness, unspecified: Secondary | ICD-10-CM | POA: Diagnosis not present

## 2017-04-08 NOTE — Progress Notes (Signed)
   SUBJECTIVE Patient  presents to office today complaining of elongated, thickened nails. Pain while ambulating in shoes. Patient is unable to trim their own nails.   Past Medical History:  Diagnosis Date  . Allergy   . CKD (chronic kidney disease)   . GERD (gastroesophageal reflux disease)   . Hyperlipidemia   . Hypertension     OBJECTIVE General Patient is awake, alert, and oriented x 3 and in no acute distress. Derm Skin is dry and supple bilateral. Negative open lesions or macerations. Remaining integument unremarkable. Nails are tender, long, thickened and dystrophic with subungual debris, consistent with onychomycosis, 1-5 bilateral. No signs of infection noted. Vasc  DP and PT pedal pulses palpable bilaterally. Temperature gradient within normal limits.  Neuro Epicritic and protective threshold sensation diminished bilaterally.  Musculoskeletal Exam No symptomatic pedal deformities noted bilateral. Muscular strength within normal limits.  ASSESSMENT 1. Onychodystrophic nails 1-5 bilateral with hyperkeratosis of nails.  2. Onychomycosis of nail due to dermatophyte bilateral 3. Pain in foot bilateral  PLAN OF CARE 1. Patient evaluated today.  2. Instructed to maintain good pedal hygiene and foot care.  3. Mechanical debridement of nails 1-5 bilaterally performed using a nail nipper. Filed with dremel without incident.  4. Refill Naftin 2% cream for chronic tinea pedis of the right foot. 5. Return to clinic in 3 mos.    Edrick Kins, DPM [facility]  Dr. Edrick Kins, [address]                                        [place],  [address]                Office [phone]  Fax [phone]

## 2017-04-09 DIAGNOSIS — N183 Chronic kidney disease, stage 3 (moderate): Secondary | ICD-10-CM | POA: Diagnosis not present

## 2017-04-09 DIAGNOSIS — R1310 Dysphagia, unspecified: Secondary | ICD-10-CM | POA: Diagnosis not present

## 2017-04-09 DIAGNOSIS — R69 Illness, unspecified: Secondary | ICD-10-CM | POA: Diagnosis not present

## 2017-04-09 DIAGNOSIS — M109 Gout, unspecified: Secondary | ICD-10-CM | POA: Diagnosis not present

## 2017-04-09 DIAGNOSIS — G309 Alzheimer's disease, unspecified: Secondary | ICD-10-CM | POA: Diagnosis not present

## 2017-04-09 DIAGNOSIS — E785 Hyperlipidemia, unspecified: Secondary | ICD-10-CM | POA: Diagnosis not present

## 2017-04-09 DIAGNOSIS — I129 Hypertensive chronic kidney disease with stage 1 through stage 4 chronic kidney disease, or unspecified chronic kidney disease: Secondary | ICD-10-CM | POA: Diagnosis not present

## 2017-04-09 DIAGNOSIS — K5730 Diverticulosis of large intestine without perforation or abscess without bleeding: Secondary | ICD-10-CM | POA: Diagnosis not present

## 2017-04-11 DIAGNOSIS — G309 Alzheimer's disease, unspecified: Secondary | ICD-10-CM | POA: Diagnosis not present

## 2017-04-11 DIAGNOSIS — R69 Illness, unspecified: Secondary | ICD-10-CM | POA: Diagnosis not present

## 2017-04-11 DIAGNOSIS — M109 Gout, unspecified: Secondary | ICD-10-CM | POA: Diagnosis not present

## 2017-04-11 DIAGNOSIS — N183 Chronic kidney disease, stage 3 (moderate): Secondary | ICD-10-CM | POA: Diagnosis not present

## 2017-04-11 DIAGNOSIS — K5730 Diverticulosis of large intestine without perforation or abscess without bleeding: Secondary | ICD-10-CM | POA: Diagnosis not present

## 2017-04-11 DIAGNOSIS — I129 Hypertensive chronic kidney disease with stage 1 through stage 4 chronic kidney disease, or unspecified chronic kidney disease: Secondary | ICD-10-CM | POA: Diagnosis not present

## 2017-04-11 DIAGNOSIS — R1310 Dysphagia, unspecified: Secondary | ICD-10-CM | POA: Diagnosis not present

## 2017-04-11 DIAGNOSIS — Z6821 Body mass index (BMI) 21.0-21.9, adult: Secondary | ICD-10-CM | POA: Diagnosis not present

## 2017-04-11 DIAGNOSIS — E785 Hyperlipidemia, unspecified: Secondary | ICD-10-CM | POA: Diagnosis not present

## 2017-04-11 DIAGNOSIS — R8290 Unspecified abnormal findings in urine: Secondary | ICD-10-CM | POA: Diagnosis not present

## 2017-04-16 DIAGNOSIS — M109 Gout, unspecified: Secondary | ICD-10-CM | POA: Diagnosis not present

## 2017-04-16 DIAGNOSIS — I129 Hypertensive chronic kidney disease with stage 1 through stage 4 chronic kidney disease, or unspecified chronic kidney disease: Secondary | ICD-10-CM | POA: Diagnosis not present

## 2017-04-16 DIAGNOSIS — R1310 Dysphagia, unspecified: Secondary | ICD-10-CM | POA: Diagnosis not present

## 2017-04-16 DIAGNOSIS — N183 Chronic kidney disease, stage 3 (moderate): Secondary | ICD-10-CM | POA: Diagnosis not present

## 2017-04-16 DIAGNOSIS — R69 Illness, unspecified: Secondary | ICD-10-CM | POA: Diagnosis not present

## 2017-04-16 DIAGNOSIS — G309 Alzheimer's disease, unspecified: Secondary | ICD-10-CM | POA: Diagnosis not present

## 2017-04-16 DIAGNOSIS — K5730 Diverticulosis of large intestine without perforation or abscess without bleeding: Secondary | ICD-10-CM | POA: Diagnosis not present

## 2017-04-16 DIAGNOSIS — E785 Hyperlipidemia, unspecified: Secondary | ICD-10-CM | POA: Diagnosis not present

## 2017-04-18 DIAGNOSIS — I129 Hypertensive chronic kidney disease with stage 1 through stage 4 chronic kidney disease, or unspecified chronic kidney disease: Secondary | ICD-10-CM | POA: Diagnosis not present

## 2017-04-18 DIAGNOSIS — K5730 Diverticulosis of large intestine without perforation or abscess without bleeding: Secondary | ICD-10-CM | POA: Diagnosis not present

## 2017-04-18 DIAGNOSIS — R1310 Dysphagia, unspecified: Secondary | ICD-10-CM | POA: Diagnosis not present

## 2017-04-18 DIAGNOSIS — R69 Illness, unspecified: Secondary | ICD-10-CM | POA: Diagnosis not present

## 2017-04-18 DIAGNOSIS — M109 Gout, unspecified: Secondary | ICD-10-CM | POA: Diagnosis not present

## 2017-04-18 DIAGNOSIS — E785 Hyperlipidemia, unspecified: Secondary | ICD-10-CM | POA: Diagnosis not present

## 2017-04-18 DIAGNOSIS — G309 Alzheimer's disease, unspecified: Secondary | ICD-10-CM | POA: Diagnosis not present

## 2017-04-18 DIAGNOSIS — N183 Chronic kidney disease, stage 3 (moderate): Secondary | ICD-10-CM | POA: Diagnosis not present

## 2017-04-19 ENCOUNTER — Other Ambulatory Visit: Payer: Self-pay | Admitting: Family Medicine

## 2017-04-19 ENCOUNTER — Telehealth: Payer: Self-pay

## 2017-04-19 MED ORDER — SULFAMETHOXAZOLE-TRIMETHOPRIM 800-160 MG PO TABS: 1.0000 | ORAL_TABLET | Freq: Two times a day (BID) | ORAL | 0 refills | Status: DC

## 2017-04-19 NOTE — Telephone Encounter (Signed)
Rx sent 

## 2017-04-19 NOTE — Telephone Encounter (Signed)
Saw Kidney doctor Demetrius Charity and they gave her antibiotic that starts with L. Patient and daughter do not like side effects and want the Bactrim like she took 03/01/2017 that we sent in. Wants to know if you can call in another Rx for that to [facility].

## 2017-04-23 ENCOUNTER — Ambulatory Visit: Payer: Medicare HMO | Admitting: Family Medicine

## 2017-04-24 ENCOUNTER — Encounter: Payer: Self-pay | Admitting: Family Medicine

## 2017-04-24 ENCOUNTER — Ambulatory Visit (INDEPENDENT_AMBULATORY_CARE_PROVIDER_SITE_OTHER): Payer: Medicare HMO | Admitting: Family Medicine

## 2017-04-24 VITALS — BP 122/78 | HR 68 | Resp 16 | Ht 59.0 in | Wt 106.3 lb

## 2017-04-24 DIAGNOSIS — R69 Illness, unspecified: Secondary | ICD-10-CM | POA: Diagnosis not present

## 2017-04-24 DIAGNOSIS — F5101 Primary insomnia: Secondary | ICD-10-CM | POA: Diagnosis not present

## 2017-04-24 DIAGNOSIS — N3946 Mixed incontinence: Secondary | ICD-10-CM | POA: Diagnosis not present

## 2017-04-24 DIAGNOSIS — R8290 Unspecified abnormal findings in urine: Secondary | ICD-10-CM | POA: Diagnosis not present

## 2017-04-24 DIAGNOSIS — G4700 Insomnia, unspecified: Secondary | ICD-10-CM | POA: Insufficient documentation

## 2017-04-24 DIAGNOSIS — N12 Tubulo-interstitial nephritis, not specified as acute or chronic: Secondary | ICD-10-CM | POA: Diagnosis not present

## 2017-04-24 DIAGNOSIS — M1A9XX0 Chronic gout, unspecified, without tophus (tophi): Secondary | ICD-10-CM

## 2017-04-24 LAB — POCT URINALYSIS DIPSTICK
BILIRUBIN UA: NEGATIVE
GLUCOSE UA: NEGATIVE
KETONES UA: NEGATIVE
NITRITE UA: POSITIVE
Protein, UA: NEGATIVE
SPEC GRAV UA: 1.01 (ref 1.010–1.025)
Urobilinogen, UA: 0.2 E.U./dL
pH, UA: 6.5 (ref 5.0–8.0)

## 2017-04-24 MED ORDER — CEPHALEXIN 250 MG/5ML PO SUSR: 500.0000 mg | Freq: Two times a day (BID) | ORAL | 0 refills | Status: DC

## 2017-04-24 MED ORDER — TRAMADOL HCL 50 MG PO TABS: 50.0000 mg | ORAL_TABLET | Freq: Three times a day (TID) | ORAL | 0 refills | Status: DC | PRN

## 2017-04-24 NOTE — Progress Notes (Signed)
Date:  04/24/2017   Name:  Kimberly Abbott   DOB:  12/17/1920   MRN:  [id number]  PCP:  Adline Potter, MD    Chief Complaint: Flank Pain (severe pain on right side no dysuria but frequent urination at night. )   History of Present Illness:  This is a 83 y.o. female seen for R flank pain x 1 week, given Levaquin by nephro but never took, did take Bactrim x 3d last week without benefit. Urine cx from nephro visit showed E. Coli resistant to Bactrim. Has run out of Ditropan, not sleeping well despite Remeron due to pain, no gout flare or recurrence GI sxs.  Review of Systems:  Review of Systems  Constitutional: Negative for chills and fever.  Respiratory: Negative for cough and shortness of breath.   Cardiovascular: Negative for chest pain and leg swelling.  Gastrointestinal: Negative for abdominal pain.  Genitourinary: Negative for difficulty urinating.  Neurological: Negative for syncope and light-headedness.    Patient Active Problem List   Diagnosis Date Noted  . Insomnia 04/24/2017  . Problems with swallowing and mastication   . Protein-calorie malnutrition, severe 03/10/2017  . SBO (small bowel obstruction) ([place]) 03/06/2017  . History of esophageal stricture 01/29/2017  . Hiatal hernia 01/29/2017  . Dementia 01/29/2017  . Cricopharyngeal achalasia 01/29/2017  . Vitamin D deficiency 01/29/2017  . Gait instability 01/28/2017  . Gout 01/28/2017  . Unintentional weight loss 01/28/2017  . Abnormal EKG 06/08/2016  . Primary osteoarthritis of both hands 05/03/2016  . Chronic fatigue 09/01/2015  . Rotator cuff impingement syndrome of left shoulder 09/01/2015  . Pustular psoriasis of palms and soles 05/05/2015  . Appetite impaired 02/10/2015  . Chronic kidney disease, stage III (moderate) 02/10/2015  . Allergic rhinitis 12/31/2014  . Dermatitis, eczematoid 12/31/2014  . Hyperlipidemia 11/08/2014  . Shortness of breath 11/08/2014  . Anemia 11/08/2014  . Recurrent UTI  11/08/2014  . Depression 11/08/2014  . Gastroesophageal reflux disease without esophagitis 11/08/2014  . Mixed urge and stress incontinence 11/08/2014  . At moderate risk for fall 11/08/2014  . Dysphagia 11/08/2014  . Rotator cuff syndrome of right shoulder 11/08/2014  . Essential hypertension 11/08/2014    Prior to Admission medications   Medication Sig Start Date End Date Taking? Authorizing Provider  allopurinol (ZYLOPRIM) 100 MG tablet Take 100 mg by mouth daily.   Yes [provider]  feeding supplement, ENSURE ENLIVE, (ENSURE ENLIVE) LIQD Take 237 mLs by mouth 2 (two) times daily between meals. 06/10/16  Yes Fritzi Mandes, MD  mirtazapine (REMERON) 15 MG tablet Take 15 mg by mouth at bedtime.   Yes [provider]  oxybutynin (DITROPAN) 5 MG tablet Take 5 mg by mouth 2 (two) times daily.   Yes [provider]  cephALEXin (KEFLEX) 250 MG/5ML suspension Take 10 mLs (500 mg total) by mouth 2 (two) times daily. 04/24/17   Brendan Gruwell, Gwyndolyn Saxon, MD  traMADol (ULTRAM) 50 MG tablet Take 1 tablet (50 mg total) by mouth every 8 (eight) hours as needed. 04/24/17   Adline Potter, MD    Allergies  Allergen Reactions  . Penicillin G Hives  . Penicillins Hives    Has patient had a PCN reaction causing immediate rash, facial/tongue/throat swelling, SOB or lightheadedness with hypotension: No Has patient had a PCN reaction causing severe rash involving mucus membranes or skin necrosis: No Has patient had a PCN reaction that required hospitalization No Has patient had a PCN reaction occurring within the last 10 years: No If  all of the above answers are "NO", then may proceed with Cephalosporin use.    Past Surgical History:  Procedure Laterality Date  . APPENDECTOMY    . ESOPHAGOGASTRODUODENOSCOPY (EGD) WITH PROPOFOL N/A 03/12/2017   Procedure: ESOPHAGOGASTRODUODENOSCOPY (EGD) WITH PROPOFOL;  Surgeon: Lucilla Lame, MD;  Location: [facility] ENDOSCOPY;  Service: Endoscopy;   Laterality: N/A;  . rectal fistula  in the 40's  . TUBAL LIGATION      Social History  Substance Use Topics  . Smoking status: Former Research scientist (life sciences)  . Smokeless tobacco: Never Used  . Alcohol use No    History reviewed. No pertinent family history.  Medication list has been reviewed and updated.  Physical Examination: BP 122/78   Pulse 68   Resp 16   Ht 4\' 11"  (1.499 m)   Wt 106 lb 4.8 oz (48.2 kg)   SpO2 98%   BMI 21.47 kg/m   Physical Exam  Constitutional: She appears well-developed and well-nourished.  Cardiovascular: Normal rate, regular rhythm and normal heart sounds.   Pulmonary/Chest: Effort normal and breath sounds normal.  Genitourinary:  Genitourinary Comments: Marked R CVAT, mild L CVAT, no suprapubic tenderness  Musculoskeletal: She exhibits no edema.  Neurological: She is alert.  Skin: Skin is warm and dry.  Psychiatric: She has a normal mood and affect. Her behavior is normal.  Nursing note and vitals reviewed.   Assessment and Plan:  1. Pyelonephritis Keflex 500 mg bid x 5d (sensitive on last cx, pcn allergy but rash only), tramadol prn pain #10 only, call if sxs worsen/persist - Urine Culture  2. Bad odor of urine UA shows sm blood, lg leuk, pos nit - POCT urinalysis dipstick  3. Mixed urge and stress incontinence Hold Ditropan for now, may be contributing to recurrent UTI  4. Chronic gout without tophus, unspecified cause, unspecified site Cont allopurinol  5. Primary insomnia Cont Remeron  Return if symptoms worsen or fail to improve.  [name]. [facility]  04/24/2017

## 2017-04-26 ENCOUNTER — Ambulatory Visit: Payer: Medicare HMO | Admitting: Family Medicine

## 2017-04-30 ENCOUNTER — Other Ambulatory Visit: Payer: Self-pay | Admitting: Family Medicine

## 2017-04-30 ENCOUNTER — Emergency Department: Payer: Medicare HMO

## 2017-04-30 ENCOUNTER — Inpatient Hospital Stay
Admission: EM | Admit: 2017-04-30 | Discharge: 2017-05-02 | DRG: 372 | Disposition: A | Discharge status: Home Health | Payer: Medicare HMO | Attending: Internal Medicine | Admitting: Internal Medicine

## 2017-04-30 ENCOUNTER — Encounter: Payer: Self-pay | Admitting: *Deleted

## 2017-04-30 ENCOUNTER — Telehealth: Payer: Self-pay

## 2017-04-30 DIAGNOSIS — Z880 Allergy status to penicillin: Secondary | ICD-10-CM

## 2017-04-30 DIAGNOSIS — R197 Diarrhea, unspecified: Secondary | ICD-10-CM | POA: Diagnosis not present

## 2017-04-30 DIAGNOSIS — N183 Chronic kidney disease, stage 3 (moderate): Secondary | ICD-10-CM | POA: Diagnosis present

## 2017-04-30 DIAGNOSIS — Z87891 Personal history of nicotine dependence: Secondary | ICD-10-CM

## 2017-04-30 DIAGNOSIS — K5669 Other partial intestinal obstruction: Secondary | ICD-10-CM | POA: Diagnosis not present

## 2017-04-30 DIAGNOSIS — A0472 Enterocolitis due to Clostridium difficile, not specified as recurrent: Secondary | ICD-10-CM | POA: Diagnosis not present

## 2017-04-30 DIAGNOSIS — K219 Gastro-esophageal reflux disease without esophagitis: Secondary | ICD-10-CM | POA: Diagnosis present

## 2017-04-30 DIAGNOSIS — I10 Essential (primary) hypertension: Secondary | ICD-10-CM | POA: Diagnosis not present

## 2017-04-30 DIAGNOSIS — N179 Acute kidney failure, unspecified: Secondary | ICD-10-CM | POA: Diagnosis not present

## 2017-04-30 DIAGNOSIS — K6389 Other specified diseases of intestine: Secondary | ICD-10-CM | POA: Diagnosis not present

## 2017-04-30 DIAGNOSIS — I129 Hypertensive chronic kidney disease with stage 1 through stage 4 chronic kidney disease, or unspecified chronic kidney disease: Secondary | ICD-10-CM | POA: Diagnosis not present

## 2017-04-30 DIAGNOSIS — E871 Hypo-osmolality and hyponatremia: Secondary | ICD-10-CM | POA: Diagnosis not present

## 2017-04-30 DIAGNOSIS — E785 Hyperlipidemia, unspecified: Secondary | ICD-10-CM | POA: Diagnosis not present

## 2017-04-30 DIAGNOSIS — N390 Urinary tract infection, site not specified: Secondary | ICD-10-CM | POA: Diagnosis present

## 2017-04-30 DIAGNOSIS — R9431 Abnormal electrocardiogram [ECG] [EKG]: Secondary | ICD-10-CM | POA: Diagnosis not present

## 2017-04-30 DIAGNOSIS — K56600 Partial intestinal obstruction, unspecified as to cause: Secondary | ICD-10-CM | POA: Diagnosis not present

## 2017-04-30 DIAGNOSIS — Z79899 Other long term (current) drug therapy: Secondary | ICD-10-CM | POA: Diagnosis not present

## 2017-04-30 DIAGNOSIS — A09 Infectious gastroenteritis and colitis, unspecified: Secondary | ICD-10-CM | POA: Diagnosis not present

## 2017-04-30 LAB — GASTROINTESTINAL PANEL BY PCR, STOOL (REPLACES STOOL CULTURE)
ADENOVIRUS F40/41: NOT DETECTED
ASTROVIRUS: NOT DETECTED
CRYPTOSPORIDIUM: NOT DETECTED
CYCLOSPORA CAYETANENSIS: NOT DETECTED
Campylobacter species: NOT DETECTED
ENTAMOEBA HISTOLYTICA: NOT DETECTED
ENTEROPATHOGENIC E COLI (EPEC): NOT DETECTED
ENTEROTOXIGENIC E COLI (ETEC): NOT DETECTED
Enteroaggregative E coli (EAEC): NOT DETECTED
Giardia lamblia: NOT DETECTED
Norovirus GI/GII: NOT DETECTED
Plesimonas shigelloides: NOT DETECTED
Rotavirus A: NOT DETECTED
SAPOVIRUS (I, II, IV, AND V): NOT DETECTED
Salmonella species: NOT DETECTED
Shiga like toxin producing E coli (STEC): NOT DETECTED
Shigella/Enteroinvasive E coli (EIEC): NOT DETECTED
VIBRIO CHOLERAE: NOT DETECTED
VIBRIO SPECIES: NOT DETECTED
YERSINIA ENTEROCOLITICA: NOT DETECTED

## 2017-04-30 LAB — CBC
HEMATOCRIT: 38.2 % (ref 35.0–47.0)
Hemoglobin: 12.9 g/dL (ref 12.0–16.0)
MCH: 30 pg (ref 26.0–34.0)
MCHC: 33.7 g/dL (ref 32.0–36.0)
MCV: 89 fL (ref 80.0–100.0)
PLATELETS: 282 10*3/uL (ref 150–440)
RBC: 4.29 MIL/uL (ref 3.80–5.20)
RDW: 16.3 % — AB (ref 11.5–14.5)
WBC: 9.9 10*3/uL (ref 3.6–11.0)

## 2017-04-30 LAB — URINE CULTURE

## 2017-04-30 LAB — URINALYSIS, COMPLETE (UACMP) WITH MICROSCOPIC
BILIRUBIN URINE: NEGATIVE
Glucose, UA: NEGATIVE mg/dL
Hgb urine dipstick: NEGATIVE
KETONES UR: NEGATIVE mg/dL
Nitrite: NEGATIVE
PROTEIN: NEGATIVE mg/dL
SPECIFIC GRAVITY, URINE: 1.014 (ref 1.005–1.030)
pH: 5 (ref 5.0–8.0)

## 2017-04-30 LAB — COMPREHENSIVE METABOLIC PANEL
ALBUMIN: 3.7 g/dL (ref 3.5–5.0)
ALT: 13 U/L — AB (ref 14–54)
AST: 22 U/L (ref 15–41)
Alkaline Phosphatase: 117 U/L (ref 38–126)
Anion gap: 10 (ref 5–15)
BUN: 22 mg/dL — AB (ref 6–20)
CHLORIDE: 99 mmol/L — AB (ref 101–111)
CO2: 27 mmol/L (ref 22–32)
CREATININE: 1.16 mg/dL — AB (ref 0.44–1.00)
Calcium: 10.2 mg/dL (ref 8.9–10.3)
GFR calc Af Amer: 45 mL/min — ABNORMAL LOW (ref >60)
GFR calc non Af Amer: 39 mL/min — ABNORMAL LOW (ref >60)
GLUCOSE: 121 mg/dL — AB (ref 65–99)
POTASSIUM: 4 mmol/L (ref 3.5–5.1)
Sodium: 136 mmol/L (ref 135–145)
Total Bilirubin: 0.5 mg/dL (ref 0.3–1.2)
Total Protein: 7.4 g/dL (ref 6.5–8.1)

## 2017-04-30 LAB — C DIFFICILE QUICK SCREEN W PCR REFLEX
C DIFFICLE (CDIFF) ANTIGEN: POSITIVE — AB
C Diff toxin: NEGATIVE

## 2017-04-30 LAB — TROPONIN I: Troponin I: 0.03 ng/mL (ref <0.03)

## 2017-04-30 LAB — LIPASE, BLOOD: LIPASE: 17 U/L (ref 11–51)

## 2017-04-30 LAB — CLOSTRIDIUM DIFFICILE BY PCR: Toxigenic C. Difficile by PCR: POSITIVE — AB

## 2017-04-30 MED ORDER — CEPHALEXIN 250 MG/5ML PO SUSR: 500.0000 mg | Freq: Two times a day (BID) | ORAL | 0 refills | Status: DC

## 2017-04-30 MED ORDER — IOPAMIDOL (ISOVUE-300) INJECTION 61%
30.0000 mL | Freq: Once | INTRAVENOUS | Status: AC | PRN
  Administered 2017-04-30: 30 mL via ORAL

## 2017-04-30 MED ORDER — SODIUM CHLORIDE 0.9 % IV BOLUS (SEPSIS)
1000.0000 mL | Freq: Once | INTRAVENOUS | Status: AC
  Administered 2017-04-30: 1000 mL via INTRAVENOUS

## 2017-04-30 MED ORDER — METRONIDAZOLE IN NACL 5-0.79 MG/ML-% IV SOLN
500.0000 mg | Freq: Once | INTRAVENOUS | Status: AC
  Administered 2017-04-30: 500 mg via INTRAVENOUS
  Filled 2017-04-30: qty 100

## 2017-04-30 MED ORDER — IOPAMIDOL (ISOVUE-300) INJECTION 61%
75.0000 mL | Freq: Once | INTRAVENOUS | Status: AC | PRN
  Administered 2017-04-30: 75 mL via INTRAVENOUS

## 2017-04-30 NOTE — Telephone Encounter (Signed)
Medication given for UTI only last 3 days. Patient is no better and vomited black liquid up. What next?

## 2017-04-30 NOTE — Telephone Encounter (Signed)
Urine cx shows bacteria sensitive to all abxs except Bactrim so Keflex should cover. I'll send in anther 5d rx, let us know if she doesn't tolerate so we can use something else.

## 2017-04-30 NOTE — ED Provider Notes (Addendum)
[facility] Emergency Department Provider Note  Time seen: 6:53 PM  I have reviewed the triage vital signs and the nursing notes.   HISTORY  Chief Complaint Diarrhea    HPI Kimberly Abbott is a 83 y.o. female With a past medical history of CK D, gastric reflux, hypertension, hyperlipidemia, presents to the emergency department for abdominal pain nausea vomiting and diarrhea. According to family since yesterday the patient has been nauseated with intermittent episodes of vomiting complaining of lower abdominal discomfort and diarrhea. Patient has mild dementia and is not the best historian but the family provides an accurate history. Currently the patient denies any symptoms, family states she last had an episode of diarrhea approximately one hour ago. Denies any fever. Patient denies any chest pain, shortness breath, dysuria.  Past Medical History:  Diagnosis Date  . Allergy   . CKD (chronic kidney disease)   . GERD (gastroesophageal reflux disease)   . Hyperlipidemia   . Hypertension     Patient Active Problem List   Diagnosis Date Noted  . Insomnia 04/24/2017  . Problems with swallowing and mastication   . Protein-calorie malnutrition, severe 03/10/2017  . SBO (small bowel obstruction) ([place]) 03/06/2017  . History of esophageal stricture 01/29/2017  . Hiatal hernia 01/29/2017  . Dementia 01/29/2017  . Cricopharyngeal achalasia 01/29/2017  . Vitamin D deficiency 01/29/2017  . Gait instability 01/28/2017  . Gout 01/28/2017  . Unintentional weight loss 01/28/2017  . Abnormal EKG 06/08/2016  . Primary osteoarthritis of both hands 05/03/2016  . Chronic fatigue 09/01/2015  . Rotator cuff impingement syndrome of left shoulder 09/01/2015  . Pustular psoriasis of palms and soles 05/05/2015  . Appetite impaired 02/10/2015  . Chronic kidney disease, stage III (moderate) ([place]) 02/10/2015  . Allergic rhinitis 12/31/2014  . Dermatitis, eczematoid 12/31/2014   . Hyperlipidemia 11/08/2014  . Shortness of breath 11/08/2014  . Anemia 11/08/2014  . Recurrent UTI 11/08/2014  . Depression 11/08/2014  . Gastroesophageal reflux disease without esophagitis 11/08/2014  . Mixed urge and stress incontinence 11/08/2014  . At moderate risk for fall 11/08/2014  . Dysphagia 11/08/2014  . Rotator cuff syndrome of right shoulder 11/08/2014  . Essential hypertension 11/08/2014    Past Surgical History:  Procedure Laterality Date  . APPENDECTOMY    . ESOPHAGOGASTRODUODENOSCOPY (EGD) WITH PROPOFOL N/A 03/12/2017   Procedure: ESOPHAGOGASTRODUODENOSCOPY (EGD) WITH PROPOFOL;  Surgeon: Lucilla Lame, MD;  Location: [facility] ENDOSCOPY;  Service: Endoscopy;  Laterality: N/A;  . rectal fistula  in the 40's  . TUBAL LIGATION      Prior to Admission medications   Medication Sig Start Date End Date Taking? Authorizing Provider  cephALEXin (KEFLEX) 250 MG/5ML suspension Take 10 mLs (500 mg total) by mouth 2 (two) times daily. 04/30/17  Yes Plonk, Gwyndolyn Saxon, MD  mirtazapine (REMERON) 15 MG tablet Take 15 mg by mouth at bedtime.   Yes [provider]  oxybutynin (DITROPAN) 5 MG tablet Take 5 mg by mouth 2 (two) times daily.   Yes [provider]  allopurinol (ZYLOPRIM) 100 MG tablet Take 100 mg by mouth daily.    [provider]  feeding supplement, ENSURE ENLIVE, (ENSURE ENLIVE) LIQD Take 237 mLs by mouth 2 (two) times daily between meals. 06/10/16   Fritzi Mandes, MD  traMADol (ULTRAM) 50 MG tablet Take 1 tablet (50 mg total) by mouth every 8 (eight) hours as needed. 04/24/17   Adline Potter, MD    Allergies  Allergen Reactions  . Penicillin G Hives  .  Penicillins Hives    Has patient had a PCN reaction causing immediate rash, facial/tongue/throat swelling, SOB or lightheadedness with hypotension: No Has patient had a PCN reaction causing severe rash involving mucus membranes or skin necrosis: No Has patient had a PCN reaction that required  hospitalization No Has patient had a PCN reaction occurring within the last 10 years: No If all of the above answers are "NO", then may proceed with Cephalosporin use.    No family history on file.  Social History Social History  Substance Use Topics  . Smoking status: Former Research scientist (life sciences)  . Smokeless tobacco: Never Used  . Alcohol use No    Review of Systems Constitutional: Negative for fever. ENT: Negative for congestion Cardiovascular: Negative for chest pain. Respiratory: Negative for shortness of breath. Gastrointestinal: left lower quadrant abdominal pain. Positive for nausea vomiting and diarrhea Genitourinary: Negative for dysuria All other ROS negative, although possibly somewhat limited due to baseline dementia. ____________________________________________   PHYSICAL EXAM:  VITAL SIGNS: ED Triage Vitals  Enc Vitals Group     BP 04/30/17 1731 (!) 147/67     Pulse Rate 04/30/17 1731 87     Resp 04/30/17 1731 20     Temp 04/30/17 1731 98.2 F (36.8 C)     Temp Source 04/30/17 1731 Oral     SpO2 04/30/17 1731 99 %     Weight 04/30/17 1729 105 lb (47.6 kg)     Height 04/30/17 1729 5\' 4"  (1.626 m)     Head Circumference --      Peak Flow --      Pain Score 04/30/17 1729 8     Pain Loc --      Pain Edu? --      Excl. in [place]? --     Constitutional: Alert. Well appearing and in no distress.lying comfortably in bed, answering questions appropriately. Eyes: Normal exam ENT   Head: Normocephalic and atraumatic.   Mouth/Throat: Mucous membranes are moist. Cardiovascular: Normal rate, regular rhythm.  Respiratory: Normal respiratory effort without tachypnea nor retractions. Breath sounds are clear  Gastrointestinal: soft, mild left lower quadrant abdominal tenderness to palpation. No rebound or guarding. No distention. Musculoskeletal: Nontender with normal range of motion in all extremities.  Neurologic:  Normal speech and language. No gross focal neurologic  deficits  Skin:  Skin is warm, dry and intact.  Psychiatric: Mood and affect are normal.   ____________________________________________    EKG  EKG reviewed and interpreted by myself shows normal sinus rhythm at 86 bpm, narrow QRS, normal axis, normal intervals, nonspecific but no concerning ST changes.  ____________________________________________    RADIOLOGY  IMPRESSION: Multiple mildly dilated air and contrast filled small bowel loops without change from the prior exam. Findings may be due to a chronic partial small bowel obstruction versus ileus.  Mild patchy opacification in the lung bases with bronchiectasis. Findings may be due to atelectasis or early infection.  2 cm left renal cyst as well as multiple small bilateral subcentimeter renal cortical hypodensities too small to characterize but likely cysts.  Mild stable chronic prominence of the central intrahepatic ducts and common bile duct.  Moderate diverticulosis of the colon most involving the sigmoid colon.  ____________________________________________   INITIAL IMPRESSION / ASSESSMENT AND PLAN / ED COURSE  Pertinent labs & imaging results that were available during my care of the patient were reviewed by me and considered in my medical decision making (see chart for details).  patient present to the emergency department  for abdominal discomfort in the left lower quadrant, nausea vomiting and diarrhea. Differential this time is quite vast but would include colitis, diverticulitis, gastroenteritis, less likely ACS, C. difficile. We will check labs and closely monitor.  Labs are largely within normal limits. Normal white blood cell count. Troponin slightly elevated 0.03, EKG shows no acute/concerning findings. Patient denies any chest pain or trouble breathing. Given the patient's mild left lower quadrant tenderness to palpation with nausea vomiting and diarrhea we'll obtain a CT scan of the abdomen to further  evaluate. Family agreeable to plan. Urinalysis pending.  CT scan shows possible continued small bowel obstruction but large unchanged from last month when the patient was admitted for small bowel obstruction. C. difficile testing is positive. We will begin IV Flagyl. Given the patient's age and comorbidities as well as significant amounts of diarrhea we'll admit to the hospital for further treatment. Patient agreeable to plan.  patient's records have been reviewed by myself including her discharge summary from recent hospital admission in August.  ____________________________________________   FINAL CLINICAL IMPRESSION(S) / ED DIAGNOSES  left lower quadrant pain C. difficile colitis   Harvest Dark, MD 04/30/17 2252    Harvest Dark, MD 04/30/17 0626    Harvest Dark, MD 04/30/17 2257

## 2017-04-30 NOTE — ED Triage Notes (Signed)
Pt to triage via wheelchair.  Pt sent from [place] for eval of diarrhea.  Sx for 2 days.    Pt alert.

## 2017-04-30 NOTE — ED Notes (Signed)
Date and time results received: 04/30/17 1825 (use smartphrase ".now" to insert current time)  Test: troponin Critical Value: 0.03  Name of Provider Notified: Paduchowski  Orders Received? Or Actions Taken?: acknowledged

## 2017-05-01 ENCOUNTER — Encounter: Payer: Self-pay | Admitting: Internal Medicine

## 2017-05-01 LAB — CBC
HCT: 31.3 % — ABNORMAL LOW (ref 35.0–47.0)
HEMOGLOBIN: 10.7 g/dL — AB (ref 12.0–16.0)
MCH: 29.9 pg (ref 26.0–34.0)
MCHC: 34.3 g/dL (ref 32.0–36.0)
MCV: 87.3 fL (ref 80.0–100.0)
PLATELETS: 208 10*3/uL (ref 150–440)
RBC: 3.58 MIL/uL — AB (ref 3.80–5.20)
RDW: 15.7 % — ABNORMAL HIGH (ref 11.5–14.5)
WBC: 7.3 10*3/uL (ref 3.6–11.0)

## 2017-05-01 LAB — BASIC METABOLIC PANEL
Anion gap: 4 — ABNORMAL LOW (ref 5–15)
BUN: 17 mg/dL (ref 6–20)
CALCIUM: 8.8 mg/dL — AB (ref 8.9–10.3)
CHLORIDE: 109 mmol/L (ref 101–111)
CO2: 25 mmol/L (ref 22–32)
CREATININE: 1.09 mg/dL — AB (ref 0.44–1.00)
GFR calc Af Amer: 49 mL/min — ABNORMAL LOW (ref >60)
GFR calc non Af Amer: 42 mL/min — ABNORMAL LOW (ref >60)
GLUCOSE: 98 mg/dL (ref 65–99)
Potassium: 3.4 mmol/L — ABNORMAL LOW (ref 3.5–5.1)
Sodium: 138 mmol/L (ref 135–145)

## 2017-05-01 MED ORDER — MIRTAZAPINE 15 MG PO TABS
15.0000 mg | ORAL_TABLET | Freq: Every day | ORAL | Status: DC
  Administered 2017-05-01: 15 mg via ORAL
  Filled 2017-05-01: qty 1

## 2017-05-01 MED ORDER — ENOXAPARIN SODIUM 30 MG/0.3ML ~~LOC~~ SOLN
30.0000 mg | SUBCUTANEOUS | Status: DC
  Administered 2017-05-01: 30 mg via SUBCUTANEOUS
  Filled 2017-05-01: qty 0.3

## 2017-05-01 MED ORDER — SODIUM CHLORIDE 0.9 % IV SOLN
INTRAVENOUS | Status: DC
  Administered 2017-05-01 (×2): via INTRAVENOUS

## 2017-05-01 MED ORDER — AMLODIPINE BESYLATE 10 MG PO TABS
10.0000 mg | ORAL_TABLET | Freq: Every day | ORAL | Status: DC
  Administered 2017-05-01 – 2017-05-02 (×2): 10 mg via ORAL
  Filled 2017-05-01 (×2): qty 1

## 2017-05-01 MED ORDER — HYDRALAZINE HCL 20 MG/ML IJ SOLN: 10.0000 mg | Freq: Four times a day (QID) | INTRAMUSCULAR | Status: DC | PRN

## 2017-05-01 MED ORDER — ONDANSETRON HCL 4 MG/2ML IJ SOLN: 4.0000 mg | Freq: Four times a day (QID) | INTRAMUSCULAR | Status: DC | PRN

## 2017-05-01 MED ORDER — ONDANSETRON HCL 4 MG PO TABS: 4.0000 mg | ORAL_TABLET | Freq: Four times a day (QID) | ORAL | Status: DC | PRN

## 2017-05-01 MED ORDER — ALBUTEROL SULFATE (2.5 MG/3ML) 0.083% IN NEBU: 2.5000 mg | INHALATION_SOLUTION | RESPIRATORY_TRACT | Status: DC | PRN

## 2017-05-01 MED ORDER — CEPHALEXIN 250 MG PO CAPS
250.0000 mg | ORAL_CAPSULE | Freq: Two times a day (BID) | ORAL | Status: DC
  Administered 2017-05-01: 250 mg via ORAL
  Filled 2017-05-01 (×2): qty 1

## 2017-05-01 MED ORDER — HYDROCODONE-ACETAMINOPHEN 5-325 MG PO TABS: 1.0000 | ORAL_TABLET | ORAL | Status: DC | PRN

## 2017-05-01 MED ORDER — ENOXAPARIN SODIUM 40 MG/0.4ML ~~LOC~~ SOLN: 40.0000 mg | SUBCUTANEOUS | Status: DC

## 2017-05-01 MED ORDER — ACETAMINOPHEN 650 MG RE SUPP: 650.0000 mg | Freq: Four times a day (QID) | RECTAL | Status: DC | PRN

## 2017-05-01 MED ORDER — VANCOMYCIN 50 MG/ML ORAL SOLUTION
125.0000 mg | Freq: Four times a day (QID) | ORAL | Status: DC
  Administered 2017-05-01 – 2017-05-02 (×6): 125 mg via ORAL
  Filled 2017-05-01 (×7): qty 2.5

## 2017-05-01 MED ORDER — CEPHALEXIN 250 MG/5ML PO SUSR
250.0000 mg | Freq: Two times a day (BID) | ORAL | Status: DC
  Administered 2017-05-01 – 2017-05-02 (×2): 250 mg via ORAL
  Filled 2017-05-01 (×3): qty 5

## 2017-05-01 MED ORDER — ACETAMINOPHEN 325 MG PO TABS: 650.0000 mg | ORAL_TABLET | Freq: Four times a day (QID) | ORAL | Status: DC | PRN

## 2017-05-01 MED ORDER — OXYBUTYNIN CHLORIDE 5 MG PO TABS
5.0000 mg | ORAL_TABLET | Freq: Two times a day (BID) | ORAL | Status: DC
  Administered 2017-05-01 – 2017-05-02 (×3): 5 mg via ORAL
  Filled 2017-05-01 (×3): qty 1

## 2017-05-01 MED ORDER — ALLOPURINOL 100 MG PO TABS
100.0000 mg | ORAL_TABLET | Freq: Every day | ORAL | Status: DC
  Administered 2017-05-01 – 2017-05-02 (×2): 100 mg via ORAL
  Filled 2017-05-01 (×2): qty 1

## 2017-05-01 MED ORDER — TRAMADOL HCL 50 MG PO TABS: 50.0000 mg | ORAL_TABLET | Freq: Three times a day (TID) | ORAL | Status: DC | PRN

## 2017-05-01 NOTE — ED Notes (Addendum)
Patient transported to room 252

## 2017-05-01 NOTE — H&P (Signed)
[facility] [place] NAME: Kimberly Abbott    MR#:  [id number]  DATE OF BIRTH:  1921-04-21  DATE OF ADMISSION:  04/30/2017  PRIMARY CARE PHYSICIAN: Adline Potter, MD   REQUESTING/REFERRING PHYSICIAN: Harvest Dark, MD  CHIEF COMPLAINT:   Chief Complaint  Patient presents with  . Diarrhea   Abdominal pain and diarrhea for 2 days. HISTORY OF PRESENT ILLNESS:  Kimberly Abbott  is a 83 y.o. female with a known history of Partial small bowel obstruction, hypertension, hyperlipidemia, GERD and CKD. The patient presents in the ED with the above chief complaints. She also complains for nausea and vomiting. She denies any fever or chills. She was hospitalized for partial small bowel obstruction last months. Stool test shows positive C. Difficile.  PAST MEDICAL HISTORY:   Past Medical History:  Diagnosis Date  . Allergy   . CKD (chronic kidney disease)   . GERD (gastroesophageal reflux disease)   . Hyperlipidemia   . Hypertension     PAST SURGICAL HISTORY:   Past Surgical History:  Procedure Laterality Date  . APPENDECTOMY    . ESOPHAGOGASTRODUODENOSCOPY (EGD) WITH PROPOFOL N/A 03/12/2017   Procedure: ESOPHAGOGASTRODUODENOSCOPY (EGD) WITH PROPOFOL;  Surgeon: Lucilla Lame, MD;  Location: [facility] ENDOSCOPY;  Service: Endoscopy;  Laterality: N/A;  . rectal fistula  in the 40's  . TUBAL LIGATION      SOCIAL HISTORY:   Social History  Substance Use Topics  . Smoking status: Former Research scientist (life sciences)  . Smokeless tobacco: Never Used  . Alcohol use No    FAMILY HISTORY:   Family History  Problem Relation Age of Onset  . Diabetes Mother     DRUG ALLERGIES:   Allergies  Allergen Reactions  . Penicillin G Hives  . Penicillins Hives    Has patient had a PCN reaction causing immediate rash, facial/tongue/throat swelling, SOB or lightheadedness with hypotension: No Has patient had a PCN reaction causing severe rash involving mucus membranes  or skin necrosis: No Has patient had a PCN reaction that required hospitalization No Has patient had a PCN reaction occurring within the last 10 years: No If all of the above answers are "NO", then may proceed with Cephalosporin use.    REVIEW OF SYSTEMS:   Review of Systems  Constitutional: Positive for malaise/fatigue. Negative for chills and fever.  HENT: Negative for sore throat.   Eyes: Negative for blurred vision and double vision.  Respiratory: Negative for cough, hemoptysis, shortness of breath, wheezing and stridor.   Cardiovascular: Negative for chest pain, palpitations, orthopnea and leg swelling.  Gastrointestinal: Positive for abdominal pain, diarrhea and nausea. Negative for blood in stool, melena and vomiting.  Genitourinary: Negative for dysuria, flank pain and hematuria.  Musculoskeletal: Negative for back pain and joint pain.  Skin: Negative for rash.  Neurological: Negative for dizziness, sensory change, focal weakness, seizures, loss of consciousness, weakness and headaches.  Endo/Heme/Allergies: Negative for polydipsia.  Psychiatric/Behavioral: Negative for depression. The patient is not nervous/anxious.     MEDICATIONS AT HOME:   Prior to Admission medications   Medication Sig Start Date End Date Taking? Authorizing Provider  cephALEXin (KEFLEX) 250 MG/5ML suspension Take 10 mLs (500 mg total) by mouth 2 (two) times daily. 04/30/17  Yes Plonk, Gwyndolyn Saxon, MD  mirtazapine (REMERON) 15 MG tablet Take 15 mg by mouth at bedtime.   Yes [provider]  oxybutynin (DITROPAN) 5 MG tablet Take 5 mg by mouth 2 (two) times daily.   Yes  [provider]  allopurinol (ZYLOPRIM) 100 MG tablet Take 100 mg by mouth daily.    [provider]  feeding supplement, ENSURE ENLIVE, (ENSURE ENLIVE) LIQD Take 237 mLs by mouth 2 (two) times daily between meals. 06/10/16   Fritzi Mandes, MD  traMADol (ULTRAM) 50 MG tablet Take 1 tablet (50 mg total) by mouth every 8  (eight) hours as needed. 04/24/17   Plonk, Gwyndolyn Saxon, MD      VITAL SIGNS:  Blood pressure (!) 187/104, pulse 87, temperature 98.2 F (36.8 C), temperature source Oral, resp. rate 20, height 5\' 4"  (1.626 m), weight 105 lb (47.6 kg), SpO2 99 %.  PHYSICAL EXAMINATION:  Physical Exam  GENERAL:  83 y.o.-year-old patient lying in the bed with no acute distress.  EYES: Pupils equal, round, reactive to light and accommodation. No scleral icterus. Extraocular muscles intact.  HEENT: Head atraumatic, normocephalic. Oropharynx and nasopharynx clear.  NECK:  Supple, no jugular venous distention. No thyroid enlargement, no tenderness.  LUNGS: Normal breath sounds bilaterally, no wheezing, rales,rhonchi or crepitation. No use of accessory muscles of respiration.  CARDIOVASCULAR: S1, S2 normal. No murmurs, rubs, or gallops.  ABDOMEN: Soft, tenderness in the middle part of the abdomen, nondistended. Bowel sounds present. No organomegaly or mass.  EXTREMITIES: No pedal edema, cyanosis, or clubbing.  NEUROLOGIC: Cranial nerves II through XII are intact. Muscle strength 5/5 in all extremities. Sensation intact. Gait not checked.  PSYCHIATRIC: The patient is alert and oriented x 3.  SKIN: No obvious rash, lesion, or ulcer.   LABORATORY PANEL:   CBC  Recent Labs Lab 04/30/17 1728  WBC 9.9  HGB 12.9  HCT 38.2  PLT 282   ------------------------------------------------------------------------------------------------------------------  Chemistries   Recent Labs Lab 04/30/17 1728  NA 136  K 4.0  CL 99*  CO2 27  GLUCOSE 121*  BUN 22*  CREATININE 1.16*  CALCIUM 10.2  AST 22  ALT 13*  ALKPHOS 117  BILITOT 0.5   ------------------------------------------------------------------------------------------------------------------  Cardiac Enzymes  Recent Labs Lab 04/30/17 1728  TROPONINI 0.03*    ------------------------------------------------------------------------------------------------------------------  RADIOLOGY:  Ct Abdomen Pelvis W Contrast  Result Date: 04/30/2017 CLINICAL DATA:  Diarrhea 2 days. EXAM: CT ABDOMEN AND PELVIS WITH CONTRAST TECHNIQUE: Multidetector CT imaging of the abdomen and pelvis was performed using the standard protocol following bolus administration of intravenous contrast. CONTRAST:  54mL ISOVUE-300 IOPAMIDOL (ISOVUE-300) INJECTION 61% COMPARISON:  03/06/2017 and 07/16/2014 FINDINGS: Lower chest: Lung bases demonstrate subtle patchy opacification as well as bronchiectatic change. Findings may be due to atelectasis or infection. Calcified granuloma right lower lobe. No effusion. Calcified plaque over the left anterior descending and right coronary arteries. Hepatobiliary: Gallbladder is within normal. There is minimal prominence of the central intrahepatic ducts without significant change. Subcentimeter hypodensity over the anterior right lobe of the liver too small to characterize but likely a cyst. Pancreas: Pancreas is within normal. There is mild prominence of the common bile duct at the head of the pancreas measuring 9 mm unchanged. No ductal stones or mass. Spleen: Multiple splenic granulomas. Adrenals/Urinary Tract: Minimal stable prominence of the left adrenal gland. Right adrenal gland is normal. Kidneys are normal in size and demonstrate a 2 cm cyst over the mid pole left kidney. There are several other subcentimeter bilateral renal cortical hypodensities too small to characterize but likely cysts. Minimal prominence of the right intrarenal collecting system. No renal stones. Ureters are within normal. Bladder is normal. Stomach/Bowel: Stomach is within normal. There are multiple minimally dilated air and contrast  filled small bowel loops without significant change compared to the prior exam. Normal caliber ileal loops. No definite transition point  identified. Previous appendectomy. There is moderate diverticulosis of the colon most prominent over the sigmoid colon. Vascular/Lymphatic: Moderate calcified plaque involving the abdominal aorta and iliac arteries. No adenopathy. Reproductive: Within normal. Other: Very small amount of free fluid in the pelvis. No free peritoneal air. No abdominal wall hernia. Musculoskeletal: Moderate degenerative changes spinal multilevel disc disease of the visualized thoracolumbar spine. Mild degenerative change of the hips. IMPRESSION: Multiple mildly dilated air and contrast filled small bowel loops without change from the prior exam. Findings may be due to a chronic partial small bowel obstruction versus ileus. Mild patchy opacification in the lung bases with bronchiectasis. Findings may be due to atelectasis or early infection. 2 cm left renal cyst as well as multiple small bilateral subcentimeter renal cortical hypodensities too small to characterize but likely cysts. Mild stable chronic prominence of the central intrahepatic ducts and common bile duct. Moderate diverticulosis of the colon most involving the sigmoid colon. Electronically Signed   By: Marin Olp M.D.   On: 04/30/2017 22:06      IMPRESSION AND PLAN:   C. difficile colitis. The patient will be admitted to medical floor. Start vancomycin by mouth 4 times a day. Contact isolation. IV fluid to support.  Partial small bowel obstruction. Nothing by mouth except medication and water. IV fluid support.  Accelerated hypertension. Start Norvasc and IV hydralazine when necessary.  CKD stage III. Stable.  All the records are reviewed and case discussed with ED provider. Management plans discussed with the patient, her daughter and they are in agreement.  CODE STATUS: full code  TOTAL TIME TAKING CARE OF THIS PATIENT: 53 minutes.    Demetrios Loll M.D on 05/01/2017 at 1:21 AM  Between 7am to 6pm - Pager - [phone]  After 6pm go to  [email or web address] - Proofreader  [facility]  Office  [phone]  CC: Primary care physician; Adline Potter, MD   Note: This dictation was prepared with Dragon dictation along with smaller phrase technology. Any transcriptional errors that result from this process are unin

## 2017-05-01 NOTE — Progress Notes (Signed)
[facility] [place] NAME: Kimberly Abbott    MR#:  [id number]  DATE OF BIRTH:  Apr 09, 1921  SUBJECTIVE:  Patient states she feels a whole of better. I recently note. She is feeling hungry. No fever  REVIEW OF SYSTEMS:   Review of Systems  Constitutional: Negative for chills, fever and weight loss.  HENT: Negative for ear discharge, ear pain and nosebleeds.   Eyes: Negative for blurred vision, pain and discharge.  Respiratory: Negative for sputum production, shortness of breath, wheezing and stridor.   Cardiovascular: Negative for chest pain, palpitations, orthopnea and PND.  Gastrointestinal: Positive for diarrhea. Negative for abdominal pain, nausea and vomiting.  Genitourinary: Negative for frequency and urgency.  Musculoskeletal: Negative for back pain and joint pain.  Neurological: Positive for weakness. Negative for sensory change, speech change and focal weakness.  Psychiatric/Behavioral: Negative for depression and hallucinations. The patient is not nervous/anxious.    Tolerating Diet:yes Tolerating PT: Pending  DRUG ALLERGIES:   Allergies  Allergen Reactions  . Penicillin G Hives  . Penicillins Hives    Has patient had a PCN reaction causing immediate rash, facial/tongue/throat swelling, SOB or lightheadedness with hypotension: No Has patient had a PCN reaction causing severe rash involving mucus membranes or skin necrosis: No Has patient had a PCN reaction that required hospitalization No Has patient had a PCN reaction occurring within the last 10 years: No If all of the above answers are "NO", then may proceed with Cephalosporin use.    VITALS:  Blood pressure (!) 150/63, pulse 73, temperature 98 F (36.7 C), temperature source Oral, resp. rate 16, height 4\' 11"  (1.499 m), weight 49.3 kg (108 lb 11.2 oz), SpO2 99 %.  PHYSICAL EXAMINATION:   Physical Exam  GENERAL:  83 y.o.-year-old patient lying in the bed with  no acute distress.  EYES: Pupils equal, round, reactive to light and accommodation. No scleral icterus. Extraocular muscles intact.  HEENT: Head atraumatic, normocephalic. Oropharynx and nasopharynx clear.  NECK:  Supple, no jugular venous distention. No thyroid enlargement, no tenderness.  LUNGS: Normal breath sounds bilaterally, no wheezing, rales, rhonchi. No use of accessory muscles of respiration.  CARDIOVASCULAR: S1, S2 normal. No murmurs, rubs, or gallops.  ABDOMEN: Soft, nontender, nondistended. Bowel sounds present. No organomegaly or mass.  EXTREMITIES: No cyanosis, clubbing or edema b/l.    NEUROLOGIC: Cranial nerves II through XII are intact. No focal Motor or sensory deficits b/l.   PSYCHIATRIC:  patient is alert and oriented x 3.  SKIN: No obvious rash, lesion, or ulcer.   LABORATORY PANEL:  CBC  Recent Labs Lab 05/01/17 0548  WBC 7.3  HGB 10.7*  HCT 31.3*  PLT 208    Chemistries   Recent Labs Lab 04/30/17 1728 05/01/17 0548  NA 136 138  K 4.0 3.4*  CL 99* 109  CO2 27 25  GLUCOSE 121* 98  BUN 22* 17  CREATININE 1.16* 1.09*  CALCIUM 10.2 8.8*  AST 22  --   ALT 13*  --   ALKPHOS 117  --   BILITOT 0.5  --    Cardiac Enzymes  Recent Labs Lab 04/30/17 1728  TROPONINI 0.03*   RADIOLOGY:  Ct Abdomen Pelvis W Contrast  Result Date: 04/30/2017 CLINICAL DATA:  Diarrhea 2 days. EXAM: CT ABDOMEN AND PELVIS WITH CONTRAST TECHNIQUE: Multidetector CT imaging of the abdomen and pelvis was performed using the standard protocol following bolus administration of intravenous contrast. CONTRAST:  20mL ISOVUE-300 IOPAMIDOL (  ISOVUE-300) INJECTION 61% COMPARISON:  03/06/2017 and 07/16/2014 FINDINGS: Lower chest: Lung bases demonstrate subtle patchy opacification as well as bronchiectatic change. Findings may be due to atelectasis or infection. Calcified granuloma right lower lobe. No effusion. Calcified plaque over the left anterior descending and right coronary arteries.  Hepatobiliary: Gallbladder is within normal. There is minimal prominence of the central intrahepatic ducts without significant change. Subcentimeter hypodensity over the anterior right lobe of the liver too small to characterize but likely a cyst. Pancreas: Pancreas is within normal. There is mild prominence of the common bile duct at the head of the pancreas measuring 9 mm unchanged. No ductal stones or mass. Spleen: Multiple splenic granulomas. Adrenals/Urinary Tract: Minimal stable prominence of the left adrenal gland. Right adrenal gland is normal. Kidneys are normal in size and demonstrate a 2 cm cyst over the mid pole left kidney. There are several other subcentimeter bilateral renal cortical hypodensities too small to characterize but likely cysts. Minimal prominence of the right intrarenal collecting system. No renal stones. Ureters are within normal. Bladder is normal. Stomach/Bowel: Stomach is within normal. There are multiple minimally dilated air and contrast filled small bowel loops without significant change compared to the prior exam. Normal caliber ileal loops. No definite transition point identified. Previous appendectomy. There is moderate diverticulosis of the colon most prominent over the sigmoid colon. Vascular/Lymphatic: Moderate calcified plaque involving the abdominal aorta and iliac arteries. No adenopathy. Reproductive: Within normal. Other: Very small amount of free fluid in the pelvis. No free peritoneal air. No abdominal wall hernia. Musculoskeletal: Moderate degenerative changes spinal multilevel disc disease of the visualized thoracolumbar spine. Mild degenerative change of the hips. IMPRESSION: Multiple mildly dilated air and contrast filled small bowel loops without change from the prior exam. Findings may be due to a chronic partial small bowel obstruction versus ileus. Mild patchy opacification in the lung bases with bronchiectasis. Findings may be due to atelectasis or early  infection. 2 cm left renal cyst as well as multiple small bilateral subcentimeter renal cortical hypodensities too small to characterize but likely cysts. Mild stable chronic prominence of the central intrahepatic ducts and common bile duct. Moderate diverticulosis of the colon most involving the sigmoid colon. Electronically Signed   By: Marin Olp M.D.   On: 04/30/2017 22:06   ASSESSMENT AND PLAN:   Kimberly Abbott  is a 83 y.o. female with a known history of Partial small bowel obstruction, hypertension, hyperlipidemia, GERD and CKD. The patient presents in the ED with the above chief complaints. She also complains for nausea and vomiting. She denies any fever or chills. She was hospitalized for partial small bowel obstruction last months. Stool test shows positive C. Difficile.  *C. difficile colitis -Continue oral vancomycin -Patient recently was started on some antibiotic however did not take the course completely -Liquid diet -IV fluids for hydration  *CKD stage III -Stable  *Hypertension continue home meds  *Ileus/partial small bowel obstruction secondary to C. difficile colitis  *Recurrent UTI -Patient of Dr. complete her course of antibiotic -We'll give oral Keflex for 5 days   Case discussed with Care Management/Social Worker. Management plans discussed with the patient, family and they are in agreement.  CODE STATUS: FULL  DVT Prophylaxis: lovenox  TOTAL TIME TAKING CARE OF THIS PATIENT: 30* minutes.  >50% time spent on counselling and coordination of care  POSSIBLE D/C IN 1-2 DAYS, DEPENDING ON CLINICAL CONDITION.  Note: This dictation was prepared with Dragon dictation along with smaller phrase technology. Any  transcriptional errors that result from this process are unintentional.  Vedansh Kerstetter M.D on 05/01/2017 at 3:03 PM  Between 7am to 6pm - Pager - [phone]  After 6pm go to [email or web address] - password [email or web address]  Office   [phone]  CC: Primary care physician; Adline Potter, MDPatient ID: Kimberly Abbott, female   DOB: 14-Sep-1920, 83 y.o.   MRN: [id number]

## 2017-05-02 MED ORDER — CEPHALEXIN 250 MG/5ML PO SUSR: 250.0000 mg | Freq: Two times a day (BID) | ORAL | 0 refills | Status: DC

## 2017-05-02 MED ORDER — VANCOMYCIN 50 MG/ML ORAL SOLUTION: 125.0000 mg | Freq: Four times a day (QID) | ORAL | 0 refills | Status: AC

## 2017-05-02 MED ORDER — VANCOMYCIN 50 MG/ML ORAL SOLUTION: 125.0000 mg | Freq: Four times a day (QID) | ORAL | 0 refills | Status: DC

## 2017-05-02 NOTE — Care Management (Addendum)
Notified [facility] of discharge today.  CM obtained order from attending to call in script for Vancomycin 125mg  1 po qid x 8 days. No refills.  Medication is in stock.  Copay will be 10 dollarS.  [place]

## 2017-05-02 NOTE — Discharge Instructions (Signed)
CM CALLED VANCOMYCIN CAPSULES TO [facility]  PER FAMILY REQUEST.  COPAY 10 DOLLARS

## 2017-05-02 NOTE — Discharge Summary (Signed)
[facility] [place] NAME: Kimberly Abbott    MR#:  [id number]  DATE OF BIRTH:  [birth year]  DATE OF ADMISSION:  04/30/2017 ADMITTING PHYSICIAN: Demetrios Loll, MD  DATE OF DISCHARGE: 05/02/2017 PRIMARY CARE PHYSICIAN: Adline Potter, MD    ADMISSION DIAGNOSIS:  Diarrhea of infectious origin [A09] C. difficile diarrhea [A04.72]  DISCHARGE DIAGNOSIS:  Clostridium difficile diarrhea UTI  SECONDARY DIAGNOSIS:   Past Medical History:  Diagnosis Date  . Allergy   . CKD (chronic kidney disease)   . GERD (gastroesophageal reflux disease)   . Hyperlipidemia   . Hypertension     HOSPITAL COURSE:   BonnieJohnsonis a 83 y.o.femalewith a known history of Partial small bowel obstruction, hypertension, hyperlipidemia, GERD and CKD. The patient presents in the ED with the above chief complaints. She also complains for nausea and vomiting. She denies any fever or chills. She was hospitalized for partial small bowel obstruction last months. Stool test shows positive C. Difficile.  *C. difficile colitis -Continue oral vancomycin -Patient recently was started on some antibiotic however did not take the course completely -Liquid diet---now on regular diet -received IV fluids for hydration  *CKD stage III -Stable  *Hypertension continue home meds  *Ileus/partial small bowel obstruction secondary to C. difficile colitis  *Recurrent UTI -Patient of Dr. complete her course of antibiotic -We'll give oral Keflex for 5 days   Overall stable D/c home with resumption of HH services Left message for dter Kimberly Abbott  CONSULTS OBTAINED:    DRUG ALLERGIES:   Allergies  Allergen Reactions  . Penicillin G Hives  . Penicillins Hives    Has patient had a PCN reaction causing immediate rash, facial/tongue/throat swelling, SOB or lightheadedness with hypotension: No Has patient had a PCN reaction causing severe rash involving mucus  membranes or skin necrosis: No Has patient had a PCN reaction that required hospitalization No Has patient had a PCN reaction occurring within the last 10 years: No If all of the above answers are "NO", then may proceed with Cephalosporin use.    DISCHARGE MEDICATIONS:   Current Discharge Medication List    START taking these medications   Details  vancomycin (VANCOCIN) 50 mg/mL oral solution Take 2.5 mLs (125 mg total) by mouth every 6 (six) hours. Qty: 80 mL, Refills: 0      CONTINUE these medications which have CHANGED   Details  cephALEXin (KEFLEX) 250 MG/5ML suspension Take 5 mLs (250 mg total) by mouth every 12 (twelve) hours. Qty: 100 mL, Refills: 0      CONTINUE these medications which have NOT CHANGED   Details  mirtazapine (REMERON) 15 MG tablet Take 15 mg by mouth at bedtime.    oxybutynin (DITROPAN) 5 MG tablet Take 5 mg by mouth 2 (two) times daily.    allopurinol (ZYLOPRIM) 100 MG tablet Take 100 mg by mouth daily.    feeding supplement, ENSURE ENLIVE, (ENSURE ENLIVE) LIQD Take 237 mLs by mouth 2 (two) times daily between meals. Qty: 237 mL, Refills: 12    traMADol (ULTRAM) 50 MG tablet Take 1 tablet (50 mg total) by mouth every 8 (eight) hours as needed. Qty: 10 tablet, Refills: 0        If you experience worsening of your admission symptoms, develop shortness of breath, life threatening emergency, suicidal or homicidal thoughts you must seek medical attention immediately by calling 911 or calling your MD immediately  if symptoms less severe.  You Must read complete  instructions/literature along with all the possible adverse reactions/side effects for all the Medicines you take and that have been prescribed to you. Take any new Medicines after you have completely understood and accept all the possible adverse reactions/side effects.   Please note  You were cared for by a hospitalist during your hospital stay. If you have any questions about your discharge  medications or the care you received while you were in the hospital after you are discharged, you can call the unit and asked to speak with the hospitalist on call if the hospitalist that took care of you is not available. Once you are discharged, your primary care physician will handle any further medical issues. Please note that NO REFILLS for any discharge medications will be authorized once you are discharged, as it is imperative that you return to your primary care physician (or establish a relationship with a primary care physician if you do not have one) for your aftercare needs so that they can reassess your need for medications and monitor your lab values. Today   SUBJECTIVE   Doing well  VITAL SIGNS:  Blood pressure 133/62, pulse 75, temperature 98.1 F (36.7 C), temperature source Oral, resp. rate 18, height 4\' 11"  (1.499 m), weight 49.3 kg (108 lb 11.2 oz), SpO2 99 %.  I/O:   Intake/Output Summary (Last 24 hours) at 05/02/17 1126 Last data filed at 05/02/17 1000  Gross per 24 hour  Intake              240 ml  Output              700 ml  Net             -460 ml    PHYSICAL EXAMINATION:  GENERAL:  83 y.o.-year-old patient lying in the bed with no acute distress.  EYES: Pupils equal, round, reactive to light and accommodation. No scleral icterus. Extraocular muscles intact.  HEENT: Head atraumatic, normocephalic. Oropharynx and nasopharynx clear.  NECK:  Supple, no jugular venous distention. No thyroid enlargement, no tenderness.  LUNGS: Normal breath sounds bilaterally, no wheezing, rales,rhonchi or crepitation. No use of accessory muscles of respiration.  CARDIOVASCULAR: S1, S2 normal. No murmurs, rubs, or gallops.  ABDOMEN: Soft, non-tender, non-distended. Bowel sounds present. No organomegaly or mass.  EXTREMITIES: No pedal edema, cyanosis, or clubbing.  NEUROLOGIC: Cranial nerves II through XII are intact. Muscle strength 5/5 in all extremities. Sensation intact. Gait not  checked.  PSYCHIATRIC: The patient is alert and oriented x 3.  SKIN: No obvious rash, lesion, or ulcer.   DATA REVIEW:   CBC   Recent Labs Lab 05/01/17 0548  WBC 7.3  HGB 10.7*  HCT 31.3*  PLT 208    Chemistries   Recent Labs Lab 04/30/17 1728 05/01/17 0548  NA 136 138  K 4.0 3.4*  CL 99* 109  CO2 27 25  GLUCOSE 121* 98  BUN 22* 17  CREATININE 1.16* 1.09*  CALCIUM 10.2 8.8*  AST 22  --   ALT 13*  --   ALKPHOS 117  --   BILITOT 0.5  --     Microbiology Results   Recent Results (from the past 240 hour(s))  Urine Culture     Status: Abnormal   Collection Time: 04/24/17 12:00 AM  Result Value Ref Range Status   Urine Culture, Routine Final report (A)  Final   Organism ID, Bacteria Comment (A)  Final    Comment: Escherichia coli, identified by an automated  biochemical system. Greater than 100,000 colony forming units per mL Cefazolin <=4 ug/mL Cefazolin with an MIC <=16 predicts susceptibility to the oral agents cefaclor, cefdinir, cefpodoxime, cefprozil, cefuroxime, cephalexin, and loracarbef when used for therapy of uncomplicated urinary tract infections due to E. coli, Klebsiella pneumoniae, and Proteus mirabilis.    Antimicrobial Susceptibility Comment  Final    Comment:       ** S = Susceptible; I = Intermediate; R = Resistant **                    P = Positive; N = Negative             MICS are expressed in micrograms per mL    Antibiotic                 RSLT#1    RSLT#2    RSLT#3    RSLT#4 Amoxicillin/Clavulanic Acid    S Ampicillin                     S Cefepime                       S Ceftriaxone                    S Cefuroxime                     S Ciprofloxacin                  S Ertapenem                      S Gentamicin                     S Imipenem                       S Levofloxacin                   S Meropenem                      S Nitrofurantoin                 S Piperacillin/Tazobactam        S Tetracycline                    S Tobramycin                     S Trimethoprim/Sulfa             R   C difficile quick scan w PCR reflex     Status: Abnormal   Collection Time: 04/30/17  8:40 PM  Result Value Ref Range Status   C Diff antigen POSITIVE (A) NEGATIVE Final   C Diff toxin NEGATIVE NEGATIVE Final   C Diff interpretation Results are indeterminate. See PCR results.  Final  Gastrointestinal Panel by PCR , Stool     Status: None   Collection Time: 04/30/17  8:40 PM  Result Value Ref Range Status   Campylobacter species NOT DETECTED NOT DETECTED Final   Plesimonas shigelloides NOT DETECTED NOT DETECTED Final   Salmonella species NOT DETECTED NOT DETECTED Final   Yersinia enterocolitica NOT DETECTED NOT DETECTED Final   Vibrio species NOT DETECTED NOT DETECTED Final   Vibrio  cholerae NOT DETECTED NOT DETECTED Final   Enteroaggregative E coli (EAEC) NOT DETECTED NOT DETECTED Final   Enteropathogenic E coli (EPEC) NOT DETECTED NOT DETECTED Final   Enterotoxigenic E coli (ETEC) NOT DETECTED NOT DETECTED Final   Shiga like toxin producing E coli (STEC) NOT DETECTED NOT DETECTED Final   Shigella/Enteroinvasive E coli (EIEC) NOT DETECTED NOT DETECTED Final   Cryptosporidium NOT DETECTED NOT DETECTED Final   Cyclospora cayetanensis NOT DETECTED NOT DETECTED Final   Entamoeba histolytica NOT DETECTED NOT DETECTED Final   Giardia lamblia NOT DETECTED NOT DETECTED Final   Adenovirus F40/41 NOT DETECTED NOT DETECTED Final   Astrovirus NOT DETECTED NOT DETECTED Final   Norovirus GI/GII NOT DETECTED NOT DETECTED Final   Rotavirus A NOT DETECTED NOT DETECTED Final   Sapovirus (I, II, IV, and V) NOT DETECTED NOT DETECTED Final  Clostridium Difficile by PCR     Status: Abnormal   Collection Time: 04/30/17  8:40 PM  Result Value Ref Range Status   Toxigenic C Difficile by pcr POSITIVE (A) NEGATIVE Final    Comment: Positive for toxigenic C. difficile with little to no toxin production. Only treat if clinical presentation  suggests symptomatic illness.    RADIOLOGY:  Ct Abdomen Pelvis W Contrast  Result Date: 04/30/2017 CLINICAL DATA:  Diarrhea 2 days. EXAM: CT ABDOMEN AND PELVIS WITH CONTRAST TECHNIQUE: Multidetector CT imaging of the abdomen and pelvis was performed using the standard protocol following bolus administration of intravenous contrast. CONTRAST:  58mL ISOVUE-300 IOPAMIDOL (ISOVUE-300) INJECTION 61% COMPARISON:  03/06/2017 and 07/16/2014 FINDINGS: Lower chest: Lung bases demonstrate subtle patchy opacification as well as bronchiectatic change. Findings may be due to atelectasis or infection. Calcified granuloma right lower lobe. No effusion. Calcified plaque over the left anterior descending and right coronary arteries. Hepatobiliary: Gallbladder is within normal. There is minimal prominence of the central intrahepatic ducts without significant change. Subcentimeter hypodensity over the anterior right lobe of the liver too small to characterize but likely a cyst. Pancreas: Pancreas is within normal. There is mild prominence of the common bile duct at the head of the pancreas measuring 9 mm unchanged. No ductal stones or mass. Spleen: Multiple splenic granulomas. Adrenals/Urinary Tract: Minimal stable prominence of the left adrenal gland. Right adrenal gland is normal. Kidneys are normal in size and demonstrate a 2 cm cyst over the mid pole left kidney. There are several other subcentimeter bilateral renal cortical hypodensities too small to characterize but likely cysts. Minimal prominence of the right intrarenal collecting system. No renal stones. Ureters are within normal. Bladder is normal. Stomach/Bowel: Stomach is within normal. There are multiple minimally dilated air and contrast filled small bowel loops without significant change compared to the prior exam. Normal caliber ileal loops. No definite transition point identified. Previous appendectomy. There is moderate diverticulosis of the colon most prominent  over the sigmoid colon. Vascular/Lymphatic: Moderate calcified plaque involving the abdominal aorta and iliac arteries. No adenopathy. Reproductive: Within normal. Other: Very small amount of free fluid in the pelvis. No free peritoneal air. No abdominal wall hernia. Musculoskeletal: Moderate degenerative changes spinal multilevel disc disease of the visualized thoracolumbar spine. Mild degenerative change of the hips. IMPRESSION: Multiple mildly dilated air and contrast filled small bowel loops without change from the prior exam. Findings may be due to a chronic partial small bowel obstruction versus ileus. Mild patchy opacification in the lung bases with bronchiectasis. Findings may be due to atelectasis or early infection. 2 cm left renal cyst as well  as multiple small bilateral subcentimeter renal cortical hypodensities too small to characterize but likely cysts. Mild stable chronic prominence of the central intrahepatic ducts and common bile duct. Moderate diverticulosis of the colon most involving the sigmoid colon. Electronically Signed   By: Marin Olp M.D.   On: 04/30/2017 22:06     Management plans discussed with the patient, family and they are in agreement.  CODE STATUS:     Code Status Orders        Start     Ordered   05/01/17 0211  Full code  Continuous     05/01/17 0210    Code Status History    Date Active Date Inactive Code Status Order ID Comments User Context   03/06/2017  8:33 PM 03/20/2017  5:53 PM Full Code [id number]  Demetrios Loll, MD Inpatient   06/08/2016 11:37 AM 06/10/2016  3:53 PM Full Code [id number]  Fritzi Mandes, MD Inpatient      TOTAL TIME TAKING CARE OF THIS PATIENT: 40 minutes.    Jadarrius Maselli M.D on 05/02/2017 at 11:26 AM  Between 7am to 6pm - Pager - [phone] After 6pm go to [email or web address] - password [email or web address] [facility]  Office  [phone]  CC: Primary care physician; Adline Potter, MD

## 2017-05-02 NOTE — Care Management (Signed)
Patient is currently open to [facility] SN and PT.  Notified agency of admission

## 2017-05-02 NOTE — Progress Notes (Signed)
Patient is discharge home in a stable condition, summary and f/u care given to both pt and son in law , verbalized understanding .

## 2017-05-02 NOTE — Evaluation (Signed)
Physical Therapy Evaluation Patient Details Name: Kimberly Abbott MRN: [id number] DOB: 1921/01/26 Today's Date: 05/02/2017   History of Present Illness  presented to ER secondary to worsening nausea/vomiting; admitted with C-diff colitis.  Clinical Impression  Upon evaluation, patient alert and oriented; follows all commands and demonstrates good insight/safety awareness.  Bilat UE/LE strength and ROM grossly symmetrical and WFL for basic transfers and mobility.  Demonstrates ability to complete bed mobility with mod indep; sit/stand, basic transfers and gait (220') with RW, cga/close sup.  Decreased gait speed, decreased functional reach evident with mobility tasks; recommend continued use of RW with all mobility at this time. Would benefit from skilled PT to address above deficits and promote optimal return to PLOF; Recommend transition to [place] upon discharge from acute hospitalization.     Follow Up Recommendations Home health PT    Equipment Recommendations   (has RW at home)    Recommendations for Other Services       Precautions / Restrictions Precautions Precautions: Fall Restrictions Weight Bearing Restrictions: No      Mobility  Bed Mobility Overal bed mobility: Modified Independent                Transfers Overall transfer level: Needs assistance Equipment used: Rolling walker (2 wheeled) Transfers: Sit to/from Stand Sit to Stand: Min guard            Ambulation/Gait Ambulation/Gait assistance: Min guard Ambulation Distance (Feet): 220 Feet Assistive device: Rolling walker (2 wheeled)   Gait velocity: 10' walk time, 9-10 seconds Gait velocity interpretation: <1.8 ft/sec, indicative of risk for recurrent falls General Gait Details: forward flexed posture, reciprocal stepping pattern ;decreased cadence and gait speed  Stairs            Wheelchair Mobility    Modified Rankin (Stroke Patients Only)       Balance Overall balance assessment:  Needs assistance Sitting-balance support: No upper extremity supported;Feet supported Sitting balance-Leahy Scale: Good     Standing balance support: Bilateral upper extremity supported Standing balance-Leahy Scale: Fair                               Pertinent Vitals/Pain Pain Assessment: No/denies pain    Home Living Family/patient expects to be discharged to:: Private residence (senior living apartment) Living Arrangements: Alone Available Help at Discharge: Family;Available PRN/intermittently Type of Home: Apartment Home Access: Level entry;Elevator     Home Layout: One level        Prior Function Level of Independence: Independent with assistive device(s)         Comments: Mod I with ambulation with SPC limited community distances, no fall history, Ind with ADLs     Hand Dominance        Extremity/Trunk Assessment   Upper Extremity Assessment Upper Extremity Assessment: Overall WFL for tasks assessed    Lower Extremity Assessment Lower Extremity Assessment: Overall WFL for tasks assessed (grossly at least 4/5 throughout)       Communication   Communication: No difficulties  Cognition Arousal/Alertness: Awake/alert Behavior During Therapy: WFL for tasks assessed/performed Overall Cognitive Status: Within Functional Limits for tasks assessed                                        General Comments      Exercises Other Exercises Other Exercises: Toilet transfer, ambulatory  with RW, cga; sit/stand from standard toilet with RW, cga.  Standing balance for hygiene, clothing management with RW, close sup.   Other Exercises: Hand hygiene at sink, close sup; functional reach outside immediate BOS approx 3-4" (indicative of increased fall risk).  Min cuing for walker management with approach to surfaces, as patient tends to step away from during tasks.   Assessment/Plan    PT Assessment Patient needs continued PT services  PT  Problem List Decreased strength;Decreased range of motion;Decreased knowledge of use of DME;Decreased balance;Decreased activity tolerance;Decreased mobility;Decreased safety awareness;Decreased knowledge of precautions       PT Treatment Interventions DME instruction;Gait training;Functional mobility training;Therapeutic activities;Therapeutic exercise;Balance training;Patient/family education    PT Goals (Current goals can be found in the Care Plan section)  Acute Rehab PT Goals Patient Stated Goal: to return to apartment PT Goal Formulation: With patient Time For Goal Achievement: 05/16/17 Potential to Achieve Goals: Good    Frequency Min 2X/week   Barriers to discharge Decreased caregiver support      Co-evaluation               AM-PAC PT "6 Clicks" Daily Activity  Outcome Measure Difficulty turning over in bed (including adjusting bedclothes, sheets and blankets)?: None Difficulty moving from lying on back to sitting on the side of the bed? : None Difficulty sitting down on and standing up from a chair with arms (e.g., wheelchair, bedside commode, etc,.)?: A Little Help needed moving to and from a bed to chair (including a wheelchair)?: A Little Help needed walking in hospital room?: A Little Help needed climbing 3-5 steps with a railing? : A Little 6 Click Score: 20    End of Session Equipment Utilized During Treatment: Gait belt Activity Tolerance: Patient tolerated treatment well Patient left: in chair;with call bell/phone within reach;with chair alarm set Nurse Communication: Mobility status PT Visit Diagnosis: Difficulty in walking, not elsewhere classified (R26.2);Muscle weakness (generalized) (M62.81)    Time: 0034-9179 PT Time Calculation (min) (ACUTE ONLY): 33 min   Charges:   PT Evaluation $PT Eval Low Complexity: 1 Low PT Treatments $Gait Training: 8-22 mins $Therapeutic Activity: 8-22 mins   PT G Codes:   PT G-Codes **NOT FOR INPATIENT  CLASS** Functional Assessment Tool Used: AM-PAC 6 Clicks Basic Mobility Functional Limitation: Mobility: Walking and moving around Mobility: Walking and Moving Around Current Status (X5056): At least 20 percent but less than 40 percent impaired, limited or restricted Mobility: Walking and Moving Around Goal Status [phone]): At least 1 percent but less than 20 percent impaired, limited or restricted    Desaree Downen H. Owens Shark, PT, DPT, NCS 05/02/17, 2:35 PM [phone]

## 2017-05-05 DIAGNOSIS — N390 Urinary tract infection, site not specified: Secondary | ICD-10-CM | POA: Diagnosis not present

## 2017-05-05 DIAGNOSIS — A0472 Enterocolitis due to Clostridium difficile, not specified as recurrent: Secondary | ICD-10-CM | POA: Diagnosis not present

## 2017-05-05 DIAGNOSIS — E785 Hyperlipidemia, unspecified: Secondary | ICD-10-CM | POA: Diagnosis not present

## 2017-05-05 DIAGNOSIS — I129 Hypertensive chronic kidney disease with stage 1 through stage 4 chronic kidney disease, or unspecified chronic kidney disease: Secondary | ICD-10-CM | POA: Diagnosis not present

## 2017-05-05 DIAGNOSIS — N183 Chronic kidney disease, stage 3 (moderate): Secondary | ICD-10-CM | POA: Diagnosis not present

## 2017-05-05 DIAGNOSIS — Z87891 Personal history of nicotine dependence: Secondary | ICD-10-CM | POA: Diagnosis not present

## 2017-05-06 ENCOUNTER — Telehealth: Payer: Self-pay

## 2017-05-06 DIAGNOSIS — I129 Hypertensive chronic kidney disease with stage 1 through stage 4 chronic kidney disease, or unspecified chronic kidney disease: Secondary | ICD-10-CM | POA: Diagnosis not present

## 2017-05-06 DIAGNOSIS — E785 Hyperlipidemia, unspecified: Secondary | ICD-10-CM | POA: Diagnosis not present

## 2017-05-06 DIAGNOSIS — N390 Urinary tract infection, site not specified: Secondary | ICD-10-CM | POA: Diagnosis not present

## 2017-05-06 DIAGNOSIS — N183 Chronic kidney disease, stage 3 (moderate): Secondary | ICD-10-CM | POA: Diagnosis not present

## 2017-05-06 DIAGNOSIS — A0472 Enterocolitis due to Clostridium difficile, not specified as recurrent: Secondary | ICD-10-CM | POA: Diagnosis not present

## 2017-05-06 DIAGNOSIS — Z87891 Personal history of nicotine dependence: Secondary | ICD-10-CM | POA: Diagnosis not present

## 2017-05-06 NOTE — Telephone Encounter (Signed)
Called to advise [place] that hospital ordered [place] and they have gone to see patient and will call or send things if needing to report changes after patient was discharged Friday.

## 2017-05-08 DIAGNOSIS — Z87891 Personal history of nicotine dependence: Secondary | ICD-10-CM | POA: Diagnosis not present

## 2017-05-08 DIAGNOSIS — N183 Chronic kidney disease, stage 3 (moderate): Secondary | ICD-10-CM | POA: Diagnosis not present

## 2017-05-08 DIAGNOSIS — N390 Urinary tract infection, site not specified: Secondary | ICD-10-CM | POA: Diagnosis not present

## 2017-05-08 DIAGNOSIS — I129 Hypertensive chronic kidney disease with stage 1 through stage 4 chronic kidney disease, or unspecified chronic kidney disease: Secondary | ICD-10-CM | POA: Diagnosis not present

## 2017-05-08 DIAGNOSIS — E785 Hyperlipidemia, unspecified: Secondary | ICD-10-CM | POA: Diagnosis not present

## 2017-05-08 DIAGNOSIS — A0472 Enterocolitis due to Clostridium difficile, not specified as recurrent: Secondary | ICD-10-CM | POA: Diagnosis not present

## 2017-05-09 ENCOUNTER — Inpatient Hospital Stay: Payer: Medicare HMO | Admitting: Family Medicine

## 2017-05-14 DIAGNOSIS — N183 Chronic kidney disease, stage 3 (moderate): Secondary | ICD-10-CM | POA: Diagnosis not present

## 2017-05-14 DIAGNOSIS — A0472 Enterocolitis due to Clostridium difficile, not specified as recurrent: Secondary | ICD-10-CM | POA: Diagnosis not present

## 2017-05-14 DIAGNOSIS — N390 Urinary tract infection, site not specified: Secondary | ICD-10-CM | POA: Diagnosis not present

## 2017-05-14 DIAGNOSIS — E785 Hyperlipidemia, unspecified: Secondary | ICD-10-CM | POA: Diagnosis not present

## 2017-05-14 DIAGNOSIS — Z87891 Personal history of nicotine dependence: Secondary | ICD-10-CM | POA: Diagnosis not present

## 2017-05-14 DIAGNOSIS — I129 Hypertensive chronic kidney disease with stage 1 through stage 4 chronic kidney disease, or unspecified chronic kidney disease: Secondary | ICD-10-CM | POA: Diagnosis not present

## 2017-05-15 ENCOUNTER — Encounter: Payer: Self-pay | Admitting: Family Medicine

## 2017-05-15 ENCOUNTER — Ambulatory Visit (INDEPENDENT_AMBULATORY_CARE_PROVIDER_SITE_OTHER): Payer: Medicare HMO | Admitting: Family Medicine

## 2017-05-15 VITALS — BP 130/78 | HR 80 | Ht <= 58 in | Wt 109.4 lb

## 2017-05-15 DIAGNOSIS — N390 Urinary tract infection, site not specified: Secondary | ICD-10-CM

## 2017-05-15 DIAGNOSIS — M1A9XX0 Chronic gout, unspecified, without tophus (tophi): Secondary | ICD-10-CM

## 2017-05-15 DIAGNOSIS — R2681 Unsteadiness on feet: Secondary | ICD-10-CM

## 2017-05-15 DIAGNOSIS — Z7189 Other specified counseling: Secondary | ICD-10-CM

## 2017-05-15 DIAGNOSIS — Z23 Encounter for immunization: Secondary | ICD-10-CM | POA: Diagnosis not present

## 2017-05-15 DIAGNOSIS — N183 Chronic kidney disease, stage 3 unspecified: Secondary | ICD-10-CM

## 2017-05-15 DIAGNOSIS — A0472 Enterocolitis due to Clostridium difficile, not specified as recurrent: Secondary | ICD-10-CM | POA: Diagnosis not present

## 2017-05-15 DIAGNOSIS — M19042 Primary osteoarthritis, left hand: Secondary | ICD-10-CM

## 2017-05-15 DIAGNOSIS — M19041 Primary osteoarthritis, right hand: Secondary | ICD-10-CM

## 2017-05-15 DIAGNOSIS — N3946 Mixed incontinence: Secondary | ICD-10-CM

## 2017-05-15 LAB — POCT URINALYSIS DIPSTICK
BILIRUBIN UA: NEGATIVE
Glucose, UA: NEGATIVE
KETONES UA: NEGATIVE
Leukocytes, UA: NEGATIVE
Nitrite, UA: NEGATIVE
PH UA: 7 (ref 5.0–8.0)
PROTEIN UA: NEGATIVE
RBC UA: NEGATIVE
SPEC GRAV UA: 1.01 (ref 1.010–1.025)
Urobilinogen, UA: 0.2 E.U./dL

## 2017-05-15 MED ORDER — MIRABEGRON ER 25 MG PO TB24: 25.0000 mg | ORAL_TABLET | Freq: Every day | ORAL | 2 refills | Status: DC

## 2017-05-15 MED ORDER — ACETAMINOPHEN 500 MG PO TABS: 1000.0000 mg | ORAL_TABLET | Freq: Two times a day (BID) | ORAL | 0 refills | Status: DC

## 2017-05-15 NOTE — Patient Instructions (Signed)
Stop aspirin, tramadol, and Ditropan. Begin Myrbetriq and Tylenol.

## 2017-05-15 NOTE — Progress Notes (Signed)
Date:  05/15/2017   Name:  Kimberly Abbott   DOB:  09-15-1920   MRN:  [id number]  PCP:  Adline Potter, MD    Chief Complaint: Hospitalization Follow-up (Last spell was last friday. Feeling much better. ) and Immunizations (High Dose)   History of Present Illness:  This is a 83 y.o. female seen for one month f/u. Recently hospitalized for C. Diff colitis, has completed vancomycin course, occ diarrhea but much improved. Also completed Keflex course for UTI. Getting HH PT. Insomnia improved on Remeron. Taking tramadol daily for OA pain but not helping much. Weight up 3#. Has DNR order on fridge.   Review of Systems:  Review of Systems  Constitutional: Negative for chills and fever.  Respiratory: Negative for cough and shortness of breath.   Cardiovascular: Negative for chest pain.  Genitourinary: Negative for dysuria.  Neurological: Negative for syncope and light-headedness.    Patient Active Problem List   Diagnosis Date Noted  . Clostridium difficile colitis 04/30/2017  . Insomnia 04/24/2017  . Problems with swallowing and mastication   . Protein-calorie malnutrition, severe 03/10/2017  . SBO (small bowel obstruction) ([place]) 03/06/2017  . History of esophageal stricture 01/29/2017  . Hiatal hernia 01/29/2017  . Dementia 01/29/2017  . Cricopharyngeal achalasia 01/29/2017  . Vitamin D deficiency 01/29/2017  . Gait instability 01/28/2017  . Gout 01/28/2017  . Unintentional weight loss 01/28/2017  . Abnormal EKG 06/08/2016  . Primary osteoarthritis of both hands 05/03/2016  . Chronic fatigue 09/01/2015  . Rotator cuff impingement syndrome of left shoulder 09/01/2015  . Pustular psoriasis of palms and soles 05/05/2015  . Appetite impaired 02/10/2015  . Chronic kidney disease, stage III (moderate) ([place]) 02/10/2015  . Allergic rhinitis 12/31/2014  . Dermatitis, eczematoid 12/31/2014  . Hyperlipidemia 11/08/2014  . Shortness of breath 11/08/2014  . Anemia 11/08/2014  . Recurrent  UTI 11/08/2014  . Depression 11/08/2014  . Gastroesophageal reflux disease without esophagitis 11/08/2014  . Mixed urge and stress incontinence 11/08/2014  . At moderate risk for fall 11/08/2014  . Dysphagia 11/08/2014  . Rotator cuff syndrome of right shoulder 11/08/2014  . Essential hypertension 11/08/2014    Prior to Admission medications   Medication Sig Start Date End Date Taking? Authorizing Provider  allopurinol (ZYLOPRIM) 100 MG tablet Take 100 mg by mouth daily.   Yes [provider]  feeding supplement, ENSURE ENLIVE, (ENSURE ENLIVE) LIQD Take 237 mLs by mouth 2 (two) times daily between meals. 06/10/16  Yes Fritzi Mandes, MD  mirtazapine (REMERON) 15 MG tablet Take 15 mg by mouth at bedtime.   Yes [provider]  acetaminophen (TYLENOL) 500 MG tablet Take 2 tablets (1,000 mg total) by mouth 2 (two) times daily. 05/15/17   Aditri Louischarles, Gwyndolyn Saxon, MD  mirabegron ER (MYRBETRIQ) 25 MG TB24 tablet Take 1 tablet (25 mg total) by mouth daily. 05/15/17   Adline Potter, MD    Allergies  Allergen Reactions  . Penicillin G Hives  . Penicillins Hives    Has patient had a PCN reaction causing immediate rash, facial/tongue/throat swelling, SOB or lightheadedness with hypotension: No Has patient had a PCN reaction causing severe rash involving mucus membranes or skin necrosis: No Has patient had a PCN reaction that required hospitalization No Has patient had a PCN reaction occurring within the last 10 years: No If all of the above answers are "NO", then may proceed with Cephalosporin use.    Past Surgical History:  Procedure Laterality Date  . APPENDECTOMY    .  ESOPHAGOGASTRODUODENOSCOPY (EGD) WITH PROPOFOL N/A 03/12/2017   Procedure: ESOPHAGOGASTRODUODENOSCOPY (EGD) WITH PROPOFOL;  Surgeon: Lucilla Lame, MD;  Location: [facility] ENDOSCOPY;  Service: Endoscopy;  Laterality: N/A;  . rectal fistula  in the 40's  . TUBAL LIGATION      Social History  Substance Use Topics  .  Smoking status: Former Research scientist (life sciences)  . Smokeless tobacco: Never Used  . Alcohol use No    Family History  Problem Relation Age of Onset  . Diabetes Mother     Medication list has been reviewed and updated.  Physical Examination: BP 130/78   Pulse 80   Ht 4\' 9"  (1.448 m)   Wt 109 lb 6.4 oz (49.6 kg)   BMI 23.67 kg/m   Physical Exam  Constitutional: She appears well-developed and well-nourished.  Cardiovascular: Normal rate, regular rhythm and normal heart sounds.   Pulmonary/Chest: Effort normal and breath sounds normal.  Musculoskeletal: She exhibits no edema.  Neurological: She is alert.  Skin: Skin is warm and dry.  Psychiatric: She has a normal mood and affect. Her behavior is normal.  Nursing note and vitals reviewed.   Assessment and Plan:  1. Chronic kidney disease, stage III (moderate) (HCC) D/c asa - Basic Metabolic Panel (BMET) - CBC  2. Recurrent UTI UA negative today - POCT Urinalysis Dipstick  3. Clostridium difficile colitis Stable s/p vancomycin  4. Primary osteoarthritis of both hands Change tramadol to Tylenol 1000 mg bid  5. Gait instability Receiving HH PT  6. Mixed urge and stress incontinence Change Ditropan to Myrbetriq to reduce potential side effects  7. Chronic gout without tophus, unspecified cause, unspecified site Cont allopurinol  8. Advance care planning DNR on fridge  9. Need for influenza vaccination - Flu vaccine HIGH DOSE PF (Fluzone High dose)  Return in about 4 weeks (around 06/12/2017).  [name]. [facility]  05/15/2017

## 2017-05-16 DIAGNOSIS — E785 Hyperlipidemia, unspecified: Secondary | ICD-10-CM | POA: Diagnosis not present

## 2017-05-16 DIAGNOSIS — I129 Hypertensive chronic kidney disease with stage 1 through stage 4 chronic kidney disease, or unspecified chronic kidney disease: Secondary | ICD-10-CM | POA: Diagnosis not present

## 2017-05-16 DIAGNOSIS — A0472 Enterocolitis due to Clostridium difficile, not specified as recurrent: Secondary | ICD-10-CM | POA: Diagnosis not present

## 2017-05-16 DIAGNOSIS — N390 Urinary tract infection, site not specified: Secondary | ICD-10-CM | POA: Diagnosis not present

## 2017-05-16 DIAGNOSIS — Z87891 Personal history of nicotine dependence: Secondary | ICD-10-CM | POA: Diagnosis not present

## 2017-05-16 DIAGNOSIS — N183 Chronic kidney disease, stage 3 (moderate): Secondary | ICD-10-CM | POA: Diagnosis not present

## 2017-05-16 LAB — BASIC METABOLIC PANEL
BUN / CREAT RATIO: 16 (ref 12–28)
BUN: 17 mg/dL (ref 10–36)
CHLORIDE: 105 mmol/L (ref 96–106)
CO2: 23 mmol/L (ref 20–29)
Calcium: 9.7 mg/dL (ref 8.7–10.3)
Creatinine, Ser: 1.07 mg/dL — ABNORMAL HIGH (ref 0.57–1.00)
GFR calc Af Amer: 51 mL/min/{1.73_m2} — ABNORMAL LOW (ref >59)
GFR calc non Af Amer: 45 mL/min/{1.73_m2} — ABNORMAL LOW (ref >59)
GLUCOSE: 80 mg/dL (ref 65–99)
POTASSIUM: 4.3 mmol/L (ref 3.5–5.2)
SODIUM: 144 mmol/L (ref 134–144)

## 2017-05-16 LAB — CBC
HEMATOCRIT: 33.7 % — AB (ref 34.0–46.6)
Hemoglobin: 11.3 g/dL (ref 11.1–15.9)
MCH: 29.7 pg (ref 26.6–33.0)
MCHC: 33.5 g/dL (ref 31.5–35.7)
MCV: 89 fL (ref 79–97)
PLATELETS: 260 10*3/uL (ref 150–379)
RBC: 3.8 x10E6/uL (ref 3.77–5.28)
RDW: 16 % — AB (ref 12.3–15.4)
WBC: 8.1 10*3/uL (ref 3.4–10.8)

## 2017-05-17 DIAGNOSIS — E785 Hyperlipidemia, unspecified: Secondary | ICD-10-CM | POA: Diagnosis not present

## 2017-05-17 DIAGNOSIS — Z87891 Personal history of nicotine dependence: Secondary | ICD-10-CM | POA: Diagnosis not present

## 2017-05-17 DIAGNOSIS — A0472 Enterocolitis due to Clostridium difficile, not specified as recurrent: Secondary | ICD-10-CM | POA: Diagnosis not present

## 2017-05-17 DIAGNOSIS — N390 Urinary tract infection, site not specified: Secondary | ICD-10-CM | POA: Diagnosis not present

## 2017-05-17 DIAGNOSIS — N183 Chronic kidney disease, stage 3 (moderate): Secondary | ICD-10-CM | POA: Diagnosis not present

## 2017-05-17 DIAGNOSIS — I129 Hypertensive chronic kidney disease with stage 1 through stage 4 chronic kidney disease, or unspecified chronic kidney disease: Secondary | ICD-10-CM | POA: Diagnosis not present

## 2017-05-21 DIAGNOSIS — E785 Hyperlipidemia, unspecified: Secondary | ICD-10-CM | POA: Diagnosis not present

## 2017-05-21 DIAGNOSIS — N390 Urinary tract infection, site not specified: Secondary | ICD-10-CM | POA: Diagnosis not present

## 2017-05-21 DIAGNOSIS — I129 Hypertensive chronic kidney disease with stage 1 through stage 4 chronic kidney disease, or unspecified chronic kidney disease: Secondary | ICD-10-CM | POA: Diagnosis not present

## 2017-05-21 DIAGNOSIS — N183 Chronic kidney disease, stage 3 (moderate): Secondary | ICD-10-CM | POA: Diagnosis not present

## 2017-05-21 DIAGNOSIS — A0472 Enterocolitis due to Clostridium difficile, not specified as recurrent: Secondary | ICD-10-CM | POA: Diagnosis not present

## 2017-05-21 DIAGNOSIS — Z87891 Personal history of nicotine dependence: Secondary | ICD-10-CM | POA: Diagnosis not present

## 2017-05-22 DIAGNOSIS — Z87891 Personal history of nicotine dependence: Secondary | ICD-10-CM | POA: Diagnosis not present

## 2017-05-22 DIAGNOSIS — N183 Chronic kidney disease, stage 3 (moderate): Secondary | ICD-10-CM | POA: Diagnosis not present

## 2017-05-22 DIAGNOSIS — E785 Hyperlipidemia, unspecified: Secondary | ICD-10-CM | POA: Diagnosis not present

## 2017-05-22 DIAGNOSIS — N390 Urinary tract infection, site not specified: Secondary | ICD-10-CM | POA: Diagnosis not present

## 2017-05-22 DIAGNOSIS — A0472 Enterocolitis due to Clostridium difficile, not specified as recurrent: Secondary | ICD-10-CM | POA: Diagnosis not present

## 2017-05-22 DIAGNOSIS — I129 Hypertensive chronic kidney disease with stage 1 through stage 4 chronic kidney disease, or unspecified chronic kidney disease: Secondary | ICD-10-CM | POA: Diagnosis not present

## 2017-05-24 DIAGNOSIS — A0472 Enterocolitis due to Clostridium difficile, not specified as recurrent: Secondary | ICD-10-CM | POA: Diagnosis not present

## 2017-05-24 DIAGNOSIS — N183 Chronic kidney disease, stage 3 (moderate): Secondary | ICD-10-CM | POA: Diagnosis not present

## 2017-05-24 DIAGNOSIS — I129 Hypertensive chronic kidney disease with stage 1 through stage 4 chronic kidney disease, or unspecified chronic kidney disease: Secondary | ICD-10-CM | POA: Diagnosis not present

## 2017-05-24 DIAGNOSIS — Z87891 Personal history of nicotine dependence: Secondary | ICD-10-CM | POA: Diagnosis not present

## 2017-05-24 DIAGNOSIS — N390 Urinary tract infection, site not specified: Secondary | ICD-10-CM | POA: Diagnosis not present

## 2017-05-24 DIAGNOSIS — E785 Hyperlipidemia, unspecified: Secondary | ICD-10-CM | POA: Diagnosis not present

## 2017-05-28 DIAGNOSIS — E785 Hyperlipidemia, unspecified: Secondary | ICD-10-CM | POA: Diagnosis not present

## 2017-05-28 DIAGNOSIS — Z87891 Personal history of nicotine dependence: Secondary | ICD-10-CM | POA: Diagnosis not present

## 2017-05-28 DIAGNOSIS — N390 Urinary tract infection, site not specified: Secondary | ICD-10-CM | POA: Diagnosis not present

## 2017-05-28 DIAGNOSIS — A0472 Enterocolitis due to Clostridium difficile, not specified as recurrent: Secondary | ICD-10-CM | POA: Diagnosis not present

## 2017-05-28 DIAGNOSIS — I129 Hypertensive chronic kidney disease with stage 1 through stage 4 chronic kidney disease, or unspecified chronic kidney disease: Secondary | ICD-10-CM | POA: Diagnosis not present

## 2017-05-28 DIAGNOSIS — N183 Chronic kidney disease, stage 3 (moderate): Secondary | ICD-10-CM | POA: Diagnosis not present

## 2017-05-29 DIAGNOSIS — N390 Urinary tract infection, site not specified: Secondary | ICD-10-CM | POA: Diagnosis not present

## 2017-05-29 DIAGNOSIS — E785 Hyperlipidemia, unspecified: Secondary | ICD-10-CM | POA: Diagnosis not present

## 2017-05-29 DIAGNOSIS — Z87891 Personal history of nicotine dependence: Secondary | ICD-10-CM | POA: Diagnosis not present

## 2017-05-29 DIAGNOSIS — N183 Chronic kidney disease, stage 3 (moderate): Secondary | ICD-10-CM | POA: Diagnosis not present

## 2017-05-29 DIAGNOSIS — A0472 Enterocolitis due to Clostridium difficile, not specified as recurrent: Secondary | ICD-10-CM | POA: Diagnosis not present

## 2017-05-29 DIAGNOSIS — I129 Hypertensive chronic kidney disease with stage 1 through stage 4 chronic kidney disease, or unspecified chronic kidney disease: Secondary | ICD-10-CM | POA: Diagnosis not present

## 2017-05-30 DIAGNOSIS — N183 Chronic kidney disease, stage 3 (moderate): Secondary | ICD-10-CM | POA: Diagnosis not present

## 2017-05-30 DIAGNOSIS — N390 Urinary tract infection, site not specified: Secondary | ICD-10-CM | POA: Diagnosis not present

## 2017-05-30 DIAGNOSIS — I129 Hypertensive chronic kidney disease with stage 1 through stage 4 chronic kidney disease, or unspecified chronic kidney disease: Secondary | ICD-10-CM | POA: Diagnosis not present

## 2017-05-30 DIAGNOSIS — Z87891 Personal history of nicotine dependence: Secondary | ICD-10-CM | POA: Diagnosis not present

## 2017-05-30 DIAGNOSIS — A0472 Enterocolitis due to Clostridium difficile, not specified as recurrent: Secondary | ICD-10-CM | POA: Diagnosis not present

## 2017-05-30 DIAGNOSIS — E785 Hyperlipidemia, unspecified: Secondary | ICD-10-CM | POA: Diagnosis not present

## 2017-06-03 DIAGNOSIS — N390 Urinary tract infection, site not specified: Secondary | ICD-10-CM | POA: Diagnosis not present

## 2017-06-03 DIAGNOSIS — Z87891 Personal history of nicotine dependence: Secondary | ICD-10-CM | POA: Diagnosis not present

## 2017-06-03 DIAGNOSIS — I129 Hypertensive chronic kidney disease with stage 1 through stage 4 chronic kidney disease, or unspecified chronic kidney disease: Secondary | ICD-10-CM | POA: Diagnosis not present

## 2017-06-03 DIAGNOSIS — N183 Chronic kidney disease, stage 3 (moderate): Secondary | ICD-10-CM | POA: Diagnosis not present

## 2017-06-03 DIAGNOSIS — A0472 Enterocolitis due to Clostridium difficile, not specified as recurrent: Secondary | ICD-10-CM | POA: Diagnosis not present

## 2017-06-03 DIAGNOSIS — E785 Hyperlipidemia, unspecified: Secondary | ICD-10-CM | POA: Diagnosis not present

## 2017-06-07 ENCOUNTER — Ambulatory Visit: Payer: Medicare HMO | Admitting: Podiatry

## 2017-06-07 DIAGNOSIS — A0472 Enterocolitis due to Clostridium difficile, not specified as recurrent: Secondary | ICD-10-CM | POA: Diagnosis not present

## 2017-06-07 DIAGNOSIS — N183 Chronic kidney disease, stage 3 (moderate): Secondary | ICD-10-CM | POA: Diagnosis not present

## 2017-06-07 DIAGNOSIS — E785 Hyperlipidemia, unspecified: Secondary | ICD-10-CM | POA: Diagnosis not present

## 2017-06-07 DIAGNOSIS — N390 Urinary tract infection, site not specified: Secondary | ICD-10-CM | POA: Diagnosis not present

## 2017-06-07 DIAGNOSIS — Z87891 Personal history of nicotine dependence: Secondary | ICD-10-CM | POA: Diagnosis not present

## 2017-06-07 DIAGNOSIS — I129 Hypertensive chronic kidney disease with stage 1 through stage 4 chronic kidney disease, or unspecified chronic kidney disease: Secondary | ICD-10-CM | POA: Diagnosis not present

## 2017-06-10 DIAGNOSIS — Z87891 Personal history of nicotine dependence: Secondary | ICD-10-CM | POA: Diagnosis not present

## 2017-06-10 DIAGNOSIS — N183 Chronic kidney disease, stage 3 (moderate): Secondary | ICD-10-CM | POA: Diagnosis not present

## 2017-06-10 DIAGNOSIS — A0472 Enterocolitis due to Clostridium difficile, not specified as recurrent: Secondary | ICD-10-CM | POA: Diagnosis not present

## 2017-06-10 DIAGNOSIS — E785 Hyperlipidemia, unspecified: Secondary | ICD-10-CM | POA: Diagnosis not present

## 2017-06-10 DIAGNOSIS — N390 Urinary tract infection, site not specified: Secondary | ICD-10-CM | POA: Diagnosis not present

## 2017-06-10 DIAGNOSIS — I129 Hypertensive chronic kidney disease with stage 1 through stage 4 chronic kidney disease, or unspecified chronic kidney disease: Secondary | ICD-10-CM | POA: Diagnosis not present

## 2017-06-12 ENCOUNTER — Ambulatory Visit: Payer: Medicare HMO | Admitting: Family Medicine

## 2017-06-12 ENCOUNTER — Encounter: Payer: Self-pay | Admitting: Family Medicine

## 2017-06-12 VITALS — BP 120/88 | HR 77 | Resp 16 | Ht <= 58 in | Wt 109.0 lb

## 2017-06-12 DIAGNOSIS — L309 Dermatitis, unspecified: Secondary | ICD-10-CM

## 2017-06-12 DIAGNOSIS — K921 Melena: Secondary | ICD-10-CM

## 2017-06-12 DIAGNOSIS — M1A9XX0 Chronic gout, unspecified, without tophus (tophi): Secondary | ICD-10-CM

## 2017-06-12 DIAGNOSIS — N183 Chronic kidney disease, stage 3 unspecified: Secondary | ICD-10-CM

## 2017-06-12 DIAGNOSIS — R69 Illness, unspecified: Secondary | ICD-10-CM | POA: Diagnosis not present

## 2017-06-12 DIAGNOSIS — N3946 Mixed incontinence: Secondary | ICD-10-CM

## 2017-06-12 DIAGNOSIS — F5101 Primary insomnia: Secondary | ICD-10-CM | POA: Diagnosis not present

## 2017-06-12 MED ORDER — MIRTAZAPINE 15 MG PO TABS: 15.0000 mg | ORAL_TABLET | Freq: Every day | ORAL | 2 refills | Status: DC

## 2017-06-12 MED ORDER — CETAPHIL EX CREA: TOPICAL_CREAM | Freq: Two times a day (BID) | CUTANEOUS | 0 refills | Status: DC

## 2017-06-12 MED ORDER — OXYBUTYNIN CHLORIDE 5 MG PO TABS
5.0000 mg | ORAL_TABLET | Freq: Two times a day (BID) | ORAL | Status: DC
Start: 2017-06-12 — End: 2017-09-18

## 2017-06-12 MED ORDER — ACETAMINOPHEN 500 MG PO TABS: 1000.0000 mg | ORAL_TABLET | Freq: Two times a day (BID) | ORAL | 0 refills | Status: AC | PRN

## 2017-06-12 NOTE — Progress Notes (Signed)
Date:  06/12/2017   Name:  Kimberly Abbott   DOB:  23-Sep-1920   MRN:  [id number]  PCP:  Adline Potter, MD    Chief Complaint: Chronic Kidney Disease (Follow Up) and Over Active Bladder (stopped Myrbetrix due to diarrhea and cost )   History of Present Illness:  This is a 83 y.o. female seen for one month f/u. Did not tolerate Myrbetriq due to diarrhea, took only 3 days, back on Ditropan bid. Stools still loose and black, not taking Pepto-Bismol or iron supplement. Feeling a little weaker and less steady, not sleeping as well, requests something for nerves. OA pain ok off tramadol, C/o intermittent itching across back, lubricating cream seems to help.   Review of Systems:  Review of Systems  Constitutional: Negative for chills and fever.  Respiratory: Negative for cough and shortness of breath.   Cardiovascular: Negative for chest pain and leg swelling.  Genitourinary: Negative for difficulty urinating.  Neurological: Negative for syncope and light-headedness.    Patient Active Problem List   Diagnosis Date Noted  . Clostridium difficile colitis 04/30/2017  . Insomnia 04/24/2017  . Problems with swallowing and mastication   . SBO (small bowel obstruction) ([place]) 03/06/2017  . History of esophageal stricture 01/29/2017  . Hiatal hernia 01/29/2017  . Dementia 01/29/2017  . Cricopharyngeal achalasia 01/29/2017  . Vitamin D deficiency 01/29/2017  . Gait instability 01/28/2017  . Gout 01/28/2017  . Primary osteoarthritis of both hands 05/03/2016  . Chronic fatigue 09/01/2015  . Rotator cuff impingement syndrome of left shoulder 09/01/2015  . Pustular psoriasis of palms and soles 05/05/2015  . Chronic kidney disease, stage III (moderate) ([place]) 02/10/2015  . Allergic rhinitis 12/31/2014  . Dermatitis, eczematoid 12/31/2014  . Hyperlipidemia 11/08/2014  . Anemia 11/08/2014  . Recurrent UTI 11/08/2014  . Depression 11/08/2014  . Gastroesophageal reflux disease without esophagitis  11/08/2014  . Mixed urge and stress incontinence 11/08/2014  . At moderate risk for fall 11/08/2014  . Dysphagia 11/08/2014  . Rotator cuff syndrome of right shoulder 11/08/2014  . Essential hypertension 11/08/2014    Prior to Admission medications   Medication Sig Start Date End Date Taking? Authorizing Provider  acetaminophen (TYLENOL) 500 MG tablet Take 2 tablets (1,000 mg total) 2 (two) times daily as needed by mouth. 06/12/17  Yes Kristyna Bradstreet, Gwyndolyn Saxon, MD  allopurinol (ZYLOPRIM) 100 MG tablet Take 100 mg by mouth daily.   Yes [provider]  feeding supplement, ENSURE ENLIVE, (ENSURE ENLIVE) LIQD Take 237 mLs by mouth 2 (two) times daily between meals. 06/10/16  Yes Fritzi Mandes, MD  mirtazapine (REMERON) 15 MG tablet Take 1 tablet (15 mg total) at bedtime by mouth. 06/12/17  Yes Hallel Denherder, Gwyndolyn Saxon, MD  cetaphil (CETAPHIL) cream Apply 2 (two) times daily topically. And after each bath/shower 06/12/17   Ladye Macnaughton, Gwyndolyn Saxon, MD  oxybutynin (DITROPAN) 5 MG tablet Take 1 tablet (5 mg total) 2 (two) times daily by mouth. 06/12/17   Adline Potter, MD    Allergies  Allergen Reactions  . Penicillin G Hives  . Penicillins Hives    Has patient had a PCN reaction causing immediate rash, facial/tongue/throat swelling, SOB or lightheadedness with hypotension: No Has patient had a PCN reaction causing severe rash involving mucus membranes or skin necrosis: No Has patient had a PCN reaction that required hospitalization No Has patient had a PCN reaction occurring within the last 10 years: No If all of the above answers are "NO", then may proceed with Cephalosporin use.  Past Surgical History:  Procedure Laterality Date  . APPENDECTOMY    . rectal fistula  in the 40's  . TUBAL LIGATION      Social History   Tobacco Use  . Smoking status: Former Research scientist (life sciences)  . Smokeless tobacco: Never Used  Substance Use Topics  . Alcohol use: No    Alcohol/week: 0.0 oz  . Drug use: No    Family History   Problem Relation Age of Onset  . Diabetes Mother     Medication list has been reviewed and updated.  Physical Examination: BP 120/88   Pulse 77   Resp 16   Ht 4\' 9"  (1.448 m)   Wt 109 lb (49.4 kg)   SpO2 98%   BMI 23.59 kg/m   Physical Exam  Constitutional: She appears well-developed and well-nourished.  Cardiovascular: Normal rate, regular rhythm and normal heart sounds.  Pulmonary/Chest: Effort normal and breath sounds normal.  Genitourinary:  Genitourinary Comments: Rectal no mass, no stool  Musculoskeletal: She exhibits no edema.  Neurological: She is alert.  Skin: Skin is warm and dry. No rash noted.  Psychiatric: She has a normal mood and affect. Her behavior is normal.  Nursing note and vitals reviewed.   Assessment and Plan:  1. Chronic kidney disease, stage III (moderate) (HCC) Off asa - Basic Metabolic Panel (BMET)  2. Melena New onset, doubt GIB - CBC  3. Eczema, unspecified type Cetaphil bid and after/bath/shower  4. Mixed urge and stress incontinence Intolerant Myrbetriq, back on Ditropan bid  5. Chronic gout without tophus, unspecified cause, unspecified site On allopurinol, no recent flare  6. Primary insomnia Increase Remeron to 15 mg qhs, declined anxiety med  Return in about 4 weeks (around 07/10/2017).  [name]. [facility]  06/12/2017

## 2017-06-13 DIAGNOSIS — E785 Hyperlipidemia, unspecified: Secondary | ICD-10-CM | POA: Diagnosis not present

## 2017-06-13 DIAGNOSIS — A0472 Enterocolitis due to Clostridium difficile, not specified as recurrent: Secondary | ICD-10-CM | POA: Diagnosis not present

## 2017-06-13 DIAGNOSIS — N183 Chronic kidney disease, stage 3 (moderate): Secondary | ICD-10-CM | POA: Diagnosis not present

## 2017-06-13 DIAGNOSIS — Z87891 Personal history of nicotine dependence: Secondary | ICD-10-CM | POA: Diagnosis not present

## 2017-06-13 DIAGNOSIS — I129 Hypertensive chronic kidney disease with stage 1 through stage 4 chronic kidney disease, or unspecified chronic kidney disease: Secondary | ICD-10-CM | POA: Diagnosis not present

## 2017-06-13 DIAGNOSIS — N390 Urinary tract infection, site not specified: Secondary | ICD-10-CM | POA: Diagnosis not present

## 2017-06-13 LAB — CBC
Hematocrit: 36.8 % (ref 34.0–46.6)
Hemoglobin: 11.8 g/dL (ref 11.1–15.9)
MCH: 30 pg (ref 26.6–33.0)
MCHC: 32.1 g/dL (ref 31.5–35.7)
MCV: 94 fL (ref 79–97)
Platelets: 217 10*3/uL (ref 150–379)
RBC: 3.93 x10E6/uL (ref 3.77–5.28)
RDW: 15.2 % (ref 12.3–15.4)
WBC: 7.2 10*3/uL (ref 3.4–10.8)

## 2017-06-13 LAB — BASIC METABOLIC PANEL
BUN / CREAT RATIO: 16 (ref 12–28)
BUN: 20 mg/dL (ref 10–36)
CALCIUM: 9.9 mg/dL (ref 8.7–10.3)
CHLORIDE: 108 mmol/L — AB (ref 96–106)
CO2: 24 mmol/L (ref 20–29)
Creatinine, Ser: 1.23 mg/dL — ABNORMAL HIGH (ref 0.57–1.00)
GFR calc non Af Amer: 38 mL/min/{1.73_m2} — ABNORMAL LOW (ref >59)
GFR, EST AFRICAN AMERICAN: 43 mL/min/{1.73_m2} — AB (ref >59)
GLUCOSE: 82 mg/dL (ref 65–99)
POTASSIUM: 4.2 mmol/L (ref 3.5–5.2)
Sodium: 145 mmol/L — ABNORMAL HIGH (ref 134–144)

## 2017-06-14 ENCOUNTER — Ambulatory Visit: Payer: Medicare HMO | Admitting: Podiatry

## 2017-06-14 DIAGNOSIS — B351 Tinea unguium: Secondary | ICD-10-CM | POA: Diagnosis not present

## 2017-06-14 DIAGNOSIS — M79676 Pain in unspecified toe(s): Secondary | ICD-10-CM

## 2017-06-17 NOTE — Progress Notes (Signed)
   SUBJECTIVE Patient presents to office today complaining of elongated, thickened nails. Pain while ambulating in shoes. Patient is unable to trim their own nails.   Past Medical History:  Diagnosis Date  . Allergy   . CKD (chronic kidney disease)   . GERD (gastroesophageal reflux disease)   . Hyperlipidemia   . Hypertension     OBJECTIVE General Patient is awake, alert, and oriented x 3 and in no acute distress. Derm Skin is dry and supple bilateral. Negative open lesions or macerations. Remaining integument unremarkable. Nails are tender, long, thickened and dystrophic with subungual debris, consistent with onychomycosis, 1-5 bilateral. No signs of infection noted. Vasc  DP and PT pedal pulses palpable bilaterally. Temperature gradient within normal limits.  Neuro Epicritic and protective threshold sensation diminished bilaterally.  Musculoskeletal Exam No symptomatic pedal deformities noted bilateral. Muscular strength within normal limits.  ASSESSMENT 1. Onychodystrophic nails 1-5 bilateral with hyperkeratosis of nails.  2. Onychomycosis of nail due to dermatophyte bilateral 3. Pain in foot bilateral  PLAN OF CARE 1. Patient evaluated today.  2. Instructed to maintain good pedal hygiene and foot care.  3. Mechanical debridement of nails 1-5 bilaterally performed using a nail nipper. Filed with dremel without incident.  4. Return to clinic in 3 mos.    Edrick Kins, DPM [facility]  Dr. Edrick Kins, [address]                                        [place], [place] [address]                Office [phone]  Fax [phone]

## 2017-07-15 ENCOUNTER — Ambulatory Visit (INDEPENDENT_AMBULATORY_CARE_PROVIDER_SITE_OTHER): Payer: Medicare HMO | Admitting: Family Medicine

## 2017-07-15 ENCOUNTER — Encounter: Payer: Self-pay | Admitting: Family Medicine

## 2017-07-15 ENCOUNTER — Ambulatory Visit (INDEPENDENT_AMBULATORY_CARE_PROVIDER_SITE_OTHER): Payer: Medicare HMO

## 2017-07-15 VITALS — BP 116/60 | HR 76 | Temp 97.9°F | Resp 16 | Ht <= 58 in | Wt 107.2 lb

## 2017-07-15 VITALS — BP 116/60 | HR 78 | Temp 97.9°F | Resp 16 | Ht <= 58 in | Wt 107.0 lb

## 2017-07-15 DIAGNOSIS — R634 Abnormal weight loss: Secondary | ICD-10-CM | POA: Diagnosis not present

## 2017-07-15 DIAGNOSIS — I10 Essential (primary) hypertension: Secondary | ICD-10-CM

## 2017-07-15 DIAGNOSIS — N183 Chronic kidney disease, stage 3 unspecified: Secondary | ICD-10-CM

## 2017-07-15 DIAGNOSIS — M1A9XX0 Chronic gout, unspecified, without tophus (tophi): Secondary | ICD-10-CM | POA: Diagnosis not present

## 2017-07-15 DIAGNOSIS — Z0000 Encounter for general adult medical examination without abnormal findings: Secondary | ICD-10-CM

## 2017-07-15 DIAGNOSIS — N3946 Mixed incontinence: Secondary | ICD-10-CM

## 2017-07-15 NOTE — Patient Instructions (Signed)
Kimberly Abbott , Thank you for taking time to come for your Medicare Wellness Visit. I appreciate your ongoing commitment to your health goals. Please review the following plan we discussed and let me know if I can assist you in the future.   Screening recommendations/referrals: Colonoscopy: Colon cancer screenings no longer required Mammogram: Breast cancer screenings no longer required Bone Density: Osteoporotic screenings no longer required Lung: Lung cancer screenings no longer required  Vision/Dental Exams: Recommended yearly ophthalmology/optometry visit for glaucoma screening and checkup Recommended yearly dental visit for hygiene and checkup  Vaccinations: Influenza vaccine: Completed 05/15/17 Pneumococcal vaccine: Completed PCV13 01/28/17. PPSV23 not required until on or after 01/28/18 Tdap vaccine: Declined. Please call your insurance company to determine your out of pocket expense. You may also receive this vaccine at your local pharmacy or Health Dept. Shingles vaccine: Declined. Please call your insurance company to determine your out of pocket expense. You may also receive this vaccine at your local pharmacy or Health Dept.  Advanced directives: Advance directive discussed with you today. I have provided a copy for you to complete at home and have notarized. Once this is complete please bring a copy in to our office so we can scan it into your chart.  Conditions/risks identified: Recommend to drink at least 6-8 8oz glasses of water per day  Next appointment: You are scheduled to see Dr. Vicente Masson on 07/15/17 @ 2:45pm.   Please schedule your Annual Wellness Visit with your Nurse Health Advisor in one year.  Preventive Care 32 Years and Older, Female Preventive care refers to lifestyle choices and visits with your health care provider that can promote health and wellness. What does preventive care include?  A yearly physical exam. This is also called an annual well check.  Dental  exams once or twice a year.  Routine eye exams. Ask your health care provider how often you should have your eyes checked.  Personal lifestyle choices, including:  Daily care of your teeth and gums.  Regular physical activity.  Eating a healthy diet.  Avoiding tobacco and drug use.  Limiting alcohol use.  Practicing safe sex.  Taking low-dose aspirin every day.  Taking vitamin and mineral supplements as recommended by your health care provider. What happens during an annual well check? The services and screenings done by your health care provider during your annual well check will depend on your age, overall health, lifestyle risk factors, and family history of disease. Counseling  Your health care provider may ask you questions about your:  Alcohol use.  Tobacco use.  Drug use.  Emotional well-being.  Home and relationship well-being.  Sexual activity.  Eating habits.  History of falls.  Memory and ability to understand (cognition).  Work and work Statistician.  Reproductive health. Screening  You may have the following tests or measurements:  Height, weight, and BMI.  Blood pressure.  Lipid and cholesterol levels. These may be checked every 5 years, or more frequently if you are over 41 years old.  Skin check.  Lung cancer screening. You may have this screening every year starting at age 61 if you have a 30-pack-year history of smoking and currently smoke or have quit within the past 15 years.  Fecal occult blood test (FOBT) of the stool. You may have this test every year starting at age 14.  Flexible sigmoidoscopy or colonoscopy. You may have a sigmoidoscopy every 5 years or a colonoscopy every 10 years starting at age 61.  Hepatitis C blood test.  Hepatitis B blood test.  Sexually transmitted disease (STD) testing.  Diabetes screening. This is done by checking your blood sugar (glucose) after you have not eaten for a while (fasting). You may  have this done every 1-3 years.  Bone density scan. This is done to screen for osteoporosis. You may have this done starting at age 60.  Mammogram. This may be done every 1-2 years. Talk to your health care provider about how often you should have regular mammograms. Talk with your health care provider about your test results, treatment options, and if necessary, the need for more tests. Vaccines  Your health care provider may recommend certain vaccines, such as:  Influenza vaccine. This is recommended every year.  Tetanus, diphtheria, and acellular pertussis (Tdap, Td) vaccine. You may need a Td booster every 10 years.  Zoster vaccine. You may need this after age 70.  Pneumococcal 13-valent conjugate (PCV13) vaccine. One dose is recommended after age 45.  Pneumococcal polysaccharide (PPSV23) vaccine. One dose is recommended after age 71. Talk to your health care provider about which screenings and vaccines you need and how often you need them. This information is not intended to replace advice given to you by your health care provider. Make sure you discuss any questions you have with your health care provider. Document Released: 08/12/2015 Document Revised: 04/04/2016 Document Reviewed: 05/17/2015 Elsevier Interactive Patient Education  2017 [employer] Prevention in the Home Falls can cause injuries. They can happen to people of all ages. There are many things you can do to make your home safe and to help prevent falls. What can I do on the outside of my home?  Regularly fix the edges of walkways and driveways and fix any cracks.  Remove anything that might make you trip as you walk through a door, such as a raised step or threshold.  Trim any bushes or trees on the path to your home.  Use bright outdoor lighting.  Clear any walking paths of anything that might make someone trip, such as rocks or tools.  Regularly check to see if handrails are loose or broken. Make  sure that both sides of any steps have handrails.  Any raised decks and porches should have guardrails on the edges.  Have any leaves, snow, or ice cleared regularly.  Use sand or salt on walking paths during winter.  Clean up any spills in your garage right away. This includes oil or grease spills. What can I do in the bathroom?  Use night lights.  Install grab bars by the toilet and in the tub and shower. Do not use towel bars as grab bars.  Use non-skid mats or decals in the tub or shower.  If you need to sit down in the shower, use a plastic, non-slip stool.  Keep the floor dry. Clean up any water that spills on the floor as soon as it happens.  Remove soap buildup in the tub or shower regularly.  Attach bath mats securely with double-sided non-slip rug tape.  Do not have throw rugs and other things on the floor that can make you trip. What can I do in the bedroom?  Use night lights.  Make sure that you have a light by your bed that is easy to reach.  Do not use any sheets or blankets that are too big for your bed. They should not hang down onto the floor.  Have a firm chair that has side arms. You can use this  for support while you get dressed.  Do not have throw rugs and other things on the floor that can make you trip. What can I do in the kitchen?  Clean up any spills right away.  Avoid walking on wet floors.  Keep items that you use a lot in easy-to-reach places.  If you need to reach something above you, use a strong step stool that has a grab bar.  Keep electrical cords out of the way.  Do not use floor polish or wax that makes floors slippery. If you must use wax, use non-skid floor wax.  Do not have throw rugs and other things on the floor that can make you trip. What can I do with my stairs?  Do not leave any items on the stairs.  Make sure that there are handrails on both sides of the stairs and use them. Fix handrails that are broken or loose.  Make sure that handrails are as long as the stairways.  Check any carpeting to make sure that it is firmly attached to the stairs. Fix any carpet that is loose or worn.  Avoid having throw rugs at the top or bottom of the stairs. If you do have throw rugs, attach them to the floor with carpet tape.  Make sure that you have a light switch at the top of the stairs and the bottom of the stairs. If you do not have them, ask someone to add them for you. What else can I do to help prevent falls?  Wear shoes that:  Do not have high heels.  Have rubber bottoms.  Are comfortable and fit you well.  Are closed at the toe. Do not wear sandals.  If you use a stepladder:  Make sure that it is fully opened. Do not climb a closed stepladder.  Make sure that both sides of the stepladder are locked into place.  Ask someone to hold it for you, if possible.  Clearly mark and make sure that you can see:  Any grab bars or handrails.  First and last steps.  Where the edge of each step is.  Use tools that help you move around (mobility aids) if they are needed. These include:  Canes.  Walkers.  Scooters.  Crutches.  Turn on the lights when you go into a dark area. Replace any light bulbs as soon as they burn out.  Set up your furniture so you have a clear path. Avoid moving your furniture around.  If any of your floors are uneven, fix them.  If there are any pets around you, be aware of where they are.  Review your medicines with your doctor. Some medicines can make you feel dizzy. This can increase your chance of falling. Ask your doctor what other things that you can do to help prevent falls. This information is not intended to replace advice given to you by your health care provider. Make sure you discuss any questions you have with your health care provider. Document Released: 05/12/2009 Document Revised: 12/22/2015 Document Reviewed: 08/20/2014 Elsevier Interactive Patient  Education  2017 [employer].

## 2017-07-15 NOTE — Progress Notes (Signed)
Subjective:   Kimberly Abbott is a 83 y.o. female who presents for Medicare Annual (Subsequent) preventive examination.  Review of Systems:  N/A Cardiac Risk Factors include: sedentary lifestyle;advanced age (>48men, >4 women);hypertension;dyslipidemia     Objective:     Vitals: BP 116/60 (BP Location: Right Arm, Patient Position: Sitting, Cuff Size: Normal)   Pulse 76   Temp 97.9 F (36.6 C) (Oral)   Resp 16   Ht 4\' 9"  (1.448 m)   Wt 107 lb 3.2 oz (48.6 kg)   BMI 23.20 kg/m   Body mass index is 23.2 kg/m.  Advanced Directives 07/15/2017 05/15/2017 05/01/2017 04/30/2017 03/06/2017 03/06/2017 03/06/2017  Does Patient Have a Medical Advance Directive? No Yes No No Yes Yes Yes  Type of Advance Directive - Out of facility DNR (pink MOST or yellow form) - - Press photographer;Living will [place];Living will [place];Living will  Does patient want to make changes to medical advance directive? - No - Patient declined - - No - Patient declined - -  Copy of [place] in Chart? - - - - No - copy requested No - copy requested No - copy requested  Would patient like information on creating a medical advance directive? Yes (MAU/Ambulatory/Procedural Areas - Information given) No - Patient declined No - Patient declined - - - -    Tobacco Social History   Tobacco Use  Smoking Status Former Smoker  . Packs/day: 0.25  . Years: 40.00  . Pack years: 10.00  . Types: Cigarettes  . Last attempt to quit: 1980  . Years since quitting: 38.9  Smokeless Tobacco Never Used  Tobacco Comment   Smoking cessation materials not required     Counseling given: No Comment: Smoking cessation materials not required   Clinical Intake:  Pre-visit preparation completed: Yes  Pain : No/denies pain  BMI - recorded: 23.2 Nutritional Status: BMI of 19-24  Normal Nutritional Risks: None Diabetes: No  How often do you need to have  someone help you when you read instructions, pamphlets, or other written materials from your doctor or pharmacy?: 1 - Never  Interpreter Needed?: No  Information entered by :: AEversole, LPN  Past Medical History:  Diagnosis Date  . Allergy   . CKD (chronic kidney disease)   . GERD (gastroesophageal reflux disease)   . Hyperlipidemia   . Hypertension    Past Surgical History:  Procedure Laterality Date  . APPENDECTOMY    . ESOPHAGOGASTRODUODENOSCOPY (EGD) WITH PROPOFOL N/A 03/12/2017   Procedure: ESOPHAGOGASTRODUODENOSCOPY (EGD) WITH PROPOFOL;  Surgeon: Lucilla Lame, MD;  Location: [facility] ENDOSCOPY;  Service: Endoscopy;  Laterality: N/A;  . rectal fistula  in the 40's  . TUBAL LIGATION     Family History  Problem Relation Age of Onset  . Diabetes Mother    Social History   Socioeconomic History  . Marital status: Widowed    Spouse name: None  . Number of children: 1  . Years of education: None  . Highest education level: 12th grade  Social Needs  . Financial resource strain: Not hard at all  . Food insecurity - worry: Never true  . Food insecurity - inability: Never true  . Transportation needs - medical: No  . Transportation needs - non-medical: No  Occupational History  . Occupation: Retired  Tobacco Use  . Smoking status: Former Smoker    Packs/day: 0.25    Years: 40.00    Pack years: 10.00  Types: Cigarettes    Last attempt to quit: 1980    Years since quitting: 38.9  . Smokeless tobacco: Never Used  . Tobacco comment: Smoking cessation materials not required  Substance and Sexual Activity  . Alcohol use: No    Alcohol/week: 0.0 oz  . Drug use: No  . Sexual activity: Not Currently  Other Topics Concern  . None  Social History Narrative  . None    Outpatient Encounter Medications as of 07/15/2017  Medication Sig  . allopurinol (ZYLOPRIM) 100 MG tablet Take 100 mg by mouth daily.  . cetaphil (CETAPHIL) cream Apply 2 (two) times daily topically. And  after each bath/shower  . feeding supplement, ENSURE ENLIVE, (ENSURE ENLIVE) LIQD Take 237 mLs by mouth 2 (two) times daily between meals.  . mirtazapine (REMERON) 15 MG tablet Take 1 tablet (15 mg total) at bedtime by mouth.  . oxybutynin (DITROPAN) 5 MG tablet Take 1 tablet (5 mg total) 2 (two) times daily by mouth.  Marland Kitchen acetaminophen (TYLENOL) 500 MG tablet Take 2 tablets (1,000 mg total) 2 (two) times daily as needed by mouth. (Patient not taking: Reported on 07/15/2017)   No facility-administered encounter medications on file as of 07/15/2017.     Activities of Daily Living In your present state of health, do you have any difficulty performing the following activities: 07/15/2017 05/01/2017  Hearing? N Y  Comment Denies wearing hearing aids -  Vision? N Y  Comment wears eye glasses -  Difficulty concentrating or making decisions? Y N  Comment Gradually unable to recall things from the past. -  Walking or climbing stairs? Y N  Comment exhaustion -  Dressing or bathing? N N  Doing errands, shopping? N Y  Conservation officer, nature and eating ? N -  Comment partial upper and lower dentures -  Using the Toilet? N -  In the past six months, have you accidently leaked urine? Y -  Comment strong urge to void -  Do you have problems with loss of bowel control? N -  Managing your Medications? N -  Managing your Finances? N -  Housekeeping or managing your Housekeeping? N -  Some recent data might be hidden    Patient Care Team: Adline Potter, MD as PCP - General (Family Medicine) Edrick Kins, DPM as Consulting Physician (Podiatry)    Assessment:   This is a routine wellness examination for [place].  Exercise Activities and Dietary recommendations Current Exercise Habits: The patient does not participate in regular exercise at present, Exercise limited by: None identified  Goals    . DIET - INCREASE WATER INTAKE     Recommend to drink at least 6-8 8oz glasses of water per day         Fall Risk Fall Risk  07/15/2017 03/01/2017 01/28/2017 12/31/2014  Falls in the past year? No No No Yes  Number falls in past yr: - - - 2 or more  Injury with Fall? - - - No  Risk for fall due to : - - - Other (Comment)  Follow up - - - Falls evaluation completed;Falls prevention discussed;Education provided   Is the patient's home free of loose throw rugs in walkways, pet beds, electrical cords, etc?   yes      Grab bars in the bathroom? yes      Handrails on the stairs?   No. Does not have any stairs in the home      Adequate lighting?   yes  Denies  use of elevated toilet seat. Uses shower chair to shower.  Timed Get Up and Go performed: Yes. 15 sec with use of cane. Gait steady. No intervention required at this time.  Depression Screen PHQ 2/9 Scores 07/15/2017 03/01/2017 01/28/2017 12/31/2014  PHQ - 2 Score 2 1 0 0  PHQ- 9 Score 6 2 - -     Cognitive Function     6CIT Screen 07/15/2017  What Year? 4 points  What month? 0 points  What time? 3 points  Count back from 20 0 points  Months in reverse 0 points  Repeat phrase 8 points  Total Score 15    Immunization History  Administered Date(s) Administered  . Influenza, High Dose Seasonal PF 05/15/2017  . Influenza,inj,Quad PF,6+ Mos 04/08/2014, 05/05/2015  . Influenza-Unspecified 05/01/2016  . Pneumococcal Conjugate-13 01/28/2017    Qualifies for Shingles Vaccine? Yes. Due for Shingles vaccine. Declined my offer to administer today. Education has been provided regarding the importance of this vaccine but still declined. Pt has been advised to call her insurance company to determine her out of pocket expense. Advised she may also receive this vaccine at her local pharmacy or Health Dept. Verbalized acceptance and understanding.  Screening Tests Health Maintenance  Topic Date Due  . MAMMOGRAM  09/24/2016  . TETANUS/TDAP  07/30/2018 (Originally 02/04/1940)  . PNA vac Low Risk Adult (2 of 2 - PPSV23) 01/28/2018  . INFLUENZA  VACCINE  Completed  . DEXA SCAN  Completed    Cancer Screenings: Breast:  Up to date on Mammogram? Yes Breast cancer screenings no longer required  Up to date of Bone Density/Dexa? Yes Osteoporotic screenings no longer required Colorectal: Colon cancer screenings no longer required  Additional Screenings: Hepatitis B/HIV/Syphillis: not required Hepatitis C Screening: not required     Plan:   I have personally reviewed and addressed the Medicare Annual Wellness questionnaire and have noted the following in the patient's chart:  A. Medical and social history B. Use of alcohol, tobacco or illicit drugs  C. Current medications and supplements D. Functional ability and status E.  Nutritional status F.  Physical activity G. Advance directives H. List of other physicians I.  Hospitalizations, surgeries, and ER visits in previous 12 months J.  [place] such as hearing and vision if needed, cognitive and depression L. Referrals and appointments - none  In addition, I have reviewed and discussed with patient certain preventive protocols, quality metrics, and best practice recommendations. A written personalized care plan for preventive services as well as general preventive health recommendations were provided to patient.  See attached scanned questionnaire for additional information.   Signed,  Aleatha Borer, LPN Nurse Health Advisor  MD Recommendations: None

## 2017-07-15 NOTE — Patient Instructions (Signed)
Take mirtazapine full tablet (15 mg) at bedtime.

## 2017-07-16 NOTE — Progress Notes (Signed)
Date:  07/15/2017   Name:  Kimberly Abbott   DOB:  05-11-1921   MRN:  [id number]  PCP:  Adline Potter, MD    Chief Complaint: Chronic Kidney Disease and Insomnia (Not taking whole Mirtazipine only taking 1/2. Forgot to go back up on it after last seen. Sleeping better now than she was.  )   History of Present Illness:  This is a 83 y.o. female seen for one month f/u. Continues to lose weight, 2# since last visit, insomnia off and on, mood ok. No further melena. CKD3 worse last visit, now off asa. UI stable on Ditropan.  Review of Systems:  Review of Systems  Constitutional: Negative for chills and fever.  Respiratory: Negative for cough and shortness of breath.   Cardiovascular: Negative for chest pain and leg swelling.  Genitourinary: Negative for difficulty urinating and dysuria.  Neurological: Negative for syncope and light-headedness.    Patient Active Problem List   Diagnosis Date Noted  . Clostridium difficile colitis 04/30/2017  . Insomnia 04/24/2017  . Problems with swallowing and mastication   . SBO (small bowel obstruction) ([place]) 03/06/2017  . History of esophageal stricture 01/29/2017  . Hiatal hernia 01/29/2017  . Dementia 01/29/2017  . Cricopharyngeal achalasia 01/29/2017  . Vitamin D deficiency 01/29/2017  . Gait instability 01/28/2017  . Gout 01/28/2017  . Unintentional weight loss 01/28/2017  . Primary osteoarthritis of both hands 05/03/2016  . Chronic fatigue 09/01/2015  . Rotator cuff impingement syndrome of left shoulder 09/01/2015  . Pustular psoriasis of palms and soles 05/05/2015  . Chronic kidney disease, stage III (moderate) ([place]) 02/10/2015  . Allergic rhinitis 12/31/2014  . Dermatitis, eczematoid 12/31/2014  . Hyperlipidemia 11/08/2014  . Anemia 11/08/2014  . Recurrent UTI 11/08/2014  . Depression 11/08/2014  . Gastroesophageal reflux disease without esophagitis 11/08/2014  . Mixed urge and stress incontinence 11/08/2014  . At moderate risk  for fall 11/08/2014  . Dysphagia 11/08/2014  . Rotator cuff syndrome of right shoulder 11/08/2014  . Essential hypertension 11/08/2014    Prior to Admission medications   Medication Sig Start Date End Date Taking? Authorizing Provider  acetaminophen (TYLENOL) 500 MG tablet Take 2 tablets (1,000 mg total) 2 (two) times daily as needed by mouth. 06/12/17  Yes Hason Ofarrell, Gwyndolyn Saxon, MD  allopurinol (ZYLOPRIM) 100 MG tablet Take 100 mg by mouth daily.   Yes [provider]  cetaphil (CETAPHIL) cream Apply 2 (two) times daily topically. And after each bath/shower 06/12/17  Yes Shannelle Alguire, Gwyndolyn Saxon, MD  feeding supplement, ENSURE ENLIVE, (ENSURE ENLIVE) LIQD Take 237 mLs by mouth 2 (two) times daily between meals. 06/10/16  Yes Fritzi Mandes, MD  mirtazapine (REMERON) 15 MG tablet Take 1 tablet (15 mg total) at bedtime by mouth. 06/12/17  Yes Gerome Kokesh, Gwyndolyn Saxon, MD  oxybutynin (DITROPAN) 5 MG tablet Take 1 tablet (5 mg total) 2 (two) times daily by mouth. 06/12/17  Yes Tailer Volkert, Gwyndolyn Saxon, MD    Allergies  Allergen Reactions  . Penicillin G Hives  . Penicillins Hives    Has patient had a PCN reaction causing immediate rash, facial/tongue/throat swelling, SOB or lightheadedness with hypotension: No Has patient had a PCN reaction causing severe rash involving mucus membranes or skin necrosis: No Has patient had a PCN reaction that required hospitalization No Has patient had a PCN reaction occurring within the last 10 years: No If all of the above answers are "NO", then may proceed with Cephalosporin use.    Past Surgical History:  Procedure Laterality Date  .  APPENDECTOMY    . ESOPHAGOGASTRODUODENOSCOPY (EGD) WITH PROPOFOL N/A 03/12/2017   Procedure: ESOPHAGOGASTRODUODENOSCOPY (EGD) WITH PROPOFOL;  Surgeon: Lucilla Lame, MD;  Location: [facility] ENDOSCOPY;  Service: Endoscopy;  Laterality: N/A;  . rectal fistula  in the 40's  . TUBAL LIGATION      Social History   Tobacco Use  . Smoking status: Former  Smoker    Packs/day: 0.25    Years: 40.00    Pack years: 10.00    Types: Cigarettes    Last attempt to quit: 1980    Years since quitting: 38.9  . Smokeless tobacco: Never Used  . Tobacco comment: Smoking cessation materials not required  Substance Use Topics  . Alcohol use: No    Alcohol/week: 0.0 oz  . Drug use: No    Family History  Problem Relation Age of Onset  . Diabetes Mother     Medication list has been reviewed and updated.  Physical Examination: BP 116/60   Pulse 78   Temp 97.9 F (36.6 C)   Resp 16   Ht '4\' 9"'  (1.448 m)   Wt 107 lb (48.5 kg)   SpO2 98%   BMI 23.15 kg/m   Physical Exam  Constitutional: She appears well-developed and well-nourished.  Cardiovascular: Normal rate, regular rhythm and normal heart sounds.  Pulmonary/Chest: Effort normal and breath sounds normal.  Musculoskeletal: She exhibits no edema.  Neurological: She is alert.  Skin: Skin is warm and dry.  Psychiatric: She has a normal mood and affect. Her behavior is normal.  Nursing note and vitals reviewed.   Assessment and Plan:  1. Chronic kidney disease, stage III (moderate) (HCC) EGFR 38 last visit, avoiding NSAIDS, consider vit D level next visit  2. Essential hypertension Good control off meds  3. Unintentional weight loss Increase Remeron to 15 mg qhs, may also help insomnia, mood  4. Mixed urge and stress incontinence Cont Ditropan, Myrbetriq ineffective, consider Ditropan XL next refill  5. Chronic gout without tophus, unspecified cause, unspecified site Cont allopurinol  Return in about 2 months (around 09/15/2017).  [name]. [facility]  07/16/2017

## 2017-09-17 ENCOUNTER — Other Ambulatory Visit: Payer: Self-pay

## 2017-09-17 ENCOUNTER — Ambulatory Visit: Payer: Medicare HMO | Admitting: Podiatry

## 2017-09-17 ENCOUNTER — Encounter: Payer: Self-pay | Admitting: Podiatry

## 2017-09-17 DIAGNOSIS — L309 Dermatitis, unspecified: Secondary | ICD-10-CM | POA: Diagnosis not present

## 2017-09-17 DIAGNOSIS — M79676 Pain in unspecified toe(s): Secondary | ICD-10-CM

## 2017-09-17 DIAGNOSIS — L080 Pyoderma: Secondary | ICD-10-CM

## 2017-09-17 DIAGNOSIS — L308 Other specified dermatitis: Secondary | ICD-10-CM

## 2017-09-17 DIAGNOSIS — B351 Tinea unguium: Secondary | ICD-10-CM

## 2017-09-17 MED ORDER — CLOTRIMAZOLE-BETAMETHASONE 1-0.05 % EX CREA: 1.0000 "application " | TOPICAL_CREAM | Freq: Two times a day (BID) | CUTANEOUS | 3 refills | Status: DC

## 2017-09-18 ENCOUNTER — Ambulatory Visit: Payer: Medicare HMO | Admitting: Family Medicine

## 2017-09-18 ENCOUNTER — Encounter: Payer: Self-pay | Admitting: Family Medicine

## 2017-09-18 VITALS — BP 148/80 | HR 72 | Ht <= 58 in | Wt 108.0 lb

## 2017-09-18 DIAGNOSIS — G309 Alzheimer's disease, unspecified: Secondary | ICD-10-CM | POA: Diagnosis not present

## 2017-09-18 DIAGNOSIS — R197 Diarrhea, unspecified: Secondary | ICD-10-CM | POA: Diagnosis not present

## 2017-09-18 DIAGNOSIS — R5382 Chronic fatigue, unspecified: Secondary | ICD-10-CM | POA: Diagnosis not present

## 2017-09-18 DIAGNOSIS — R634 Abnormal weight loss: Secondary | ICD-10-CM

## 2017-09-18 DIAGNOSIS — F0280 Dementia in other diseases classified elsewhere without behavioral disturbance: Secondary | ICD-10-CM | POA: Diagnosis not present

## 2017-09-18 DIAGNOSIS — N183 Chronic kidney disease, stage 3 unspecified: Secondary | ICD-10-CM

## 2017-09-18 DIAGNOSIS — M1A9XX0 Chronic gout, unspecified, without tophus (tophi): Secondary | ICD-10-CM

## 2017-09-18 DIAGNOSIS — N3289 Other specified disorders of bladder: Secondary | ICD-10-CM

## 2017-09-18 DIAGNOSIS — E559 Vitamin D deficiency, unspecified: Secondary | ICD-10-CM | POA: Diagnosis not present

## 2017-09-18 DIAGNOSIS — R10819 Abdominal tenderness, unspecified site: Secondary | ICD-10-CM | POA: Diagnosis not present

## 2017-09-18 DIAGNOSIS — I10 Essential (primary) hypertension: Secondary | ICD-10-CM | POA: Diagnosis not present

## 2017-09-18 DIAGNOSIS — R2681 Unsteadiness on feet: Secondary | ICD-10-CM

## 2017-09-18 LAB — POCT URINALYSIS DIPSTICK
Bilirubin, UA: NEGATIVE
GLUCOSE UA: NEGATIVE
KETONES UA: NEGATIVE
Leukocytes, UA: NEGATIVE
NITRITE UA: NEGATIVE
SPEC GRAV UA: 1.015 (ref 1.010–1.025)
Urobilinogen, UA: 0.2 E.U./dL
pH, UA: 5 (ref 5.0–8.0)

## 2017-09-18 MED ORDER — OXYBUTYNIN CHLORIDE ER 10 MG PO TB24: 10.0000 mg | ORAL_TABLET | Freq: Every day | ORAL | 2 refills | Status: DC

## 2017-09-18 NOTE — Progress Notes (Signed)
Date:  09/18/2017   Name:  Kimberly Abbott   DOB:  1920-10-27   MRN:  [id number]  PCP:  Adline Potter, MD    Chief Complaint: Follow-up (having diarrhea that is causing hemorrhoids. "can't get bowels regulated")   History of Present Illness:  This is a 85 y.o. female seen for two month f/u. C/o diarrhea past 3 weeks, also feels Ditropan not working as well, more urgency. C/o increased fatigue, eGFR 43 last visit. Sleeping well, weight up 1#. Daughter concerned about memory, tongue movements. Using cane, no recent falls.  Review of Systems:  Review of Systems  Constitutional: Negative for chills and fever.  Respiratory: Negative for cough and shortness of breath.   Cardiovascular: Negative for chest pain and leg swelling.  Genitourinary: Negative for dysuria.  Neurological: Negative for syncope and light-headedness.    Patient Active Problem List   Diagnosis Date Noted  . Clostridium difficile colitis 04/30/2017  . Insomnia 04/24/2017  . Problems with swallowing and mastication   . SBO (small bowel obstruction) ([place]) 03/06/2017  . History of esophageal stricture 01/29/2017  . Hiatal hernia 01/29/2017  . Dementia 01/29/2017  . Closed dislocation of tarsometatarsal joint 01/29/2017  . Vitamin D deficiency 01/29/2017  . Gait instability 01/28/2017  . Gout 01/28/2017  . Unintentional weight loss 01/28/2017  . Primary osteoarthritis of both hands 05/03/2016  . Chronic fatigue 09/01/2015  . Rotator cuff impingement syndrome of left shoulder 09/01/2015  . Pustular psoriasis of palms and soles 05/05/2015  . Chronic kidney disease, stage III (moderate) ([place]) 02/10/2015  . Allergic rhinitis 12/31/2014  . Dermatitis, eczematoid 12/31/2014  . Hyperlipidemia 11/08/2014  . Anemia 11/08/2014  . Recurrent UTI 11/08/2014  . Depression 11/08/2014  . Gastroesophageal reflux disease without esophagitis 11/08/2014  . Mixed urge and stress incontinence 11/08/2014  . At moderate risk for fall  11/08/2014  . Dysphagia 11/08/2014  . Rotator cuff syndrome of right shoulder 11/08/2014  . Essential hypertension 11/08/2014    Prior to Admission medications   Medication Sig Start Date End Date Taking? Authorizing Provider  acetaminophen (TYLENOL) 500 MG tablet Take 2 tablets (1,000 mg total) 2 (two) times daily as needed by mouth. 06/12/17  Yes Noni Stonesifer, Gwyndolyn Saxon, MD  allopurinol (ZYLOPRIM) 100 MG tablet Take 100 mg by mouth daily.   Yes [provider]  cetaphil (CETAPHIL) cream Apply 2 (two) times daily topically. And after each bath/shower 06/12/17  Yes Eryk Beavers, Gwyndolyn Saxon, MD  feeding supplement, ENSURE ENLIVE, (ENSURE ENLIVE) LIQD Take 237 mLs by mouth 2 (two) times daily between meals. 06/10/16  Yes Fritzi Mandes, MD  mirtazapine (REMERON) 15 MG tablet Take 1 tablet (15 mg total) at bedtime by mouth. 06/12/17  Yes Ruthann Angulo, Gwyndolyn Saxon, MD  oxybutynin (DITROPAN-XL) 10 MG 24 hr tablet Take 1 tablet (10 mg total) by mouth at bedtime. 09/18/17   Adline Potter, MD    Allergies  Allergen Reactions  . Penicillin G Hives  . Penicillins Hives    Has patient had a PCN reaction causing immediate rash, facial/tongue/throat swelling, SOB or lightheadedness with hypotension: No Has patient had a PCN reaction causing severe rash involving mucus membranes or skin necrosis: No Has patient had a PCN reaction that required hospitalization No Has patient had a PCN reaction occurring within the last 10 years: No If all of the above answers are "NO", then may proceed with Cephalosporin use.    Past Surgical History:  Procedure Laterality Date  . APPENDECTOMY    . ESOPHAGOGASTRODUODENOSCOPY (EGD) WITH PROPOFOL  N/A 03/12/2017   Procedure: ESOPHAGOGASTRODUODENOSCOPY (EGD) WITH PROPOFOL;  Surgeon: Lucilla Lame, MD;  Location: [facility] ENDOSCOPY;  Service: Endoscopy;  Laterality: N/A;  . rectal fistula  in the 40's  . TUBAL LIGATION      Social History   Tobacco Use  . Smoking status: Former Smoker     Packs/day: 0.25    Years: 40.00    Pack years: 10.00    Types: Cigarettes    Last attempt to quit: 1980    Years since quitting: 39.1  . Smokeless tobacco: Never Used  . Tobacco comment: Smoking cessation materials not required  Substance Use Topics  . Alcohol use: No    Alcohol/week: 0.0 oz  . Drug use: No    Family History  Problem Relation Age of Onset  . Diabetes Mother     Medication list has been reviewed and updated.  Physical Examination: BP (!) 148/80   Pulse 72   Ht '4\' 9"'  (1.448 m)   Wt 108 lb (49 kg)   BMI 23.37 kg/m   Physical Exam  Constitutional: She appears well-developed and well-nourished.  Cardiovascular: Normal rate, regular rhythm and normal heart sounds.  Pulmonary/Chest: Effort normal and breath sounds normal.  Abdominal: Soft. She exhibits no distension and no mass.  Mild suprapubic tenderness  Musculoskeletal: She exhibits no edema.  Neurological: She is alert.  Skin: Skin is warm and dry.  Psychiatric: She has a normal mood and affect. Her behavior is normal.  Nursing note and vitals reviewed.   Assessment and Plan:   1. Chronic kidney disease, stage III (moderate) (HCC) Likely age-related, avoid NSAIDS  2. Suprapubic tenderness UA tr bl tr prot only - POCT Urinalysis Dipstick  3. Essential hypertension Adequate control off meds - Comprehensive Metabolic Panel (CMET) - CBC  4. Chronic gout without tophus, unspecified cause, unspecified site Well controlled on allopurinol  5. Chronic fatigue - TSH  6. Gait instability - B12  7. Unintentional weight loss Improved on Remeron  8. Alzheimer's dementia without behavioral disturbance, unspecified timing of dementia onset Stable, Ditropan may be contributing but unwilling to stop  9. Diarrhea, unspecified type Unclear etiology, change to Ditropan Xl may help  10. Bladder instability Change to Ditropan XL 10 mg daily  11. Vitamin D deficiency Off supplement - Vitamin D  (25 hydroxy)  Return in about 4 weeks (around 10/16/2017).  [name]. [facility]  09/18/2017

## 2017-09-19 LAB — VITAMIN B12: Vitamin B-12: 399 pg/mL (ref 232–1245)

## 2017-09-19 LAB — CBC
HEMATOCRIT: 36 % (ref 34.0–46.6)
HEMOGLOBIN: 12.4 g/dL (ref 11.1–15.9)
MCH: 29.6 pg (ref 26.6–33.0)
MCHC: 34.4 g/dL (ref 31.5–35.7)
MCV: 86 fL (ref 79–97)
Platelets: 215 10*3/uL (ref 150–379)
RBC: 4.19 x10E6/uL (ref 3.77–5.28)
RDW: 15.5 % — ABNORMAL HIGH (ref 12.3–15.4)
WBC: 6.2 10*3/uL (ref 3.4–10.8)

## 2017-09-19 LAB — COMPREHENSIVE METABOLIC PANEL
ALBUMIN: 4.5 g/dL (ref 3.2–4.6)
ALT: 14 IU/L (ref 0–32)
AST: 25 IU/L (ref 0–40)
Albumin/Globulin Ratio: 1.5 (ref 1.2–2.2)
Alkaline Phosphatase: 126 IU/L — ABNORMAL HIGH (ref 39–117)
BUN/Creatinine Ratio: 18 (ref 12–28)
BUN: 22 mg/dL (ref 10–36)
Bilirubin Total: 0.4 mg/dL (ref 0.0–1.2)
CO2: 24 mmol/L (ref 20–29)
Calcium: 9.9 mg/dL (ref 8.7–10.3)
Chloride: 105 mmol/L (ref 96–106)
Creatinine, Ser: 1.24 mg/dL — ABNORMAL HIGH (ref 0.57–1.00)
GFR, EST AFRICAN AMERICAN: 43 mL/min/{1.73_m2} — AB (ref >59)
GFR, EST NON AFRICAN AMERICAN: 37 mL/min/{1.73_m2} — AB (ref >59)
GLUCOSE: 83 mg/dL (ref 65–99)
Globulin, Total: 3 g/dL (ref 1.5–4.5)
Potassium: 4.3 mmol/L (ref 3.5–5.2)
Sodium: 143 mmol/L (ref 134–144)
TOTAL PROTEIN: 7.5 g/dL (ref 6.0–8.5)

## 2017-09-19 LAB — VITAMIN D 25 HYDROXY (VIT D DEFICIENCY, FRACTURES): Vit D, 25-Hydroxy: 42.8 ng/mL (ref 30.0–100.0)

## 2017-09-19 LAB — TSH: TSH: 0.906 u[IU]/mL (ref 0.450–4.500)

## 2017-09-19 NOTE — Progress Notes (Signed)
   SUBJECTIVE Patient presents to office today complaining of elongated, thickened nails. Pain while ambulating in shoes. Patient is unable to trim their own nails.   Past Medical History:  Diagnosis Date  . Allergy   . CKD (chronic kidney disease)   . GERD (gastroesophageal reflux disease)   . Hyperlipidemia   . Hypertension     OBJECTIVE General Patient is awake, alert, and oriented x 3 and in no acute distress. Derm Diffuse pustular lesions noted to the right plantar foot. Skin is dry and supple bilateral. Negative open lesions or macerations. Remaining integument unremarkable. Nails are tender, long, thickened and dystrophic with subungual debris, consistent with onychomycosis, 1-5 bilateral. No signs of infection noted. Vasc  DP and PT pedal pulses palpable bilaterally. Temperature gradient within normal limits.  Neuro Epicritic and protective threshold sensation diminished bilaterally.  Musculoskeletal Exam No symptomatic pedal deformities noted bilateral. Muscular strength within normal limits.  ASSESSMENT 1. Onychodystrophic nails 1-5 bilateral with hyperkeratosis of nails.  2. Onychomycosis of nail due to dermatophyte bilateral 3. Pain in foot bilateral 4. Pustular lesions right plantar foot  PLAN OF CARE 1. Patient evaluated today.  2. Instructed to maintain good pedal hygiene and foot care.  3. Mechanical debridement of nails 1-5 bilaterally performed using a nail nipper. Filed with dremel without incident.  4. Culture taken of purulent drainage from pustular lesion of right foot.  5. Our office will call patient with results. 6. Refill prescription for Lotrisone cream provided to patient.  7. Return to clinic in 3 months.   Edrick Kins, DPM [facility]  Dr. Edrick Kins, [place]                                        [place], [place] [address]                Office [phone]  Fax [phone]

## 2017-10-18 ENCOUNTER — Ambulatory Visit: Payer: Medicare HMO | Admitting: Family Medicine

## 2017-10-23 ENCOUNTER — Ambulatory Visit: Payer: Medicare HMO | Admitting: Family Medicine

## 2017-10-23 ENCOUNTER — Encounter: Payer: Self-pay | Admitting: Family Medicine

## 2017-10-23 VITALS — BP 128/82 | HR 78 | Resp 16 | Ht <= 58 in | Wt 108.8 lb

## 2017-10-23 DIAGNOSIS — M1A9XX0 Chronic gout, unspecified, without tophus (tophi): Secondary | ICD-10-CM

## 2017-10-23 DIAGNOSIS — N3289 Other specified disorders of bladder: Secondary | ICD-10-CM | POA: Diagnosis not present

## 2017-10-23 DIAGNOSIS — N183 Chronic kidney disease, stage 3 unspecified: Secondary | ICD-10-CM

## 2017-10-23 DIAGNOSIS — R5382 Chronic fatigue, unspecified: Secondary | ICD-10-CM | POA: Diagnosis not present

## 2017-10-23 DIAGNOSIS — J309 Allergic rhinitis, unspecified: Secondary | ICD-10-CM

## 2017-10-23 DIAGNOSIS — R634 Abnormal weight loss: Secondary | ICD-10-CM

## 2017-10-23 MED ORDER — ALLOPURINOL 100 MG PO TABS: 100.0000 mg | ORAL_TABLET | Freq: Every day | ORAL | 3 refills | Status: AC

## 2017-10-23 MED ORDER — MIRTAZAPINE 15 MG PO TABS: 15.0000 mg | ORAL_TABLET | Freq: Every day | ORAL | 3 refills | Status: AC

## 2017-10-23 MED ORDER — CETIRIZINE HCL 5 MG PO TABS: 5.0000 mg | ORAL_TABLET | Freq: Every day | ORAL | 3 refills | Status: AC

## 2017-10-23 NOTE — Progress Notes (Signed)
Date:  10/23/2017   Name:  Kimberly Abbott   DOB:  September 02, 1920   MRN:  [id number]  PCP:  Adline Potter, MD    Chief Complaint: Chronic Kidney Disease (needs refills ) and Allergies (wants to start cetirizine 10 mg - has taken before does not cause sleep spells )   History of Present Illness:  This is a 84 y.o. female seen for one month f/u. Still c/o fatigue but no worse. Ditropan Xl cause diarrhea, went back to Ditropan bid. Wants to begin Zyrtec for allergies and needs refills on Remeron and allopurinol.   Review of Systems:  Review of Systems  Constitutional: Negative for chills and fever.  Respiratory: Negative for cough and shortness of breath.   Cardiovascular: Negative for chest pain and leg swelling.  Genitourinary: Negative for difficulty urinating.  Neurological: Negative for syncope and light-headedness.    Patient Active Problem List   Diagnosis Date Noted  . Clostridium difficile colitis 04/30/2017  . Insomnia 04/24/2017  . Problems with swallowing and mastication   . SBO (small bowel obstruction) ([place]) 03/06/2017  . History of esophageal stricture 01/29/2017  . Hiatal hernia 01/29/2017  . Dementia 01/29/2017  . Closed dislocation of tarsometatarsal joint 01/29/2017  . Vitamin D deficiency 01/29/2017  . Gait instability 01/28/2017  . Gout 01/28/2017  . Unintentional weight loss 01/28/2017  . Primary osteoarthritis of both hands 05/03/2016  . Chronic fatigue 09/01/2015  . Rotator cuff impingement syndrome of left shoulder 09/01/2015  . Pustular psoriasis of palms and soles 05/05/2015  . Chronic kidney disease, stage III (moderate) ([place]) 02/10/2015  . Allergic rhinitis 12/31/2014  . Dermatitis, eczematoid 12/31/2014  . Hyperlipidemia 11/08/2014  . Anemia 11/08/2014  . Recurrent UTI 11/08/2014  . Depression 11/08/2014  . Gastroesophageal reflux disease without esophagitis 11/08/2014  . Bladder instability 11/08/2014  . At moderate risk for fall 11/08/2014  .  Dysphagia 11/08/2014  . Rotator cuff syndrome of right shoulder 11/08/2014  . Essential hypertension 11/08/2014    Prior to Admission medications   Medication Sig Start Date End Date Taking? Authorizing Provider  acetaminophen (TYLENOL) 500 MG tablet Take 2 tablets (1,000 mg total) 2 (two) times daily as needed by mouth. 06/12/17  Yes Graciana Sessa, Gwyndolyn Saxon, MD  allopurinol (ZYLOPRIM) 100 MG tablet Take 1 tablet (100 mg total) by mouth daily. 10/23/17  Yes Jilene Spohr, Gwyndolyn Saxon, MD  feeding supplement, ENSURE ENLIVE, (ENSURE ENLIVE) LIQD Take 237 mLs by mouth 2 (two) times daily between meals. 06/10/16  Yes Fritzi Mandes, MD  mirtazapine (REMERON) 15 MG tablet Take 1 tablet (15 mg total) by mouth at bedtime. 10/23/17  Yes Blenda Wisecup, Gwyndolyn Saxon, MD  oxybutynin (DITROPAN) 5 MG tablet Take 5 mg by mouth 2 (two) times daily.   Yes [provider]  cetirizine (ZYRTEC) 5 MG tablet Take 1 tablet (5 mg total) by mouth daily. 10/23/17   Adline Potter, MD    Allergies  Allergen Reactions  . Penicillin G Hives  . Penicillins Hives    Has patient had a PCN reaction causing immediate rash, facial/tongue/throat swelling, SOB or lightheadedness with hypotension: No Has patient had a PCN reaction causing severe rash involving mucus membranes or skin necrosis: No Has patient had a PCN reaction that required hospitalization No Has patient had a PCN reaction occurring within the last 10 years: No If all of the above answers are "NO", then may proceed with Cephalosporin use.    Past Surgical History:  Procedure Laterality Date  . APPENDECTOMY    .  ESOPHAGOGASTRODUODENOSCOPY (EGD) WITH PROPOFOL N/A 03/12/2017   Procedure: ESOPHAGOGASTRODUODENOSCOPY (EGD) WITH PROPOFOL;  Surgeon: Lucilla Lame, MD;  Location[facility]COPY;  Service: Endoscopy;  Laterality: N/A;  . rectal fistula  in the 40's  . TUBAL LIGATION      Social History   Tobacco Use  . Smoking status: Former Smoker    Packs/day: 0.25    Years: 40.00     Pack years: 10.00    Types: Cigarettes    Last attempt to quit: 1980    Years since quitting: 39.2  . Smokeless tobacco: Never Used  . Tobacco comment: Smoking cessation materials not required  Substance Use Topics  . Alcohol use: No    Alcohol/week: 0.0 oz  . Drug use: No    Family History  Problem Relation Age of Onset  . Diabetes Mother     Medication list has been reviewed and updated.  Physical Examination: BP 128/82   Pulse 78   Resp 16   Ht _0  (1.448 m)   Wt 108 lb 12.8 oz (49.4 kg)   SpO2 98%   BMI 23.54 kg/m   Physical Exam  Constitutional: She appears well-developed and well-nourished.  Cardiovascular: Normal rate, regular rhythm and normal heart sounds.  Pulmonary/Chest: Effort normal and breath sounds normal.  Musculoskeletal: She exhibits no edema.  Neurological: She is alert.  Skin: Skin is warm and dry.  Psychiatric: She has a normal mood and affect. Her behavior is normal.  Nursing note and vitals reviewed.   Assessment and Plan:  1. Chronic kidney disease, stage III (moderate) (HCC) Stable, last eGFR 43, likely age related, avoiding NSAIDS  2. Allergic rhinitis, unspecified seasonality, unspecified trigger Begin Zyrtec 5 mg daily  3. Bladder instability Intolerant Myrbetriq/Ditropan XL, resume Ditropan bid, may be contributing to fatigue/memory issues but unwilling to stop  4. Unintentional weight loss Weight stable on Remeron, refill  5. Chronic gout without tophus, unspecified cause, unspecified site Well controlled on allopurinol, refill   6. Chronic fatigue Labs ok, no clear reversible cause, continue to monitor  Return in about 6 months (around 04/25/2018).  [name]. [facility]  10/23/2017

## 2017-11-11 DIAGNOSIS — L298 Other pruritus: Secondary | ICD-10-CM | POA: Diagnosis not present

## 2017-11-11 DIAGNOSIS — R233 Spontaneous ecchymoses: Secondary | ICD-10-CM | POA: Diagnosis not present

## 2017-11-11 DIAGNOSIS — L820 Inflamed seborrheic keratosis: Secondary | ICD-10-CM | POA: Diagnosis not present

## 2017-12-09 DIAGNOSIS — M1A9XX0 Chronic gout, unspecified, without tophus (tophi): Secondary | ICD-10-CM | POA: Diagnosis not present

## 2017-12-09 DIAGNOSIS — N184 Chronic kidney disease, stage 4 (severe): Secondary | ICD-10-CM | POA: Diagnosis not present

## 2017-12-09 DIAGNOSIS — Z8719 Personal history of other diseases of the digestive system: Secondary | ICD-10-CM | POA: Diagnosis not present

## 2017-12-09 DIAGNOSIS — Z90710 Acquired absence of both cervix and uterus: Secondary | ICD-10-CM | POA: Diagnosis not present

## 2017-12-09 DIAGNOSIS — Z6821 Body mass index (BMI) 21.0-21.9, adult: Secondary | ICD-10-CM | POA: Diagnosis not present

## 2017-12-09 DIAGNOSIS — Z66 Do not resuscitate: Secondary | ICD-10-CM | POA: Diagnosis not present

## 2017-12-09 DIAGNOSIS — R630 Anorexia: Secondary | ICD-10-CM | POA: Diagnosis not present

## 2017-12-09 DIAGNOSIS — Z9089 Acquired absence of other organs: Secondary | ICD-10-CM | POA: Diagnosis not present

## 2017-12-09 DIAGNOSIS — Z87891 Personal history of nicotine dependence: Secondary | ICD-10-CM | POA: Diagnosis not present

## 2017-12-17 ENCOUNTER — Ambulatory Visit: Payer: Medicare HMO | Admitting: Podiatry

## 2017-12-20 ENCOUNTER — Ambulatory Visit: Payer: Medicare HMO | Admitting: Podiatry

## 2017-12-24 ENCOUNTER — Encounter: Payer: Self-pay | Admitting: Podiatry

## 2017-12-24 ENCOUNTER — Ambulatory Visit: Payer: Medicare HMO | Admitting: Podiatry

## 2017-12-24 DIAGNOSIS — M79676 Pain in unspecified toe(s): Secondary | ICD-10-CM | POA: Diagnosis not present

## 2017-12-24 DIAGNOSIS — B351 Tinea unguium: Secondary | ICD-10-CM

## 2017-12-25 NOTE — Progress Notes (Signed)
   SUBJECTIVE Patient presents to office today complaining of elongated, thickened nails that cause pain while ambulating in shoes. She is unable to trim her own nails. Patient is here for further evaluation and treatment.  Past Medical History:  Diagnosis Date  . Allergy   . CKD (chronic kidney disease)   . GERD (gastroesophageal reflux disease)   . Hyperlipidemia   . Hypertension     OBJECTIVE General Patient is awake, alert, and oriented x 3 and in no acute distress. Derm Skin is dry and supple bilateral. Negative open lesions or macerations. Remaining integument unremarkable. Nails are tender, long, thickened and dystrophic with subungual debris, consistent with onychomycosis, 1-5 bilateral. No signs of infection noted. Vasc  DP and PT pedal pulses palpable bilaterally. Temperature gradient within normal limits.  Neuro Epicritic and protective threshold sensation grossly intact bilaterally.  Musculoskeletal Exam No symptomatic pedal deformities noted bilateral. Muscular strength within normal limits.  ASSESSMENT 1. Onychodystrophic nails 1-5 bilateral with hyperkeratosis of nails.  2. Onychomycosis of nail due to dermatophyte bilateral 3. Pain in foot bilateral  PLAN OF CARE 1. Patient evaluated today.  2. Instructed to maintain good pedal hygiene and foot care.  3. Mechanical debridement of nails 1-5 bilaterally performed using a nail nipper. Filed with dremel without incident.  4. Return to clinic in 3 mos.    Josceline Chenard M. Mykell Rawl, DPM [facility]  Dr. Reshonda Koerber M. Tyianna Menefee, DPM    [address]                                        [place], [place] [address]                Office [phone]  Fax [phone]     

## 2018-01-20 DIAGNOSIS — Z8719 Personal history of other diseases of the digestive system: Secondary | ICD-10-CM | POA: Diagnosis not present

## 2018-01-20 DIAGNOSIS — Z1389 Encounter for screening for other disorder: Secondary | ICD-10-CM | POA: Diagnosis not present

## 2018-01-20 DIAGNOSIS — R69 Illness, unspecified: Secondary | ICD-10-CM | POA: Diagnosis not present

## 2018-01-20 DIAGNOSIS — F411 Generalized anxiety disorder: Secondary | ICD-10-CM | POA: Diagnosis not present

## 2018-01-20 DIAGNOSIS — N184 Chronic kidney disease, stage 4 (severe): Secondary | ICD-10-CM | POA: Diagnosis not present

## 2018-01-20 DIAGNOSIS — M79671 Pain in right foot: Secondary | ICD-10-CM | POA: Diagnosis not present

## 2018-01-20 DIAGNOSIS — E782 Mixed hyperlipidemia: Secondary | ICD-10-CM | POA: Diagnosis not present

## 2018-01-20 DIAGNOSIS — R350 Frequency of micturition: Secondary | ICD-10-CM | POA: Diagnosis not present

## 2018-01-20 DIAGNOSIS — Z9181 History of falling: Secondary | ICD-10-CM | POA: Diagnosis not present

## 2018-01-20 DIAGNOSIS — F321 Major depressive disorder, single episode, moderate: Secondary | ICD-10-CM | POA: Diagnosis not present

## 2018-01-20 DIAGNOSIS — R197 Diarrhea, unspecified: Secondary | ICD-10-CM | POA: Diagnosis not present

## 2018-02-17 DIAGNOSIS — R69 Illness, unspecified: Secondary | ICD-10-CM | POA: Diagnosis not present

## 2018-02-17 DIAGNOSIS — I10 Essential (primary) hypertension: Secondary | ICD-10-CM | POA: Diagnosis not present

## 2018-02-17 DIAGNOSIS — M109 Gout, unspecified: Secondary | ICD-10-CM | POA: Diagnosis not present

## 2018-02-17 DIAGNOSIS — Z9181 History of falling: Secondary | ICD-10-CM | POA: Diagnosis not present

## 2018-02-17 DIAGNOSIS — Z1389 Encounter for screening for other disorder: Secondary | ICD-10-CM | POA: Diagnosis not present

## 2018-02-17 DIAGNOSIS — Z0000 Encounter for general adult medical examination without abnormal findings: Secondary | ICD-10-CM | POA: Diagnosis not present

## 2018-02-17 DIAGNOSIS — N3281 Overactive bladder: Secondary | ICD-10-CM | POA: Diagnosis not present

## 2018-02-17 DIAGNOSIS — N184 Chronic kidney disease, stage 4 (severe): Secondary | ICD-10-CM | POA: Diagnosis not present

## 2018-02-17 DIAGNOSIS — F411 Generalized anxiety disorder: Secondary | ICD-10-CM | POA: Diagnosis not present

## 2018-02-17 DIAGNOSIS — J309 Allergic rhinitis, unspecified: Secondary | ICD-10-CM | POA: Diagnosis not present

## 2018-03-17 DIAGNOSIS — R69 Illness, unspecified: Secondary | ICD-10-CM | POA: Diagnosis not present

## 2018-03-17 DIAGNOSIS — I10 Essential (primary) hypertension: Secondary | ICD-10-CM | POA: Diagnosis not present

## 2018-03-21 DIAGNOSIS — H02831 Dermatochalasis of right upper eyelid: Secondary | ICD-10-CM | POA: Diagnosis not present

## 2018-03-24 DIAGNOSIS — L403 Pustulosis palmaris et plantaris: Secondary | ICD-10-CM | POA: Diagnosis not present

## 2018-03-24 DIAGNOSIS — L820 Inflamed seborrheic keratosis: Secondary | ICD-10-CM | POA: Diagnosis not present

## 2018-03-24 DIAGNOSIS — L811 Chloasma: Secondary | ICD-10-CM | POA: Diagnosis not present

## 2018-03-28 ENCOUNTER — Ambulatory Visit: Payer: Medicare HMO | Admitting: Podiatry

## 2018-03-28 ENCOUNTER — Encounter: Payer: Self-pay | Admitting: Podiatry

## 2018-03-28 DIAGNOSIS — M79676 Pain in unspecified toe(s): Secondary | ICD-10-CM | POA: Diagnosis not present

## 2018-03-28 DIAGNOSIS — B351 Tinea unguium: Secondary | ICD-10-CM

## 2018-03-28 MED ORDER — CLOBETASOL PROPIONATE 0.05 % EX OINT: 1.0000 "application " | TOPICAL_OINTMENT | Freq: Two times a day (BID) | CUTANEOUS | 2 refills | Status: DC

## 2018-03-29 NOTE — Progress Notes (Signed)
   SUBJECTIVE Patient presents to office today complaining of elongated, thickened nails that cause pain while ambulating in shoes. She is unable to trim her own nails. Patient is here for further evaluation and treatment.  Past Medical History:  Diagnosis Date  . Allergy   . CKD (chronic kidney disease)   . GERD (gastroesophageal reflux disease)   . Hyperlipidemia   . Hypertension     OBJECTIVE General Patient is awake, alert, and oriented x 3 and in no acute distress. Derm Skin is dry and supple bilateral. Negative open lesions or macerations. Remaining integument unremarkable. Nails are tender, long, thickened and dystrophic with subungual debris, consistent with onychomycosis, 1-5 bilateral. No signs of infection noted. Vasc  DP and PT pedal pulses palpable bilaterally. Temperature gradient within normal limits.  Neuro Epicritic and protective threshold sensation grossly intact bilaterally.  Musculoskeletal Exam No symptomatic pedal deformities noted bilateral. Muscular strength within normal limits.  ASSESSMENT 1. Onychodystrophic nails 1-5 bilateral with hyperkeratosis of nails.  2. Onychomycosis of nail due to dermatophyte bilateral 3. Pain in foot bilateral  PLAN OF CARE 1. Patient evaluated today.  2. Instructed to maintain good pedal hygiene and foot care.  3. Mechanical debridement of nails 1-5 bilaterally performed using a nail nipper. Filed with dremel without incident.  4. Return to clinic in 3 mos.    Edrick Kins, DPM [facility]  Dr. Edrick Kins, [address]                                        [place], [place] [address]                Office [phone]  Fax [phone]

## 2018-04-01 DIAGNOSIS — R5383 Other fatigue: Secondary | ICD-10-CM | POA: Diagnosis not present

## 2018-04-01 DIAGNOSIS — R011 Cardiac murmur, unspecified: Secondary | ICD-10-CM | POA: Diagnosis not present

## 2018-04-01 DIAGNOSIS — R5382 Chronic fatigue, unspecified: Secondary | ICD-10-CM | POA: Diagnosis not present

## 2018-04-01 DIAGNOSIS — M19041 Primary osteoarthritis, right hand: Secondary | ICD-10-CM | POA: Diagnosis not present

## 2018-04-01 DIAGNOSIS — J329 Chronic sinusitis, unspecified: Secondary | ICD-10-CM | POA: Diagnosis not present

## 2018-04-01 DIAGNOSIS — I10 Essential (primary) hypertension: Secondary | ICD-10-CM | POA: Diagnosis not present

## 2018-04-01 DIAGNOSIS — N184 Chronic kidney disease, stage 4 (severe): Secondary | ICD-10-CM | POA: Diagnosis not present

## 2018-04-01 DIAGNOSIS — D696 Thrombocytopenia, unspecified: Secondary | ICD-10-CM | POA: Diagnosis not present

## 2018-04-01 DIAGNOSIS — R0602 Shortness of breath: Secondary | ICD-10-CM | POA: Diagnosis not present

## 2018-04-01 DIAGNOSIS — R001 Bradycardia, unspecified: Secondary | ICD-10-CM | POA: Diagnosis not present

## 2018-04-07 DIAGNOSIS — I10 Essential (primary) hypertension: Secondary | ICD-10-CM | POA: Diagnosis not present

## 2018-04-07 DIAGNOSIS — M109 Gout, unspecified: Secondary | ICD-10-CM | POA: Diagnosis not present

## 2018-04-07 DIAGNOSIS — R69 Illness, unspecified: Secondary | ICD-10-CM | POA: Diagnosis not present

## 2018-04-16 DIAGNOSIS — R0602 Shortness of breath: Secondary | ICD-10-CM | POA: Diagnosis not present

## 2018-04-16 DIAGNOSIS — R0609 Other forms of dyspnea: Secondary | ICD-10-CM | POA: Diagnosis not present

## 2018-05-06 DIAGNOSIS — R69 Illness, unspecified: Secondary | ICD-10-CM | POA: Diagnosis not present

## 2018-05-06 DIAGNOSIS — I10 Essential (primary) hypertension: Secondary | ICD-10-CM | POA: Diagnosis not present

## 2018-05-12 DIAGNOSIS — R69 Illness, unspecified: Secondary | ICD-10-CM | POA: Diagnosis not present

## 2018-06-19 DIAGNOSIS — R69 Illness, unspecified: Secondary | ICD-10-CM | POA: Diagnosis not present

## 2018-06-19 DIAGNOSIS — F321 Major depressive disorder, single episode, moderate: Secondary | ICD-10-CM | POA: Diagnosis not present

## 2018-06-20 ENCOUNTER — Encounter: Payer: Self-pay | Admitting: Podiatry

## 2018-06-20 ENCOUNTER — Ambulatory Visit: Payer: Medicare HMO | Admitting: Podiatry

## 2018-06-20 DIAGNOSIS — M79676 Pain in unspecified toe(s): Secondary | ICD-10-CM

## 2018-06-20 DIAGNOSIS — L308 Other specified dermatitis: Secondary | ICD-10-CM

## 2018-06-20 DIAGNOSIS — B351 Tinea unguium: Secondary | ICD-10-CM | POA: Diagnosis not present

## 2018-06-20 DIAGNOSIS — L080 Pyoderma: Secondary | ICD-10-CM

## 2018-06-20 MED ORDER — BETAMETHASONE DIPROPIONATE 0.05 % EX CREA: TOPICAL_CREAM | Freq: Two times a day (BID) | CUTANEOUS | 1 refills | Status: AC

## 2018-06-23 DIAGNOSIS — M109 Gout, unspecified: Secondary | ICD-10-CM | POA: Diagnosis not present

## 2018-06-23 DIAGNOSIS — E782 Mixed hyperlipidemia: Secondary | ICD-10-CM | POA: Diagnosis not present

## 2018-06-23 DIAGNOSIS — N390 Urinary tract infection, site not specified: Secondary | ICD-10-CM | POA: Diagnosis not present

## 2018-06-23 DIAGNOSIS — R69 Illness, unspecified: Secondary | ICD-10-CM | POA: Diagnosis not present

## 2018-06-23 DIAGNOSIS — I10 Essential (primary) hypertension: Secondary | ICD-10-CM | POA: Diagnosis not present

## 2018-06-23 DIAGNOSIS — Z1389 Encounter for screening for other disorder: Secondary | ICD-10-CM | POA: Diagnosis not present

## 2018-06-23 DIAGNOSIS — J309 Allergic rhinitis, unspecified: Secondary | ICD-10-CM | POA: Diagnosis not present

## 2018-06-23 DIAGNOSIS — N184 Chronic kidney disease, stage 4 (severe): Secondary | ICD-10-CM | POA: Diagnosis not present

## 2018-06-23 NOTE — Progress Notes (Signed)
   SUBJECTIVE Patient presents to office today complaining of elongated, thickened nails that cause pain while ambulating in shoes. She is unable to trim her own nails. Patient is here for further evaluation and treatment.  Past Medical History:  Diagnosis Date  . Allergy   . CKD (chronic kidney disease)   . GERD (gastroesophageal reflux disease)   . Hyperlipidemia   . Hypertension     OBJECTIVE General Patient is awake, alert, and oriented x 3 and in no acute distress. Derm Pinpoint vesicular lesions noted to the right plantar forefoot. Skin is dry and supple bilateral. Negative open lesions or macerations. Remaining integument unremarkable. Nails are tender, long, thickened and dystrophic with subungual debris, consistent with onychomycosis, 1-5 bilateral. No signs of infection noted. Vasc  DP and PT pedal pulses palpable bilaterally. Temperature gradient within normal limits.  Neuro Epicritic and protective threshold sensation grossly intact bilaterally.  Musculoskeletal Exam No symptomatic pedal deformities noted bilateral. Muscular strength within normal limits.  ASSESSMENT 1. Onychodystrophic nails 1-5 bilateral with hyperkeratosis of nails.  2. Onychomycosis of nail due to dermatophyte bilateral 3. Vesicular dermatitis right foot   PLAN OF CARE 1. Patient evaluated today.  2. Instructed to maintain good pedal hygiene and foot care.  3. Mechanical debridement of nails 1-5 bilaterally performed using a nail nipper. Filed with dremel without incident.  4. Refill prescription for Temovate cream provided to patient.  5. Return to clinic in 3 mos.    Edrick Kins, DPM [facility]  Dr. Edrick Kins, [address]                                        [place], [place] [address]                Office [phone]  Fax [phone]

## 2018-06-24 DIAGNOSIS — N390 Urinary tract infection, site not specified: Secondary | ICD-10-CM | POA: Diagnosis not present

## 2018-07-09 DIAGNOSIS — K644 Residual hemorrhoidal skin tags: Secondary | ICD-10-CM | POA: Diagnosis not present

## 2018-07-09 DIAGNOSIS — E559 Vitamin D deficiency, unspecified: Secondary | ICD-10-CM | POA: Diagnosis not present

## 2018-07-09 DIAGNOSIS — R350 Frequency of micturition: Secondary | ICD-10-CM | POA: Diagnosis not present

## 2018-07-09 DIAGNOSIS — K629 Disease of anus and rectum, unspecified: Secondary | ICD-10-CM | POA: Diagnosis not present

## 2018-07-09 DIAGNOSIS — R5383 Other fatigue: Secondary | ICD-10-CM | POA: Diagnosis not present

## 2018-07-09 DIAGNOSIS — R69 Illness, unspecified: Secondary | ICD-10-CM | POA: Diagnosis not present

## 2018-07-09 DIAGNOSIS — I10 Essential (primary) hypertension: Secondary | ICD-10-CM | POA: Diagnosis not present

## 2018-07-09 DIAGNOSIS — Z1389 Encounter for screening for other disorder: Secondary | ICD-10-CM | POA: Diagnosis not present

## 2018-07-16 ENCOUNTER — Ambulatory Visit: Payer: Self-pay

## 2018-08-05 ENCOUNTER — Ambulatory Visit: Payer: Medicare HMO | Admitting: Podiatry

## 2018-09-02 ENCOUNTER — Encounter: Payer: Medicare HMO | Admitting: Podiatry

## 2018-09-07 NOTE — Progress Notes (Signed)
This encounter was created in error - please disregard.

## 2018-09-11 ENCOUNTER — Ambulatory Visit: Payer: Medicare HMO | Admitting: Podiatry

## 2018-09-11 ENCOUNTER — Encounter: Payer: Self-pay | Admitting: Podiatry

## 2018-09-11 DIAGNOSIS — L080 Pyoderma: Secondary | ICD-10-CM | POA: Diagnosis not present

## 2018-09-11 DIAGNOSIS — B353 Tinea pedis: Secondary | ICD-10-CM

## 2018-09-11 DIAGNOSIS — L308 Other specified dermatitis: Secondary | ICD-10-CM

## 2018-09-11 DIAGNOSIS — M79676 Pain in unspecified toe(s): Secondary | ICD-10-CM

## 2018-09-11 DIAGNOSIS — B351 Tinea unguium: Secondary | ICD-10-CM

## 2018-09-11 MED ORDER — CLOTRIMAZOLE-BETAMETHASONE 1-0.05 % EX CREA: 1.0000 "application " | TOPICAL_CREAM | Freq: Two times a day (BID) | CUTANEOUS | 1 refills | Status: DC

## 2018-09-11 NOTE — Progress Notes (Signed)
Complaint:  Visit Type: Patient returns to my office for continued preventative foot care services. Complaint: Patient states" my nails have grown long and thick and become painful to walk and wear shoes" Patient has been diagnosed with DM with no foot complications. The patient presents for preventative foot care services. No changes to ROS.  She also says she has skin rash on the arch of her right foot.  She says this is extremely itchy. She was previously treated with steroid cream.  Podiatric Exam: Vascular: dorsalis pedis and posterior tibial pulses are palpable bilateral. Capillary return is immediate. Temperature gradient is WNL. Skin turgor WNL  Sensorium: Normal Semmes Weinstein monofilament test. Normal tactile sensation bilaterally. Nail Exam: Pt has thick disfigured discolored nails with subungual debris noted bilateral entire nail hallux through fifth toenails Ulcer Exam: There is no evidence of ulcer or pre-ulcerative changes or infection. Orthopedic Exam: Muscle tone and strength are WNL. No limitations in general ROM. No crepitus or effusions noted. Foot type and digits show no abnormalities. Bony prominences are unremarkable. Skin: No Porokeratosis. No infection or ulcers.  Multiple vessicular lesions right foot arch.  Diagnosis:  Onychomycosis, , Pain in right toe, pain in left toes Tinea pedis   Treatment & Plan Procedures and Treatment: Consent by patient was obtained for treatment procedures.   Debridement of mycotic and hypertrophic toenails, 1 through 5 bilateral and clearing of subungual debris. No ulceration, no infection noted. Prescribe lotrisone cream for home usage. Return Visit-Office Procedure: Patient instructed to return to the office for a follow up visit 3 months for continued evaluation and treatment.    Gardiner Barefoot DPM

## 2018-10-03 IMAGING — US US ABDOMEN LIMITED
1 series · 14 of 25 positions shown · non-contrast
Comparison: None.

CLINICAL DATA: Right upper quadrant pain.

EXAM:
ULTRASOUND ABDOMEN LIMITED RIGHT UPPER QUADRANT

[Series 1: us abdomen limited · 0.15mm/px · 14 of 47 slices shown]
[im 1/47]
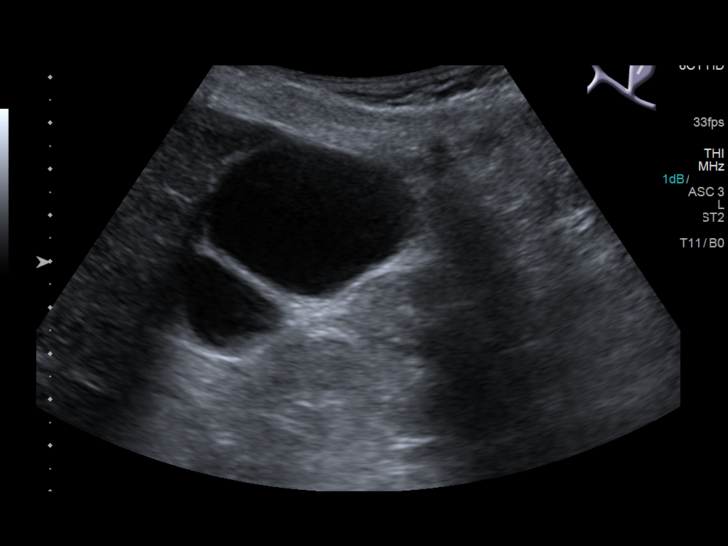
[im 4/47]
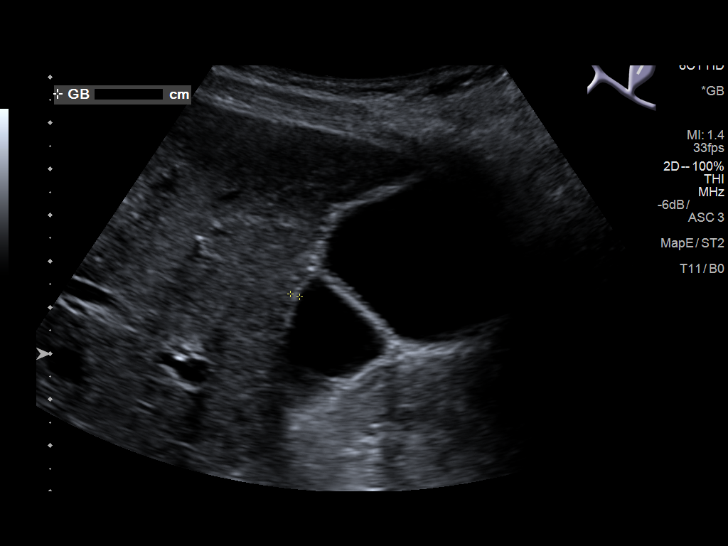
[im 8/47]
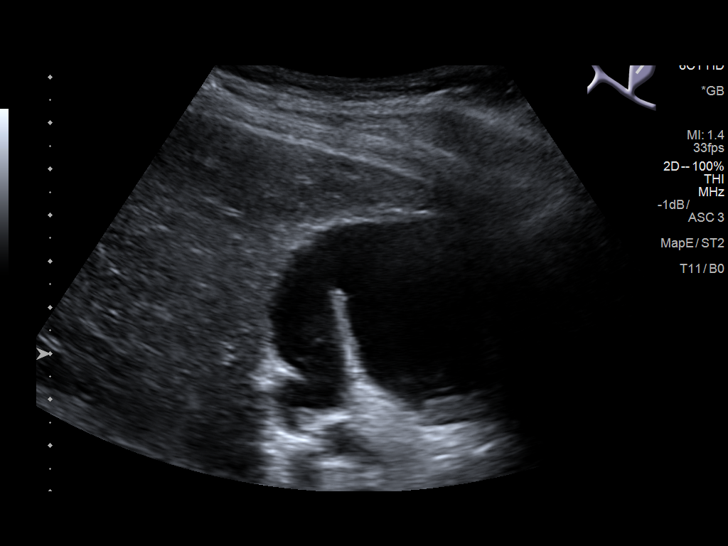
[im 12/47]
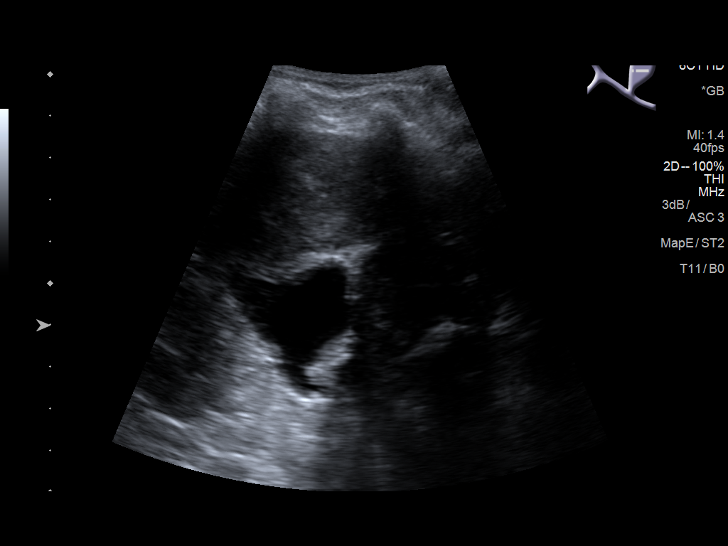
[im 16/47]
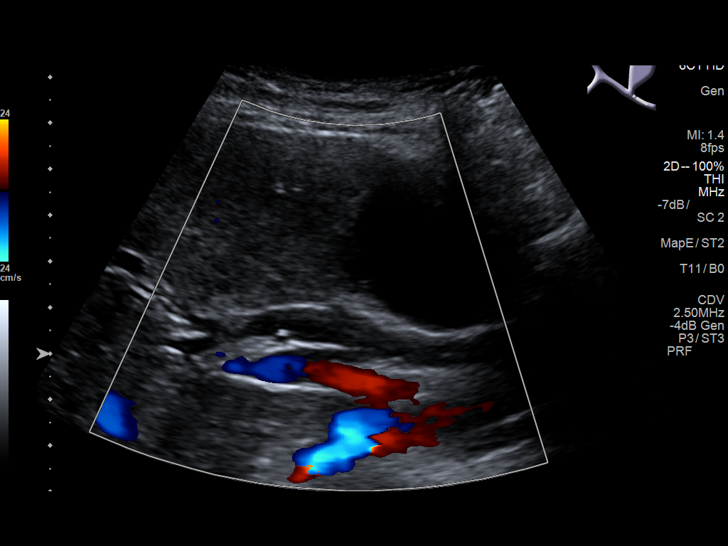
[im 18/47]
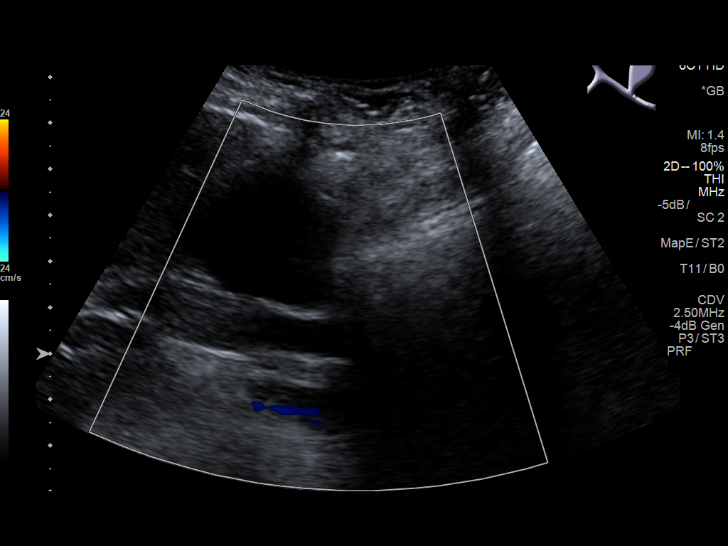
[im 22/47]
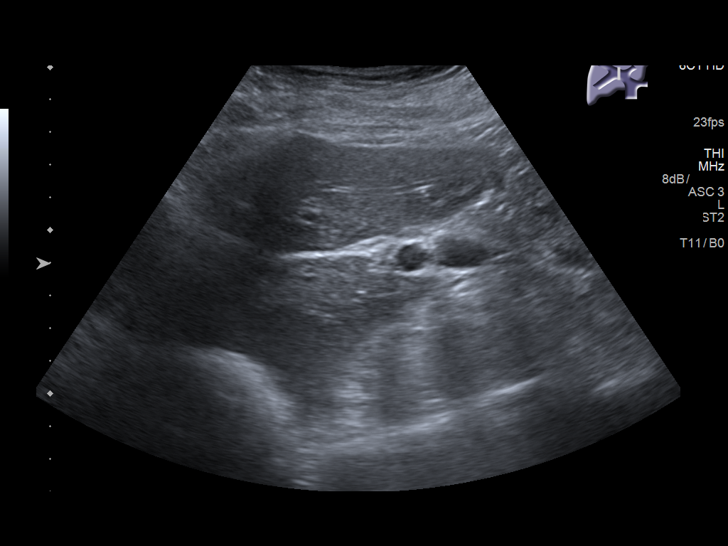
[im 25/47]
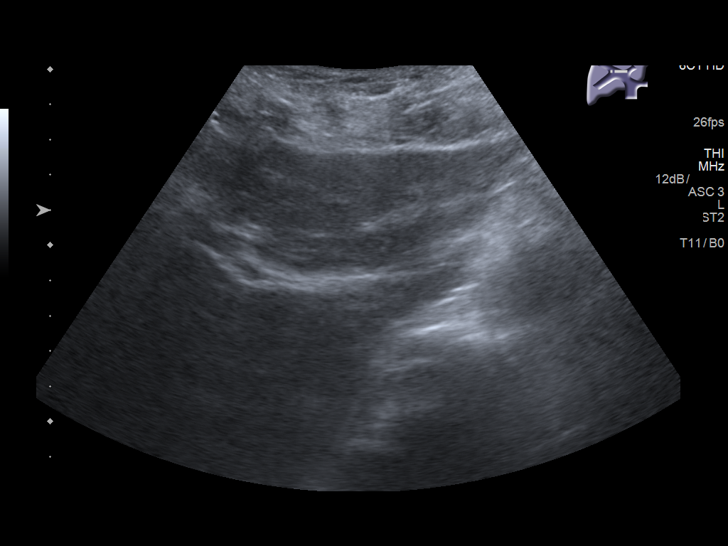
[im 29/47]
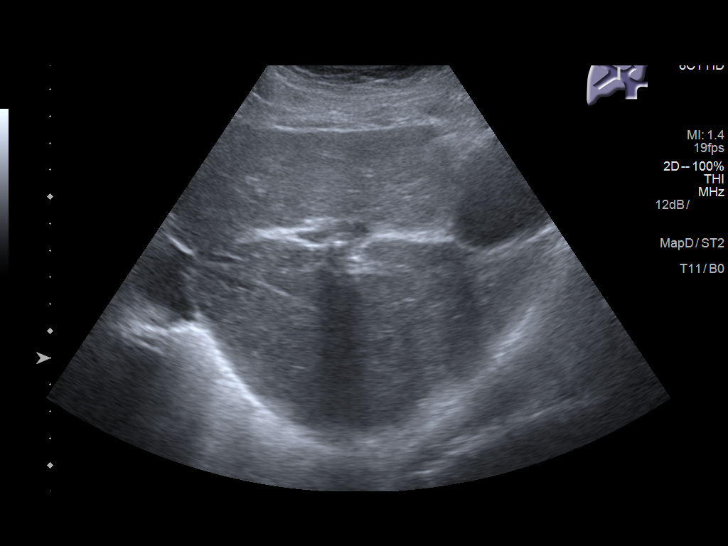
[im 31/47]
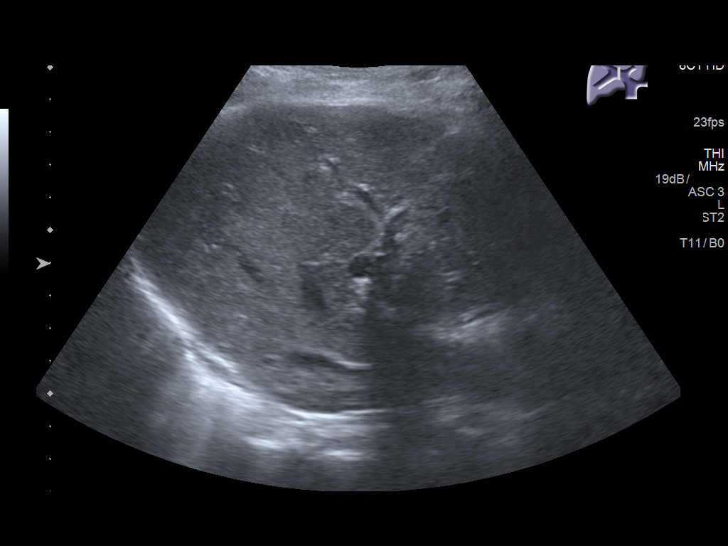
[im 35/47]
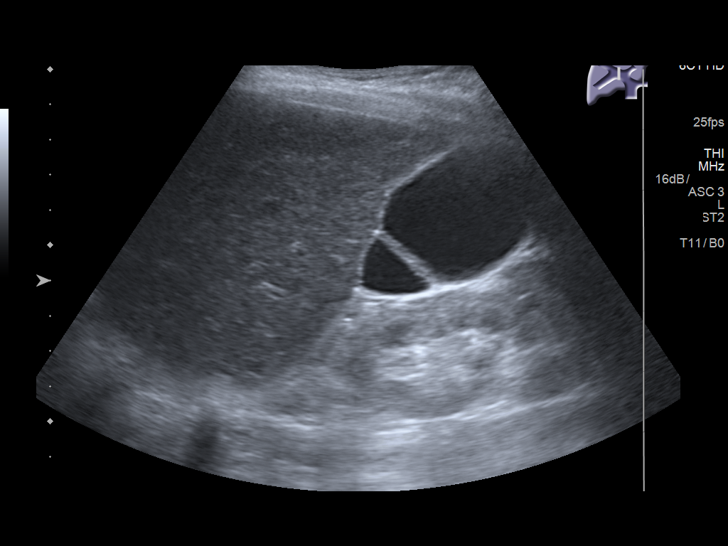
[im 39/47]
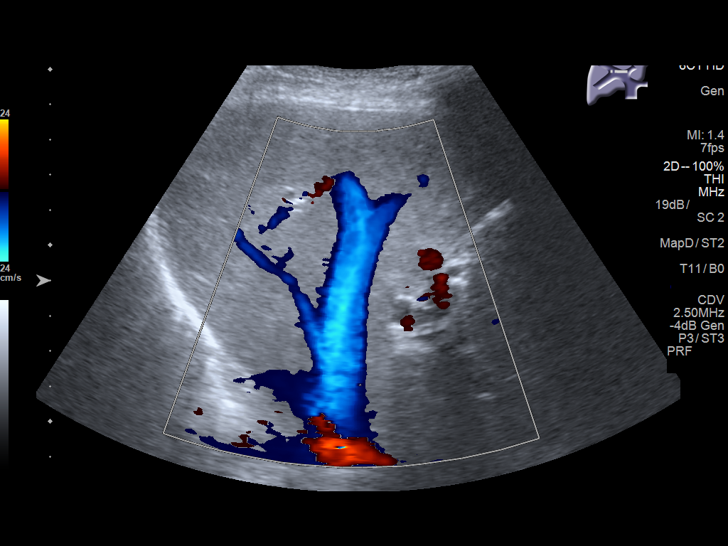
[im 43/47]
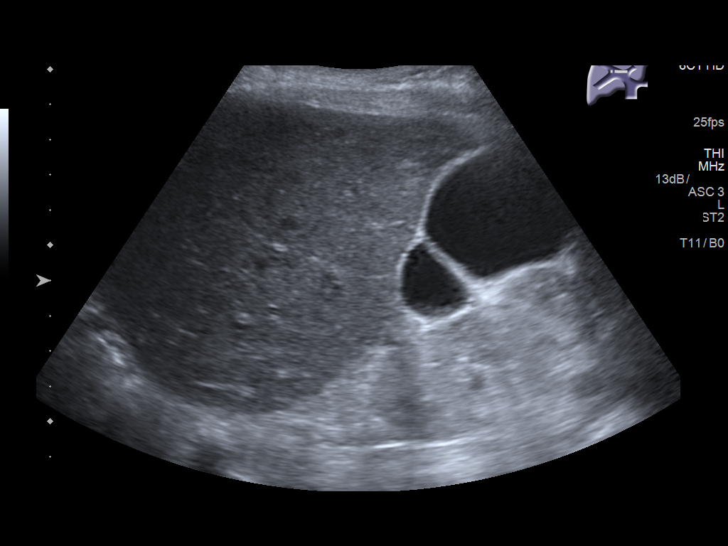
[im 47/47]
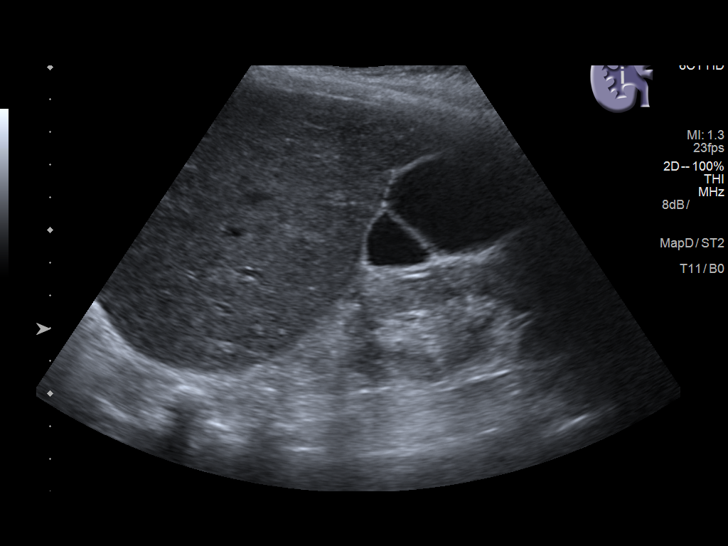

[14 of 25 positions shown; findings below may reference images not displayed]

FINDINGS: Gallbladder:

No gallstones or wall thickening visualized. No sonographic Murphy
sign noted by sonographer.

Common bile duct:

Diameter: 8.8 mm, within normal limits for age.

Liver:

No focal lesion identified. Within normal limits in parenchymal
echogenicity. Portal vein is patent on color Doppler imaging with
normal direction of blood flow towards the liver.
IMPRESSION: No significant abnormalities.

## 2018-10-18 ENCOUNTER — Other Ambulatory Visit: Payer: Self-pay | Admitting: Podiatry

## 2018-12-11 ENCOUNTER — Encounter: Payer: Self-pay | Admitting: Podiatry

## 2018-12-11 ENCOUNTER — Other Ambulatory Visit: Payer: Self-pay

## 2018-12-11 ENCOUNTER — Ambulatory Visit: Payer: Medicare HMO | Admitting: Podiatry

## 2018-12-11 VITALS — Temp 98.9°F

## 2018-12-11 DIAGNOSIS — B353 Tinea pedis: Secondary | ICD-10-CM

## 2018-12-11 DIAGNOSIS — M79676 Pain in unspecified toe(s): Secondary | ICD-10-CM | POA: Diagnosis not present

## 2018-12-11 DIAGNOSIS — B351 Tinea unguium: Secondary | ICD-10-CM

## 2018-12-11 MED ORDER — CLOBETASOL PROPIONATE 0.05 % EX OINT: 1.0000 "application " | TOPICAL_OINTMENT | Freq: Two times a day (BID) | CUTANEOUS | 2 refills | Status: AC

## 2018-12-11 NOTE — Progress Notes (Addendum)
Complaint:  Visit Type: Patient returns to my office for continued preventative foot care services. Complaint: Patient states" my nails have grown long and thick and become painful to walk and wear shoes"  The patient presents for preventative foot care services. No changes to ROS.  She also says she has skin rash on the arch of her right foot.  She says this is extremely itchy.   Podiatric Exam: Vascular: dorsalis pedis and posterior tibial pulses are palpable bilateral. Capillary return is immediate. Temperature gradient is WNL. Skin turgor WNL  Sensorium: Normal Semmes Weinstein monofilament test. Normal tactile sensation bilaterally. Nail Exam: Pt has thick disfigured discolored nails with subungual debris noted bilateral entire nail hallux through fifth toenails Ulcer Exam: There is no evidence of ulcer or pre-ulcerative changes or infection. Orthopedic Exam: Muscle tone and strength are WNL. No limitations in general ROM. No crepitus or effusions noted. Foot type and digits show no abnormalities. Bony prominences are unremarkable. Skin: No Porokeratosis. No infection or ulcers.  Multiple vessicular lesions right foot arch.  Diagnosis:  Onychomycosis, , Pain in right toe, pain in left toes Tinea pedis   Treatment & Plan Procedures and Treatment: Consent by patient was obtained for treatment procedures.   Debridement of mycotic and hypertrophic toenails, 1 through 5 bilateral and clearing of subungual debris. No ulceration, no infection noted. Prescribe lotrisone cream for home usage. Prescribe  Temovate. Return Visit-Office Procedure: Patient instructed to return to the office for a follow up visit 3 months for continued evaluation and treatment.    Gardiner Barefoot DPM

## 2019-01-21 ENCOUNTER — Other Ambulatory Visit: Payer: Self-pay | Admitting: Podiatry

## 2019-03-12 ENCOUNTER — Ambulatory Visit: Payer: Medicare HMO | Admitting: Podiatry

## 2019-04-06 ENCOUNTER — Other Ambulatory Visit: Payer: Self-pay

## 2019-04-06 ENCOUNTER — Emergency Department: Payer: Medicare HMO

## 2019-04-06 ENCOUNTER — Encounter: Payer: Self-pay | Admitting: *Deleted

## 2019-04-06 ENCOUNTER — Inpatient Hospital Stay
Admission: EM | Admit: 2019-04-06 | Discharge: 2019-04-10 | DRG: 470 | Disposition: A | Discharge status: Skilled Nursing Facility | Payer: Medicare HMO | Source: Ambulatory Visit | Attending: Internal Medicine | Admitting: Internal Medicine

## 2019-04-06 DIAGNOSIS — Z87891 Personal history of nicotine dependence: Secondary | ICD-10-CM

## 2019-04-06 DIAGNOSIS — S72002A Fracture of unspecified part of neck of left femur, initial encounter for closed fracture: Secondary | ICD-10-CM | POA: Diagnosis present

## 2019-04-06 DIAGNOSIS — Z20828 Contact with and (suspected) exposure to other viral communicable diseases: Secondary | ICD-10-CM | POA: Diagnosis present

## 2019-04-06 DIAGNOSIS — Y92039 Unspecified place in apartment as the place of occurrence of the external cause: Secondary | ICD-10-CM | POA: Diagnosis not present

## 2019-04-06 DIAGNOSIS — Z66 Do not resuscitate: Secondary | ICD-10-CM | POA: Diagnosis present

## 2019-04-06 DIAGNOSIS — I129 Hypertensive chronic kidney disease with stage 1 through stage 4 chronic kidney disease, or unspecified chronic kidney disease: Secondary | ICD-10-CM | POA: Diagnosis present

## 2019-04-06 DIAGNOSIS — Z7982 Long term (current) use of aspirin: Secondary | ICD-10-CM | POA: Diagnosis not present

## 2019-04-06 DIAGNOSIS — N390 Urinary tract infection, site not specified: Secondary | ICD-10-CM | POA: Diagnosis present

## 2019-04-06 DIAGNOSIS — Z96649 Presence of unspecified artificial hip joint: Secondary | ICD-10-CM

## 2019-04-06 DIAGNOSIS — D62 Acute posthemorrhagic anemia: Secondary | ICD-10-CM | POA: Diagnosis not present

## 2019-04-06 DIAGNOSIS — N183 Chronic kidney disease, stage 3 (moderate): Secondary | ICD-10-CM | POA: Diagnosis present

## 2019-04-06 DIAGNOSIS — M109 Gout, unspecified: Secondary | ICD-10-CM | POA: Diagnosis present

## 2019-04-06 DIAGNOSIS — F0390 Unspecified dementia without behavioral disturbance: Secondary | ICD-10-CM | POA: Diagnosis present

## 2019-04-06 DIAGNOSIS — S72012A Unspecified intracapsular fracture of left femur, initial encounter for closed fracture: Principal | ICD-10-CM | POA: Diagnosis present

## 2019-04-06 DIAGNOSIS — Z833 Family history of diabetes mellitus: Secondary | ICD-10-CM

## 2019-04-06 DIAGNOSIS — W19XXXA Unspecified fall, initial encounter: Secondary | ICD-10-CM

## 2019-04-06 DIAGNOSIS — N3289 Other specified disorders of bladder: Secondary | ICD-10-CM | POA: Diagnosis present

## 2019-04-06 DIAGNOSIS — W010XXA Fall on same level from slipping, tripping and stumbling without subsequent striking against object, initial encounter: Secondary | ICD-10-CM | POA: Diagnosis present

## 2019-04-06 DIAGNOSIS — R9431 Abnormal electrocardiogram [ECG] [EKG]: Secondary | ICD-10-CM | POA: Diagnosis present

## 2019-04-06 DIAGNOSIS — R339 Retention of urine, unspecified: Secondary | ICD-10-CM | POA: Diagnosis not present

## 2019-04-06 DIAGNOSIS — Z880 Allergy status to penicillin: Secondary | ICD-10-CM | POA: Diagnosis not present

## 2019-04-06 DIAGNOSIS — E785 Hyperlipidemia, unspecified: Secondary | ICD-10-CM | POA: Diagnosis present

## 2019-04-06 DIAGNOSIS — F329 Major depressive disorder, single episode, unspecified: Secondary | ICD-10-CM | POA: Diagnosis present

## 2019-04-06 DIAGNOSIS — Z79899 Other long term (current) drug therapy: Secondary | ICD-10-CM | POA: Diagnosis not present

## 2019-04-06 DIAGNOSIS — K219 Gastro-esophageal reflux disease without esophagitis: Secondary | ICD-10-CM | POA: Diagnosis present

## 2019-04-06 DIAGNOSIS — J309 Allergic rhinitis, unspecified: Secondary | ICD-10-CM | POA: Diagnosis present

## 2019-04-06 LAB — URINALYSIS, ROUTINE W REFLEX MICROSCOPIC
Bilirubin Urine: NEGATIVE
Glucose, UA: NEGATIVE mg/dL
Hgb urine dipstick: NEGATIVE
Ketones, ur: 5 mg/dL — AB
Nitrite: POSITIVE — AB
Protein, ur: NEGATIVE mg/dL
Specific Gravity, Urine: 1.014 (ref 1.005–1.030)
pH: 5 (ref 5.0–8.0)

## 2019-04-06 LAB — BASIC METABOLIC PANEL
Anion gap: 8 (ref 5–15)
BUN: 34 mg/dL — ABNORMAL HIGH (ref 8–23)
CO2: 25 mmol/L (ref 22–32)
Calcium: 9.7 mg/dL (ref 8.9–10.3)
Chloride: 105 mmol/L (ref 98–111)
Creatinine, Ser: 1.26 mg/dL — ABNORMAL HIGH (ref 0.44–1.00)
GFR calc Af Amer: 42 mL/min — ABNORMAL LOW (ref >60)
GFR calc non Af Amer: 36 mL/min — ABNORMAL LOW (ref >60)
Glucose, Bld: 117 mg/dL — ABNORMAL HIGH (ref 70–99)
Potassium: 4.3 mmol/L (ref 3.5–5.1)
Sodium: 138 mmol/L (ref 135–145)

## 2019-04-06 LAB — CBC
HCT: 38.2 % (ref 36.0–46.0)
Hemoglobin: 12.3 g/dL (ref 12.0–15.0)
MCH: 29.7 pg (ref 26.0–34.0)
MCHC: 32.2 g/dL (ref 30.0–36.0)
MCV: 92.3 fL (ref 80.0–100.0)
Platelets: 174 10*3/uL (ref 150–400)
RBC: 4.14 MIL/uL (ref 3.87–5.11)
RDW: 13.2 % (ref 11.5–15.5)
WBC: 12.9 10*3/uL — ABNORMAL HIGH (ref 4.0–10.5)
nRBC: 0.2 % (ref 0.0–0.2)

## 2019-04-06 LAB — SURGICAL PCR SCREEN
MRSA, PCR: NEGATIVE
Staphylococcus aureus: NEGATIVE

## 2019-04-06 LAB — SARS CORONAVIRUS 2 BY RT PCR (HOSPITAL ORDER, PERFORMED IN ~~LOC~~ HOSPITAL LAB): SARS Coronavirus 2: NEGATIVE

## 2019-04-06 MED ORDER — TRIAMCINOLONE ACETONIDE 0.1 % EX OINT
TOPICAL_OINTMENT | Freq: Two times a day (BID) | CUTANEOUS | Status: DC
  Administered 2019-04-08: 22:00:00 via TOPICAL
  Filled 2019-04-06: qty 15

## 2019-04-06 MED ORDER — MIRTAZAPINE 15 MG PO TABS
15.0000 mg | ORAL_TABLET | Freq: Every day | ORAL | Status: DC
  Administered 2019-04-06 – 2019-04-09 (×3): 15 mg via ORAL
  Filled 2019-04-06 (×4): qty 1

## 2019-04-06 MED ORDER — MORPHINE SULFATE (PF) 2 MG/ML IV SOLN
2.0000 mg | INTRAVENOUS | Status: DC | PRN
  Administered 2019-04-06: 2 mg via INTRAVENOUS
  Filled 2019-04-06: qty 1

## 2019-04-06 MED ORDER — ACETAMINOPHEN 500 MG PO TABS: 1000.0000 mg | ORAL_TABLET | Freq: Four times a day (QID) | ORAL | Status: DC | PRN

## 2019-04-06 MED ORDER — FENTANYL CITRATE (PF) 100 MCG/2ML IJ SOLN
50.0000 ug | Freq: Once | INTRAMUSCULAR | Status: AC
  Administered 2019-04-06: 50 ug via INTRAVENOUS
  Filled 2019-04-06: qty 2

## 2019-04-06 MED ORDER — ONDANSETRON HCL 4 MG/2ML IJ SOLN
4.0000 mg | Freq: Once | INTRAMUSCULAR | Status: AC
  Administered 2019-04-06: 4 mg via INTRAVENOUS
  Filled 2019-04-06: qty 2

## 2019-04-06 MED ORDER — ENSURE ENLIVE PO LIQD
237.0000 mL | Freq: Two times a day (BID) | ORAL | Status: DC
  Administered 2019-04-08 – 2019-04-10 (×4): 237 mL via ORAL

## 2019-04-06 MED ORDER — CLOTRIMAZOLE 1 % EX CREA
TOPICAL_CREAM | Freq: Two times a day (BID) | CUTANEOUS | Status: DC | PRN
  Filled 2019-04-06: qty 15

## 2019-04-06 MED ORDER — AMLODIPINE BESYLATE 5 MG PO TABS
5.0000 mg | ORAL_TABLET | Freq: Every day | ORAL | Status: DC
  Administered 2019-04-06 – 2019-04-10 (×4): 5 mg via ORAL
  Filled 2019-04-06 (×4): qty 1

## 2019-04-06 MED ORDER — SIMVASTATIN 20 MG PO TABS
20.0000 mg | ORAL_TABLET | Freq: Every day | ORAL | Status: DC
  Administered 2019-04-06 – 2019-04-10 (×4): 20 mg via ORAL
  Filled 2019-04-06 (×4): qty 1

## 2019-04-06 MED ORDER — ESCITALOPRAM OXALATE 10 MG PO TABS
20.0000 mg | ORAL_TABLET | Freq: Every day | ORAL | Status: DC
  Administered 2019-04-08 – 2019-04-10 (×3): 20 mg via ORAL
  Filled 2019-04-06 (×5): qty 2

## 2019-04-06 MED ORDER — CLOBETASOL PROPIONATE 0.05 % EX OINT
1.0000 "application " | TOPICAL_OINTMENT | Freq: Two times a day (BID) | CUTANEOUS | Status: DC
  Administered 2019-04-08: 1 via TOPICAL
  Filled 2019-04-06: qty 15

## 2019-04-06 MED ORDER — ALLOPURINOL 100 MG PO TABS
100.0000 mg | ORAL_TABLET | Freq: Every day | ORAL | Status: DC
  Administered 2019-04-08 – 2019-04-10 (×3): 100 mg via ORAL
  Filled 2019-04-06 (×4): qty 1

## 2019-04-06 MED ORDER — OXYBUTYNIN CHLORIDE ER 5 MG PO TB24
5.0000 mg | ORAL_TABLET | Freq: Every day | ORAL | Status: DC
  Administered 2019-04-06 – 2019-04-10 (×4): 5 mg via ORAL
  Filled 2019-04-06 (×4): qty 1

## 2019-04-06 MED ORDER — METOPROLOL TARTRATE 25 MG PO TABS
25.0000 mg | ORAL_TABLET | Freq: Every day | ORAL | Status: DC
  Filled 2019-04-06: qty 1

## 2019-04-06 MED ORDER — CLINDAMYCIN PHOSPHATE 600 MG/50ML IV SOLN
600.0000 mg | Freq: Once | INTRAVENOUS | Status: DC
  Filled 2019-04-06: qty 50

## 2019-04-06 NOTE — ED Notes (Signed)
Attempted to call report. Unable to give report at this time due to [place] RN being in another pt room

## 2019-04-06 NOTE — Consult Note (Signed)
ORTHOPAEDIC CONSULTATION  REQUESTING PHYSICIAN: Otila Back, MD  Chief Complaint:   Left hip pain.  History of Present Illness: Kimberly Abbott is a 85 y.o. female who has a history of hypertension, hyperlipidemia, chronic kidney disease, and gastroesophageal reflux disease lives independently, but ambulates with either a cane or walker "for balance", was in her usual state of health this morning when she went to her cabinet to "get out some money to do laundry".  She apparently lost her balance and fell onto her left hip.  She managed to pull herself up to sit on the side of her bed but could not walk any further.  She was brought to the [facility] urgent care facility where x-rays demonstrated a displaced left femoral neck fracture.  Therefore, the patient was transferred to the emergency room and is being admitted at this time for definitive management of her injury.  The patient denies any associated injury resulting from the fall.  She did not strike her head or lose consciousness.  She also denies any lightheadedness, dizziness, chest pain, shortness of breath, or other symptoms which may have precipitated her fall.  Past Medical History:  Diagnosis Date  . Allergy   . CKD (chronic kidney disease)   . GERD (gastroesophageal reflux disease)   . Hyperlipidemia   . Hypertension    Past Surgical History:  Procedure Laterality Date  . APPENDECTOMY    . ESOPHAGOGASTRODUODENOSCOPY (EGD) WITH PROPOFOL N/A 03/12/2017   Procedure: ESOPHAGOGASTRODUODENOSCOPY (EGD) WITH PROPOFOL;  Surgeon: Lucilla Lame, MD;  Location: [facility] ENDOSCOPY;  Service: Endoscopy;  Laterality: N/A;  . rectal fistula  in the 40's  . TUBAL LIGATION     Social History   Socioeconomic History  . Marital status: Widowed    Spouse name: Not on file  . Number of children: 1  . Years of education: Not on file  . Highest education level: 12th grade   Occupational History  . Occupation: Retired  Scientific laboratory technician  . Financial resource strain: Not hard at all  . Food insecurity    Worry: Never true    Inability: Never true  . Transportation needs    Medical: No    Non-medical: No  Tobacco Use  . Smoking status: Former Smoker    Packs/day: 0.25    Years: 40.00    Pack years: 10.00    Types: Cigarettes    Quit date: 1980    Years since quitting: 40.7  . Smokeless tobacco: Never Used  . Tobacco comment: Smoking cessation materials not required  Substance and Sexual Activity  . Alcohol use: No    Alcohol/week: 0.0 standard drinks  . Drug use: No  . Sexual activity: Not Currently  Lifestyle  . Physical activity    Days per week: 0 days    Minutes per session: 0 min  . Stress: To some extent  Relationships  . Social connections    Talks on phone: More than three times a week    Gets together: Once a week    Attends religious service: More than 4 times per year    Active member of club or organization: No    Attends meetings of clubs or organizations: Never    Relationship status: Widowed  Other Topics Concern  . Not on file  Social History Narrative  . Not on file   Family History  Problem Relation Age of Onset  . Diabetes Mother    Allergies  Allergen Reactions  . Penicillin G Hives  .  Penicillins Hives    Has patient had a PCN reaction causing immediate rash, facial/tongue/throat swelling, SOB or lightheadedness with hypotension: No Has patient had a PCN reaction causing severe rash involving mucus membranes or skin necrosis: No Has patient had a PCN reaction that required hospitalization No Has patient had a PCN reaction occurring within the last 10 years: No If all of the above answers are "NO", then may proceed with Cephalosporin use.   Prior to Admission medications   Medication Sig Start Date End Date Taking? Authorizing Provider  acetaminophen (TYLENOL) 500 MG tablet Take 2 tablets (1,000 mg total) 2 (two)  times daily as needed by mouth. 06/12/17   Plonk, Gwyndolyn Saxon, MD  allopurinol (ZYLOPRIM) 100 MG tablet Take 1 tablet (100 mg total) by mouth daily. 10/23/17   Plonk, Gwyndolyn Saxon, MD  amLODipine (NORVASC) 5 MG tablet Take 5 mg by mouth daily. for high blood pressure 03/17/18   [provider]  aspirin 81 MG chewable tablet Chew 81 mg by mouth daily.     [provider]  betamethasone dipropionate (DIPROLENE) 0.05 % cream Apply topically 2 (two) times daily. 06/20/18   Edrick Kins, DPM  cetirizine (ZYRTEC) 5 MG tablet Take 1 tablet (5 mg total) by mouth daily. 10/23/17   Plonk, Gwyndolyn Saxon, MD  clobetasol ointment (TEMOVATE) AB-123456789 % Apply 1 application topically 2 (two) times daily. 12/11/18   Gardiner Barefoot, DPM  clotrimazole-betamethasone (LOTRISONE) cream APPLY  CREAM TOPICALLY TWICE DAILY 01/26/19   Edrick Kins, DPM  escitalopram (LEXAPRO) 20 MG tablet Take 20 mg by mouth daily. 04/03/19   [provider]  feeding supplement, ENSURE ENLIVE, (ENSURE ENLIVE) LIQD Take 237 mLs by mouth 2 (two) times daily between meals. 06/10/16   Fritzi Mandes, MD  metoprolol tartrate (LOPRESSOR) 25 MG tablet Take 25 mg by mouth daily. for high blood pressure 03/17/18   [provider]  mirtazapine (REMERON) 15 MG tablet Take 1 tablet (15 mg total) by mouth at bedtime. 10/23/17   Plonk, Gwyndolyn Saxon, MD  oxybutynin (DITROPAN-XL) 5 MG 24 hr tablet Take 5 mg by mouth daily. 04/03/19   [provider]  simvastatin (ZOCOR) 20 MG tablet Take 20 mg by mouth daily at 6 PM.  10/17/18   [provider]  triamcinolone ointment (KENALOG) 0.1 % Apply twice a day to affected areas for back. 03/14/15   [provider]   Dg Chest 1 View  Result Date: 04/06/2019 CLINICAL DATA:  Preoperative radiograph EXAM: CHEST  1 VIEW COMPARISON:  CT angiogram of the chest September 05, 2016 FINDINGS: The cardiac silhouette is mildly enlarged. Mediastinal contours appear intact. Calcific atherosclerotic disease  and tortuosity of the aorta There is no evidence of focal airspace consolidation, pleural effusion or pneumothorax. Osseous structures are without acute abnormality. Soft tissues are grossly normal. IMPRESSION: 1. Mildly enlarged cardiac silhouette. 2. Calcific atherosclerotic disease and tortuosity of the aorta. Electronically Signed   By: Fidela Salisbury M.D.   On: 04/06/2019 12:30   Dg Hip Unilat With Pelvis 2-3 Views Left  Result Date: 04/06/2019 CLINICAL DATA:  Left hip pain after fall today.  Initial encounter. EXAM: DG HIP (WITH OR WITHOUT PELVIS) 2-3V LEFT COMPARISON:  None. FINDINGS: The patient has an acute subcapital fracture of the left hip. No other acute abnormality is identified. Bones are osteopenic. Lower lumbar spondylosis noted. Atherosclerosis is seen. IMPRESSION: Acute subcapital fracture left hip. Electronically Signed   By: Inge Rise M.D.   On: 04/06/2019 12:28  Positive ROS: All other systems have been reviewed and were otherwise negative with the exception of those mentioned in the HPI and as above.  Physical Exam: General:  Alert, no acute distress Psychiatric:  Patient is competent for consent with normal mood and affect   Cardiovascular:  No pedal edema Respiratory:  No wheezing, non-labored breathing GI:  Abdomen is soft and non-tender Skin:  No lesions in the area of chief complaint Neurologic:  Sensation intact distally Lymphatic:  No axillary or cervical lymphadenopathy  Orthopedic Exam:  Orthopedic examination is limited to left hip and lower extremity.  The left lower extremity is somewhat shortened and externally rotated as compared to the right.  Skin inspection around the left hip is unremarkable.  No swelling, erythema, ecchymosis, abrasions, or other skin abnormalities are identified.  She has mild tenderness to palpation of the lateral aspect of the left hip.  She has more severe pain with any attempted active or passive motion of the hip.  She  is able dorsiflex and plantarflex her toes and ankle on the left.  Sensation is intact light touch to all distributions in the left lower extremity and foot.  She has good capillary refill to her left foot.  X-rays:  X-rays of the pelvis and left hip are available for review and have been reviewed by myself.  These films demonstrate a displaced left femoral neck fracture.  The hip joint itself is well-maintained and without significant degenerative changes.  No lytic lesions or other acute bony abnormalities are identified.  Assessment: Displaced left femoral neck fracture.  Plan: The treatment options have been discussed with the patient and her daughter (granddaughter?), who is at the bedside, including both the surgical and nonsurgical choices.  Both the patient and her family member would like to proceed with surgical intervention to include a left hip hemiarthroplasty.  This procedure has been discussed in detail with the patient and her family member, as have the potential risks (including bleeding, infection, nerve and blood vessel injury, persistent or recurrent pain, stiffness of the hip, loosening of end or failure of the components, hip dislocation, leg length inequality, progression to degenerative joint disease, need for further surgery, blood clots, strokes, heart attacks and arrhythmias, etc.) and benefits.  The patient states her understanding and wishes to proceed.  A formal written consent will be obtained by the nursing staff.  Thank you for asked me to participate in the care of this most pleasant woman.  I will be happy to follow her with you.   Pascal Lux, MD  Beeper #:  [phone]  04/06/2019 3:09 PM

## 2019-04-06 NOTE — ED Notes (Signed)
1A contacted this RN to report that bed had arrived to room

## 2019-04-06 NOTE — ED Notes (Signed)
ED TO INPATIENT HANDOFF REPORT  ED Nurse Name and Phone #: Jordynne Mccown 78  S Name/Age/Gender Kimberly Abbott 85 y.o. female Room/Bed: ED08A/ED08A  Code Status   Code Status: DNR  Home/SNF/Other Home Patient oriented x 4 Is this baseline? yes  Triage Complete: Triage complete  Chief Complaint brought by kc/left hip fracture  Triage Note Pt to ED after a fall and confirmed left hip fracture with an Xray at [facility] today.    Allergies Allergies  Allergen Reactions  . Penicillin G Hives  . Penicillins Hives    Has patient had a PCN reaction causing immediate rash, facial/tongue/throat swelling, SOB or lightheadedness with hypotension: No Has patient had a PCN reaction causing severe rash involving mucus membranes or skin necrosis: No Has patient had a PCN reaction that required hospitalization No Has patient had a PCN reaction occurring within the last 10 years: No If all of the above answers are "NO", then may proceed with Cephalosporin use.    Level of Care/Admitting Diagnosis ED Disposition    ED Disposition Condition [facility] Area: [place] [[id number]]  Level of Care: Med-Surg [16]  Covid Evaluation: Confirmed COVID Negative  Diagnosis: Closed left hip fracture [facility]) [id number]  Admitting Physician: Otila Back [place]  Attending Physician: Otila Back [[id number]]  Estimated length of stay: 3 - 4 days  Certification:: I certify this patient will need inpatient services for at least 2 midnights  PT Class (Do Not Modify): Inpatient [101]  PT Acc Code (Do Not Modify): Private [1]       B Medical/Surgery History Past Medical History:  Diagnosis Date  . Allergy   . CKD (chronic kidney disease)   . GERD (gastroesophageal reflux disease)   . Hyperlipidemia   . Hypertension    Past Surgical History:  Procedure Laterality Date  . APPENDECTOMY    . ESOPHAGOGASTRODUODENOSCOPY (EGD) WITH PROPOFOL N/A 03/12/2017   Procedure:  ESOPHAGOGASTRODUODENOSCOPY (EGD) WITH PROPOFOL;  Surgeon: Lucilla Lame, MD;  Location: [facility] ENDOSCOPY;  Service: Endoscopy;  Laterality: N/A;  . rectal fistula  in the 40's  . TUBAL LIGATION       A IV Location/Drains/Wounds Patient Lines/Drains/Airways Status   Active Line/Drains/Airways    Name:   Placement date:   Placement time:   Site:   Days:   Peripheral IV 04/06/19 Left Antecubital   04/06/19    1201    Antecubital   less than 1   Airway 6 mm   03/12/17    1256     755          Intake/Output Last 24 hours No intake or output data in the 24 hours ending 04/06/19 1530  Labs/Imaging Results for orders placed or performed during the hospital encounter of 04/06/19 (from the past 48 hour(s))  CBC     Status: Abnormal   Collection Time: 04/06/19 11:41 AM  Result Value Ref Range   WBC 12.9 (H) 4.0 - 10.5 K/uL   RBC 4.14 3.87 - 5.11 MIL/uL   Hemoglobin 12.3 12.0 - 15.0 g/dL   HCT 38.2 36.0 - 46.0 %   MCV 92.3 80.0 - 100.0 fL   MCH 29.7 26.0 - 34.0 pg   MCHC 32.2 30.0 - 36.0 g/dL   RDW 13.2 11.5 - 15.5 %   Platelets 174 150 - 400 K/uL   nRBC 0.2 0.0 - 0.2 %    Comment: Performed at [facility], [address] [place], [place] [address]  Basic metabolic panel     Status: Abnormal   Collection Time: 04/06/19 11:41 AM  Result Value Ref Range   Sodium 138 135 - 145 mmol/L   Potassium 4.3 3.5 - 5.1 mmol/L   Chloride 105 98 - 111 mmol/L   CO2 25 22 - 32 mmol/L   Glucose, Bld 117 (H) 70 - 99 mg/dL   BUN 34 (H) 8 - 23 mg/dL   Creatinine, Ser 1.26 (H) 0.44 - 1.00 mg/dL   Calcium 9.7 8.9 - 10.3 mg/dL   GFR calc non Af Amer 36 (L) >60 mL/min   GFR calc Af Amer 42 (L) >60 mL/min   Anion gap 8 5 - 15    Comment: Performed at [facility], [address] [place], [place] [address]  SARS Coronavirus 2 [facility] order, Performed in [facility] hospital lab) Nasopharyngeal Nasopharyngeal Swab     Status: None   Collection Time: 04/06/19 11:41 AM   Specimen:  Nasopharyngeal Swab  Result Value Ref Range   SARS Coronavirus 2 NEGATIVE NEGATIVE    Comment: (NOTE) If result is NEGATIVE SARS-CoV-2 target nucleic acids are NOT DETECTED. The SARS-CoV-2 RNA is generally detectable in upper and lower  respiratory specimens during the acute phase of infection. The lowest  concentration of SARS-CoV-2 viral copies this assay can detect is 250  copies / mL. A negative result does not preclude SARS-CoV-2 infection  and should not be used as the sole basis for treatment or other  patient management decisions.  A negative result may occur with  improper specimen collection / handling, submission of specimen other  than nasopharyngeal swab, presence of viral mutation(s) within the  areas targeted by this assay, and inadequate number of viral copies  (<250 copies / mL). A negative result must be combined with clinical  observations, patient history, and epidemiological information. If result is POSITIVE SARS-CoV-2 target nucleic acids are DETECTED. The SARS-CoV-2 RNA is generally detectable in upper and lower  respiratory specimens dur ing the acute phase of infection.  Positive  results are indicative of active infection with SARS-CoV-2.  Clinical  correlation with patient history and other diagnostic information is  necessary to determine patient infection status.  Positive results do  not rule out bacterial infection or co-infection with other viruses. If result is PRESUMPTIVE POSTIVE SARS-CoV-2 nucleic acids MAY BE PRESENT.   A presumptive positive result was obtained on the submitted specimen  and confirmed on repeat testing.  While 2019 novel coronavirus  (SARS-CoV-2) nucleic acids may be present in the submitted sample  additional confirmatory testing may be necessary for epidemiological  and / or clinical management purposes  to differentiate between  SARS-CoV-2 and other Sarbecovirus currently known to infect humans.  If clinically indicated  additional testing with an alternate test  methodology [id number]) is advised. The SARS-CoV-2 RNA is generally  detectable in upper and lower respiratory sp ecimens during the acute  phase of infection. The expected result is Negative. Fact Sheet for Patients:  StrictlyIdeas.no Fact Sheet for Healthcare Providers: BankingDealers.co.za This test is not yet approved or cleared by the [place] FDA and has been authorized for detection and/or diagnosis of SARS-CoV-2 by FDA under an Emergency Use Authorization (EUA).  This EUA will remain in effect (meaning this test can be used) for the duration of the COVID-19 declaration under Section 564(b)(1) of the Act, 21 U.S.C. section 360bbb-3(b)(1), unless the authorization is terminated or revoked sooner. Performed at [facility], [address] [place], [place] [address]    Dg Chest 1 View  Result Date: 04/06/2019 CLINICAL DATA:  Preoperative radiograph EXAM: CHEST  1 VIEW COMPARISON:  CT angiogram of the chest September 05, 2016 FINDINGS: The cardiac silhouette is mildly enlarged. Mediastinal contours appear intact. Calcific atherosclerotic disease and tortuosity of the aorta There is no evidence of focal airspace consolidation, pleural effusion or pneumothorax. Osseous structures are without acute abnormality. Soft tissues are grossly normal. IMPRESSION: 1. Mildly enlarged cardiac silhouette. 2. Calcific atherosclerotic disease and tortuosity of the aorta. Electronically Signed   By: Fidela Salisbury M.D.   On: 04/06/2019 12:30   Dg Hip Unilat With Pelvis 2-3 Views Left  Result Date: 04/06/2019 CLINICAL DATA:  Left hip pain after fall today.  Initial encounter. EXAM: DG HIP (WITH OR WITHOUT PELVIS) 2-3V LEFT COMPARISON:  None. FINDINGS: The patient has an acute subcapital fracture of the left hip. No other acute abnormality is identified. Bones are osteopenic. Lower lumbar spondylosis  noted. Atherosclerosis is seen. IMPRESSION: Acute subcapital fracture left hip. Electronically Signed   By: Inge Rise M.D.   On: 04/06/2019 12:28    Pending Labs Unresulted Labs (From admission, onward)    Start     Ordered   04/07/19 [id number]  Basic metabolic panel  Tomorrow morning,   STAT     04/06/19 1450   04/07/19 0500  CBC  Tomorrow morning,   STAT     04/06/19 1450   04/07/19 0500  Magnesium  Tomorrow morning,   STAT     04/06/19 1450   04/06/19 1456  Urinalysis, Routine w reflex microscopic  Once,   STAT     04/06/19 1455          Vitals/Pain Today's Vitals   04/06/19 1121 04/06/19 1230 04/06/19 1300 04/06/19 1344  BP:  (!) 174/67 (!) 176/73   Pulse: (!) 59  70   Resp:  19 18   Temp:      TempSrc:      SpO2: 100%  96%   Weight:      PainSc:    8     Isolation Precautions No active isolations  Medications Medications  acetaminophen (TYLENOL) tablet 1,000 mg (has no administration in time range)  allopurinol (ZYLOPRIM) tablet 100 mg (has no administration in time range)  amLODipine (NORVASC) tablet 5 mg (has no administration in time range)  metoprolol tartrate (LOPRESSOR) tablet 25 mg (has no administration in time range)  simvastatin (ZOCOR) tablet 20 mg (has no administration in time range)  escitalopram (LEXAPRO) tablet 20 mg (has no administration in time range)  mirtazapine (REMERON) tablet 15 mg (has no administration in time range)  oxybutynin (DITROPAN-XL) 24 hr tablet 5 mg (has no administration in time range)  feeding supplement (ENSURE ENLIVE) (ENSURE ENLIVE) liquid 237 mL (has no administration in time range)  clobetasol ointment (TEMOVATE) AB-123456789 % 1 application (has no administration in time range)  clotrimazole (LOTRIMIN) 1 % cream (has no administration in time range)  triamcinolone ointment (KENALOG) 0.1 % (has no administration in time range)  morphine 2 MG/ML injection 2 mg (has no administration in time range)  clindamycin (CLEOCIN) IVPB  600 mg (has no administration in time range)  fentaNYL (SUBLIMAZE) injection 50 mcg (50 mcg Intravenous Given 04/06/19 1156)  ondansetron (ZOFRAN) injection 4 mg (4 mg Intravenous Given 04/06/19 1156)  fentaNYL (SUBLIMAZE) injection 50 mcg (50 mcg Intravenous Given 04/06/19 1345)    Mobility non-ambulatory Low fall risk   Focused  Assessments     R Recommendations: See Admitting Provider Note  Report given to:   Additional Notes:

## 2019-04-06 NOTE — ED Provider Notes (Addendum)
[facility] Emergency Department Provider Note  Time seen: 11:42 AM  I have reviewed the triage vital signs and the nursing notes.   HISTORY  Chief Complaint Hip Pain   HPI Kimberly Abbott is a 85 y.o. female with a past medical history of CKD, gastric reflux, hypertension, hyperlipidemia, mild dementia, presents to the emergency department after a fall.  According to the patient and her son patient had a fall early this morning landing on her left side.  Patient denies hitting her head, denies LOC.  Patient's only complaint is left hip pain.  They went to [facility] clinic and were diagnosed with a likely hip fracture and sent to the emergency department.  Here the patient appears well, is in pain but no significant discomfort unless ranging the leg.   Past Medical History:  Diagnosis Date  . Allergy   . CKD (chronic kidney disease)   . GERD (gastroesophageal reflux disease)   . Hyperlipidemia   . Hypertension     Patient Active Problem List   Diagnosis Date Noted  . Clostridium difficile colitis 04/30/2017  . Insomnia 04/24/2017  . Problems with swallowing and mastication   . SBO (small bowel obstruction) ([place]) 03/06/2017  . History of esophageal stricture 01/29/2017  . Hiatal hernia 01/29/2017  . Dementia ([place]) 01/29/2017  . Closed dislocation of tarsometatarsal joint 01/29/2017  . Vitamin D deficiency 01/29/2017  . Gait instability 01/28/2017  . Gout 01/28/2017  . Unintentional weight loss 01/28/2017  . Primary osteoarthritis of both hands 05/03/2016  . Chronic fatigue 09/01/2015  . Rotator cuff impingement syndrome of left shoulder 09/01/2015  . Pustular psoriasis of palms and soles 05/05/2015  . Chronic kidney disease, stage III (moderate) ([place]) 02/10/2015  . Allergic rhinitis 12/31/2014  . Dermatitis, eczematoid 12/31/2014  . Hyperlipidemia 11/08/2014  . Anemia 11/08/2014  . Recurrent UTI 11/08/2014  . Depression 11/08/2014  .  Gastroesophageal reflux disease without esophagitis 11/08/2014  . Bladder instability 11/08/2014  . At moderate risk for fall 11/08/2014  . Dysphagia 11/08/2014  . Rotator cuff syndrome of right shoulder 11/08/2014  . Essential hypertension 11/08/2014    Past Surgical History:  Procedure Laterality Date  . APPENDECTOMY    . ESOPHAGOGASTRODUODENOSCOPY (EGD) WITH PROPOFOL N/A 03/12/2017   Procedure: ESOPHAGOGASTRODUODENOSCOPY (EGD) WITH PROPOFOL;  Surgeon: Lucilla Lame, MD;  Location: [facility] ENDOSCOPY;  Service: Endoscopy;  Laterality: N/A;  . rectal fistula  in the 40's  . TUBAL LIGATION      Prior to Admission medications   Medication Sig Start Date End Date Taking? Authorizing Provider  acetaminophen (TYLENOL) 500 MG tablet Take 2 tablets (1,000 mg total) 2 (two) times daily as needed by mouth. 06/12/17   Plonk, Gwyndolyn Saxon, MD  allopurinol (ZYLOPRIM) 100 MG tablet Take 1 tablet (100 mg total) by mouth daily. 10/23/17   Plonk, Gwyndolyn Saxon, MD  amLODipine (NORVASC) 5 MG tablet Take 5 mg by mouth daily. for high blood pressure 03/17/18   [provider]  aspirin 81 MG chewable tablet Chew by mouth.    [provider]  betamethasone dipropionate (DIPROLENE) 0.05 % cream Apply topically 2 (two) times daily. 06/20/18   Edrick Kins, DPM  cetirizine (ZYRTEC) 5 MG tablet Take 1 tablet (5 mg total) by mouth daily. 10/23/17   Plonk, Gwyndolyn Saxon, MD  clobetasol ointment (TEMOVATE) AB-123456789 % Apply 1 application topically 2 (two) times daily. 12/11/18   Gardiner Barefoot, DPM  clotrimazole-betamethasone (LOTRISONE) cream APPLY  CREAM TOPICALLY TWICE DAILY 01/26/19   Amalia Hailey,  Dorathy Daft, DPM  feeding supplement, ENSURE ENLIVE, (ENSURE ENLIVE) LIQD Take 237 mLs by mouth 2 (two) times daily between meals. 06/10/16   Fritzi Mandes, MD  metoprolol tartrate (LOPRESSOR) 25 MG tablet Take 25 mg by mouth daily. for high blood pressure 03/17/18   [provider]  mirtazapine (REMERON) 15 MG tablet Take 1 tablet  (15 mg total) by mouth at bedtime. 10/23/17   Plonk, Gwyndolyn Saxon, MD  oxybutynin (DITROPAN) 5 MG tablet Take 5 mg by mouth 2 (two) times daily.    [provider]  simvastatin (ZOCOR) 20 MG tablet TAKE 1 TABLET BY MOUTH IN THE EVENING FOR HIGH CHOLESTEROL 10/17/18   [provider]  triamcinolone ointment (KENALOG) 0.1 % Apply twice a day to affected areas for back. 03/14/15   [provider]    Allergies  Allergen Reactions  . Penicillin G Hives  . Penicillins Hives    Has patient had a PCN reaction causing immediate rash, facial/tongue/throat swelling, SOB or lightheadedness with hypotension: No Has patient had a PCN reaction causing severe rash involving mucus membranes or skin necrosis: No Has patient had a PCN reaction that required hospitalization No Has patient had a PCN reaction occurring within the last 10 years: No If all of the above answers are "NO", then may proceed with Cephalosporin use.    Family History  Problem Relation Age of Onset  . Diabetes Mother     Social History Social History   Tobacco Use  . Smoking status: Former Smoker    Packs/day: 0.25    Years: 40.00    Pack years: 10.00    Types: Cigarettes    Quit date: 1980    Years since quitting: 40.7  . Smokeless tobacco: Never Used  . Tobacco comment: Smoking cessation materials not required  Substance Use Topics  . Alcohol use: No    Alcohol/week: 0.0 standard drinks  . Drug use: No    Review of Systems Constitutional: Negative for fever. Cardiovascular: Negative for chest pain. Respiratory: Negative for shortness of breath.  Denies any cough. Gastrointestinal: Negative for abdominal pain Musculoskeletal: Left hip pain Neurological: Negative for headache All other ROS negative  ____________________________________________   PHYSICAL EXAM:  VITAL SIGNS: ED Triage Vitals  Enc Vitals Group     BP 04/06/19 1118 (!) 178/74     Pulse Rate 04/06/19 1118 (!) 109     Resp  04/06/19 1118 16     Temp 04/06/19 1118 97.6 F (36.4 C)     Temp Source 04/06/19 1118 Oral     SpO2 04/06/19 1118 100 %     Weight 04/06/19 1120 108 lb 14.5 oz (49.4 kg)     Height --      Head Circumference --      Peak Flow --      Pain Score 04/06/19 1119 10     Pain Loc --      Pain Edu? --      Excl. in [place]? --    Constitutional: Alert and oriented. Well appearing and in no distress. Eyes: Normal exam ENT      Head: Normocephalic and atraumatic.      Mouth/Throat: Mucous membranes are moist. Cardiovascular: Normal rate, regular rhythm. Respiratory: Normal respiratory effort without tachypnea nor retractions. Breath sounds are clear  Gastrointestinal: Soft and nontender. No distention.   Musculoskeletal: Left hip pain with palpation.  Left lower extremity is shortened compared to the right lower extremity.  Neurovascular intact distally.  Pain with range of motion. Neurologic:  Normal speech and language. No gross focal neurologic deficits Skin:  Skin is warm, dry and intact.  Psychiatric: Mood and affect are normal.  ____________________________________________    EKG  EKG viewed and interpreted by myself shows a normal sinus rhythm at 65 bpm with a narrow QRS, normal axis nonspecific ST changes with lateral T wave inferolateral inversions.  EKG changes present and prior EKG 06/08/2016.  ____________________________________________    RADIOLOGY  X-ray consistent with subcapital fracture of the left hip. Chest x-ray is negative for acute abnormality.  ____________________________________________   INITIAL IMPRESSION / ASSESSMENT AND PLAN / ED COURSE  Pertinent labs & imaging results that were available during my care of the patient were reviewed by me and considered in my medical decision making (see chart for details).   Patient presents to the emergency department after a fall on her left side.  Examination unfortunately is consistent with likely hip fracture.   Differential would also include pelvis fracture, contusions or dislocation.  We will obtain x-ray imaging as we are not able to view [place] clinic x-rays.  We will check labs, chest x-ray and EKG for preop purposes.  Patient agreeable to plan of care.  Patient is EKG is somewhat atypical however largely unchanged from prior EKG 06/08/2016.  X-ray shows subcapital fracture.  Lab work is largely [place].  Discussed with orthopedics (Dr. Roland Rack) they will operate tomorrow.  We will admit to the medical service.  Edytha Swartzfager was evaluated in Emergency Department on 04/06/2019 for the symptoms described in the history of present illness. She was evaluated in the context of the global COVID-19 pandemic, which necessitated consideration that the patient might be at risk for infection with the SARS-CoV-2 virus that causes COVID-19. Institutional protocols and algorithms that pertain to the evaluation of patients at risk for COVID-19 are in a state of rapid change based on information released by regulatory bodies including the CDC and federal and state organizations. These policies and algorithms were followed during the patient's care in the ED.  ____________________________________________   FINAL CLINICAL IMPRESSION(S) / ED DIAGNOSES  Left hip fracture   Harvest Dark, MD 04/06/19 1410    Harvest Dark, MD 04/06/19 1420

## 2019-04-06 NOTE — Progress Notes (Signed)
Care Alignment Note  Advanced Directives Documents (Living Will, Power of Attorney) currently in the EHR no advanced directives documents available .  Has the patient discussed their wishes with their family/healthcare power of attorney yes. How much does the family or healthcare power of attorney know about their wishes. Patient daughter at bedside understands patient has mild dementia but still functional and able to make her own medical decisions.  Chronic kidney disease.  Now here with mechanical fall with left hip fracture  What does the patient/decision maker understand about their medical condition and the natural course of their disease.  Left hip fracture following mechanical fall.  Mild dementia.  Hypertension.  Chronic kidney disease stage III.  What is the patient/decision maker's biggest fear or concern for the future pain and suffering   What is the most important goal for this patient should their health condition worsen maintenance of function.  Current   Code Status: DNR  Current code status has been reviewed/updated.  Time spent:19 minutes

## 2019-04-06 NOTE — H&P (Signed)
[facility] [place] NAME: Kimberly Abbott    MR#:  [id number]  DATE OF BIRTH:  01-01-1921  DATE OF ADMISSION:  04/06/2019  PRIMARY CARE PHYSICIAN: Patient, No Pcp Per   REQUESTING/REFERRING PHYSICIAN: Harvest Dark  CHIEF COMPLAINT:   Chief Complaint  Patient presents with  . Hip Pain  Mechanical fall with left hip pain  HISTORY OF PRESENT ILLNESS:  Kimberly Abbott  is a 85 y.o. female with a known history of hypertension, chronic kidney disease stage III, gastroesophageal reflux disease, mild dementia and hyperlipidemia who is still very highly functional and lives independently at home but had a mechanical fall at home.  Patient presented with left hip pain.  Fell on the left side of the hip.  Did not hit her head.  No loss of consciousness.  Patient was evaluated in the emergency room and found to have mild leukocytosis with white count of 12.9.  No fevers.  No chest pain.  No shortness of breath.  No pro history of MI.  No prior respiratory issues.  Imaging studies reviewed left hip fracture.  Emergency room provider discussed case with orthopedic physician Dr. Roland Rack who is planning on surgery tomorrow.  Medical service called to admit patient for further evaluation.  PAST MEDICAL HISTORY:   Past Medical History:  Diagnosis Date  . Allergy   . CKD (chronic kidney disease)   . GERD (gastroesophageal reflux disease)   . Hyperlipidemia   . Hypertension     PAST SURGICAL HISTORY:   Past Surgical History:  Procedure Laterality Date  . APPENDECTOMY    . ESOPHAGOGASTRODUODENOSCOPY (EGD) WITH PROPOFOL N/A 03/12/2017   Procedure: ESOPHAGOGASTRODUODENOSCOPY (EGD) WITH PROPOFOL;  Surgeon: Lucilla Lame, MD;  Location: [facility] ENDOSCOPY;  Service: Endoscopy;  Laterality: N/A;  . rectal fistula  in the 40's  . TUBAL LIGATION      SOCIAL HISTORY:   Social History   Tobacco Use  . Smoking status: Former Smoker    Packs/day: 0.25   Years: 40.00    Pack years: 10.00    Types: Cigarettes    Quit date: 1980    Years since quitting: 40.7  . Smokeless tobacco: Never Used  . Tobacco comment: Smoking cessation materials not required  Substance Use Topics  . Alcohol use: No    Alcohol/week: 0.0 standard drinks    FAMILY HISTORY:   Family History  Problem Relation Age of Onset  . Diabetes Mother     DRUG ALLERGIES:   Allergies  Allergen Reactions  . Penicillin G Hives  . Penicillins Hives    Has patient had a PCN reaction causing immediate rash, facial/tongue/throat swelling, SOB or lightheadedness with hypotension: No Has patient had a PCN reaction causing severe rash involving mucus membranes or skin necrosis: No Has patient had a PCN reaction that required hospitalization No Has patient had a PCN reaction occurring within the last 10 years: No If all of the above answers are "NO", then may proceed with Cephalosporin use.    REVIEW OF SYSTEMS:   Review of Systems  Constitutional: Negative for chills and fever.  HENT: Negative for hearing loss and tinnitus.   Eyes: Negative for blurred vision and double vision.  Respiratory: Negative for cough and shortness of breath.   Cardiovascular: Negative for chest pain and palpitations.  Gastrointestinal: Negative for heartburn and nausea.  Genitourinary: Negative for dysuria and urgency.  Musculoskeletal: Negative for myalgias and neck pain.  Skin: Negative for  itching and rash.  Neurological: Negative for dizziness and headaches.  Psychiatric/Behavioral: Negative for depression and hallucinations.    MEDICATIONS AT HOME:   Prior to Admission medications   Medication Sig Start Date End Date Taking? Authorizing Provider  acetaminophen (TYLENOL) 500 MG tablet Take 2 tablets (1,000 mg total) 2 (two) times daily as needed by mouth. 06/12/17   Plonk, Gwyndolyn Saxon, MD  allopurinol (ZYLOPRIM) 100 MG tablet Take 1 tablet (100 mg total) by mouth daily. 10/23/17   Plonk,  Gwyndolyn Saxon, MD  amLODipine (NORVASC) 5 MG tablet Take 5 mg by mouth daily. for high blood pressure 03/17/18   [provider]  aspirin 81 MG chewable tablet Chew 81 mg by mouth daily.     [provider]  betamethasone dipropionate (DIPROLENE) 0.05 % cream Apply topically 2 (two) times daily. 06/20/18   Edrick Kins, DPM  cetirizine (ZYRTEC) 5 MG tablet Take 1 tablet (5 mg total) by mouth daily. 10/23/17   Plonk, Gwyndolyn Saxon, MD  clobetasol ointment (TEMOVATE) AB-123456789 % Apply 1 application topically 2 (two) times daily. 12/11/18   Gardiner Barefoot, DPM  clotrimazole-betamethasone (LOTRISONE) cream APPLY  CREAM TOPICALLY TWICE DAILY 01/26/19   Edrick Kins, DPM  escitalopram (LEXAPRO) 20 MG tablet Take 20 mg by mouth daily. 04/03/19   [provider]  feeding supplement, ENSURE ENLIVE, (ENSURE ENLIVE) LIQD Take 237 mLs by mouth 2 (two) times daily between meals. 06/10/16   Fritzi Mandes, MD  metoprolol tartrate (LOPRESSOR) 25 MG tablet Take 25 mg by mouth daily. for high blood pressure 03/17/18   [provider]  mirtazapine (REMERON) 15 MG tablet Take 1 tablet (15 mg total) by mouth at bedtime. 10/23/17   Plonk, Gwyndolyn Saxon, MD  oxybutynin (DITROPAN-XL) 5 MG 24 hr tablet Take 5 mg by mouth daily. 04/03/19   [provider]  simvastatin (ZOCOR) 20 MG tablet Take 20 mg by mouth daily at 6 PM.  10/17/18   [provider]  triamcinolone ointment (KENALOG) 0.1 % Apply twice a day to affected areas for back. 03/14/15   [provider]      VITAL SIGNS:  Blood pressure (!) 176/73, pulse 70, temperature 97.6 F (36.4 C), temperature source Oral, resp. rate 18, weight 49.4 kg, SpO2 96 %.  PHYSICAL EXAMINATION:  Physical Exam  GENERAL:  85 y.o.-year-old patient lying in the bed with no acute distress.  EYES: Pupils equal, round, reactive to light and accommodation. No scleral icterus. Extraocular muscles intact.  HEENT: Head atraumatic, normocephalic. Oropharynx  and nasopharynx clear.  NECK:  Supple, no jugular venous distention. No thyroid enlargement, no tenderness.  LUNGS: Normal breath sounds bilaterally, no wheezing, rales,rhonchi or crepitation. No use of accessory muscles of respiration.  CARDIOVASCULAR: S1, S2 normal. No murmurs, rubs, or gallops.  ABDOMEN: Soft, nontender, nondistended. Bowel sounds present. No organomegaly or mass.  EXTREMITIES: Limitation of movement of left lower extremity due to pain from left hip fracture.  No pedal edema, cyanosis, or clubbing.  NEUROLOGIC: Moving all extremity apart from left lower extremity due to pain from left hip fracture.  Gait not checked.  PSYCHIATRIC: The patient is alert and oriented x 3.  SKIN: No obvious rash, lesion, or ulcer.   LABORATORY PANEL:   CBC Recent Labs  Lab 04/06/19 1141  WBC 12.9*  HGB 12.3  HCT 38.2  PLT 174   ------------------------------------------------------------------------------------------------------------------  Chemistries  Recent Labs  Lab 04/06/19 1141  NA 138  K 4.3  CL 105  CO2 25  GLUCOSE 117*  BUN 34*  CREATININE 1.26*  CALCIUM 9.7   ------------------------------------------------------------------------------------------------------------------  Cardiac Enzymes No results for input(s): TROPONINI in the last 168 hours. ------------------------------------------------------------------------------------------------------------------  RADIOLOGY:  Dg Chest 1 View  Result Date: 04/06/2019 CLINICAL DATA:  Preoperative radiograph EXAM: CHEST  1 VIEW COMPARISON:  CT angiogram of the chest September 05, 2016 FINDINGS: The cardiac silhouette is mildly enlarged. Mediastinal contours appear intact. Calcific atherosclerotic disease and tortuosity of the aorta There is no evidence of focal airspace consolidation, pleural effusion or pneumothorax. Osseous structures are without acute abnormality. Soft tissues are grossly normal. IMPRESSION: 1. Mildly  enlarged cardiac silhouette. 2. Calcific atherosclerotic disease and tortuosity of the aorta. Electronically Signed   By: Fidela Salisbury M.D.   On: 04/06/2019 12:30   Dg Hip Unilat With Pelvis 2-3 Views Left  Result Date: 04/06/2019 CLINICAL DATA:  Left hip pain after fall today.  Initial encounter. EXAM: DG HIP (WITH OR WITHOUT PELVIS) 2-3V LEFT COMPARISON:  None. FINDINGS: The patient has an acute subcapital fracture of the left hip. No other acute abnormality is identified. Bones are osteopenic. Lower lumbar spondylosis noted. Atherosclerosis is seen. IMPRESSION: Acute subcapital fracture left hip. Electronically Signed   By: Inge Rise M.D.   On: 04/06/2019 12:28      IMPRESSION AND PLAN:  Patient is a 85 year old female with history of hypertension, chronic kidney disease stage III, mild dementia gastroesophageal reflux disease admitted for left hip fracture following mechanical fall at home  1.  Left hip fracture Secondary to mechanical fall at home.  Patient fell on the left side.  Did not hit her head.  No loss of consciousness.  No neurological deficits. Orthopedic surgery already consulted with plans for surgery tomorrow Patient is highly functional and lives independently at home such as at least 4 METS. Noted some diffuse T wave inversion on EKG which appears to be old.  Requested for cardiology input as well. Pain control  2.  Chronic kidney disease stage III Renal function fairly stable  3.  Leukocytosis with white count of 12.9k Most likely reactive.  No fevers. Follow-up on UA.  4.  Mild dementia Patient awake and alert and oriented.  Still able to make her own medical decisions as confirmed by daughter present at bedside.  Patient still lives independently at home. Stable  DVT prophylaxis; SCDs Heparin for drip will be initiated following surgery  All the records are reviewed and case discussed with ED provider. Management plans discussed with the patient,  family and they are in agreement. Updated patient's daughter present at bedside on treatment plans. Patient awake and alert on the ventilator and clearly wishes to be DNR/DNI.  Daughter agrees with her decision  CODE STATUS: DNR/DNI  TOTAL TIME TAKING CARE OF THIS PATIENT: 59 minutes.    Kimberly Abbott M.D on 04/06/2019 at 3:03 PM  Between 7am to 6pm - Pager - [phone]  After 6pm go to [email or web address] - Proofreader  [facility]  Office  [phone]  CC: Primary care physician; Patient, No Pcp Per   Note: This dictation was prepared with Dragon dictation along with smaller phrase technology. Any transcriptional errors that result from this process are unintentional.

## 2019-04-06 NOTE — ED Triage Notes (Signed)
Pt to ED after a fall and confirmed left hip fracture with an Xray at [facility] today.

## 2019-04-06 NOTE — ED Notes (Signed)
Pt unable to be transported at this time due to bed not being in room. 1A charge already placed order for bed. Janett Billow RN states she will notify this RN as soon as bed arrives for pt transport

## 2019-04-07 ENCOUNTER — Encounter
Admission: EM | Disposition: A | Discharge status: Skilled Nursing Facility | Payer: Self-pay | Source: Ambulatory Visit | Attending: Internal Medicine

## 2019-04-07 ENCOUNTER — Inpatient Hospital Stay: Payer: Medicare HMO

## 2019-04-07 ENCOUNTER — Inpatient Hospital Stay: Payer: Medicare HMO | Admitting: Anesthesiology

## 2019-04-07 HISTORY — PX: HIP ARTHROPLASTY: SHX981

## 2019-04-07 LAB — CBC
HCT: 33.8 % — ABNORMAL LOW (ref 36.0–46.0)
Hemoglobin: 11.2 g/dL — ABNORMAL LOW (ref 12.0–15.0)
MCH: 30.6 pg (ref 26.0–34.0)
MCHC: 33.1 g/dL (ref 30.0–36.0)
MCV: 92.3 fL (ref 80.0–100.0)
Platelets: 158 10*3/uL (ref 150–400)
RBC: 3.66 MIL/uL — ABNORMAL LOW (ref 3.87–5.11)
RDW: 13.2 % (ref 11.5–15.5)
WBC: 13.9 10*3/uL — ABNORMAL HIGH (ref 4.0–10.5)
nRBC: 0 % (ref 0.0–0.2)

## 2019-04-07 LAB — BASIC METABOLIC PANEL
Anion gap: 7 (ref 5–15)
BUN: 33 mg/dL — ABNORMAL HIGH (ref 8–23)
CO2: 25 mmol/L (ref 22–32)
Calcium: 9.3 mg/dL (ref 8.9–10.3)
Chloride: 107 mmol/L (ref 98–111)
Creatinine, Ser: 1.26 mg/dL — ABNORMAL HIGH (ref 0.44–1.00)
GFR calc Af Amer: 42 mL/min — ABNORMAL LOW (ref >60)
GFR calc non Af Amer: 36 mL/min — ABNORMAL LOW (ref >60)
Glucose, Bld: 138 mg/dL — ABNORMAL HIGH (ref 70–99)
Potassium: 4.4 mmol/L (ref 3.5–5.1)
Sodium: 139 mmol/L (ref 135–145)

## 2019-04-07 LAB — MAGNESIUM: Magnesium: 2 mg/dL (ref 1.7–2.4)

## 2019-04-07 SURGERY — HEMIARTHROPLASTY, HIP, DIRECT ANTERIOR APPROACH, FOR FRACTURE
Anesthesia: Spinal | Site: Hip | Laterality: Left

## 2019-04-07 MED ORDER — MIDAZOLAM HCL 2 MG/2ML IJ SOLN
INTRAMUSCULAR | Status: AC
  Filled 2019-04-07: qty 2

## 2019-04-07 MED ORDER — PROPOFOL 10 MG/ML IV BOLUS
INTRAVENOUS | Status: AC
  Filled 2019-04-07: qty 20

## 2019-04-07 MED ORDER — BISACODYL 10 MG RE SUPP: 10.0000 mg | Freq: Every day | RECTAL | Status: DC | PRN

## 2019-04-07 MED ORDER — PHENYLEPHRINE HCL (PRESSORS) 10 MG/ML IV SOLN
INTRAVENOUS | Status: DC | PRN
  Administered 2019-04-07 (×2): 50 ug via INTRAVENOUS
  Administered 2019-04-07: 100 ug via INTRAVENOUS

## 2019-04-07 MED ORDER — MAGNESIUM HYDROXIDE 400 MG/5ML PO SUSP
30.0000 mL | Freq: Every day | ORAL | Status: DC | PRN
  Administered 2019-04-10: 30 mL via ORAL
  Filled 2019-04-07: qty 30

## 2019-04-07 MED ORDER — KETAMINE HCL 50 MG/ML IJ SOLN
INTRAMUSCULAR | Status: AC
  Filled 2019-04-07: qty 10

## 2019-04-07 MED ORDER — TRAMADOL HCL 50 MG PO TABS
50.0000 mg | ORAL_TABLET | Freq: Four times a day (QID) | ORAL | Status: DC | PRN
  Administered 2019-04-08: 50 mg via ORAL
  Filled 2019-04-07: qty 1

## 2019-04-07 MED ORDER — METOCLOPRAMIDE HCL 10 MG PO TABS: 5.0000 mg | ORAL_TABLET | Freq: Three times a day (TID) | ORAL | Status: DC | PRN

## 2019-04-07 MED ORDER — SODIUM CHLORIDE 0.9 % IV SOLN
INTRAVENOUS | Status: DC | PRN
  Administered 2019-04-07: 60 mL

## 2019-04-07 MED ORDER — ENOXAPARIN SODIUM 30 MG/0.3ML ~~LOC~~ SOLN
30.0000 mg | SUBCUTANEOUS | Status: DC
  Administered 2019-04-08 – 2019-04-10 (×3): 30 mg via SUBCUTANEOUS
  Filled 2019-04-07 (×3): qty 0.3

## 2019-04-07 MED ORDER — CLINDAMYCIN PHOSPHATE 600 MG/50ML IV SOLN
INTRAVENOUS | Status: AC
  Filled 2019-04-07: qty 50

## 2019-04-07 MED ORDER — ACETAMINOPHEN 325 MG PO TABS: 325.0000 mg | ORAL_TABLET | Freq: Four times a day (QID) | ORAL | Status: DC | PRN

## 2019-04-07 MED ORDER — PROPOFOL 500 MG/50ML IV EMUL
INTRAVENOUS | Status: DC | PRN
  Administered 2019-04-07: 20 ug/kg/min via INTRAVENOUS

## 2019-04-07 MED ORDER — KETAMINE HCL 10 MG/ML IJ SOLN
INTRAMUSCULAR | Status: DC | PRN
  Administered 2019-04-07: 10 mg via INTRAVENOUS

## 2019-04-07 MED ORDER — BUPIVACAINE HCL (PF) 0.5 % IJ SOLN
INTRAMUSCULAR | Status: DC | PRN
  Administered 2019-04-07: 3 mL

## 2019-04-07 MED ORDER — PHENYLEPHRINE HCL (PRESSORS) 10 MG/ML IV SOLN
INTRAVENOUS | Status: AC
  Filled 2019-04-07: qty 1

## 2019-04-07 MED ORDER — ONDANSETRON HCL 4 MG/2ML IJ SOLN: 4.0000 mg | Freq: Four times a day (QID) | INTRAMUSCULAR | Status: DC | PRN

## 2019-04-07 MED ORDER — ACETAMINOPHEN 500 MG PO TABS
1000.0000 mg | ORAL_TABLET | Freq: Four times a day (QID) | ORAL | Status: DC
  Filled 2019-04-07: qty 2

## 2019-04-07 MED ORDER — ACETAMINOPHEN 10 MG/ML IV SOLN
1000.0000 mg | Freq: Three times a day (TID) | INTRAVENOUS | Status: AC
  Administered 2019-04-08 (×3): 1000 mg via INTRAVENOUS
  Filled 2019-04-07 (×3): qty 100

## 2019-04-07 MED ORDER — DOCUSATE SODIUM 100 MG PO CAPS
100.0000 mg | ORAL_CAPSULE | Freq: Two times a day (BID) | ORAL | Status: DC
  Administered 2019-04-08 – 2019-04-10 (×3): 100 mg via ORAL
  Filled 2019-04-07 (×5): qty 1

## 2019-04-07 MED ORDER — ONDANSETRON HCL 4 MG/2ML IJ SOLN: 4.0000 mg | Freq: Once | INTRAMUSCULAR | Status: DC | PRN

## 2019-04-07 MED ORDER — FENTANYL CITRATE (PF) 100 MCG/2ML IJ SOLN: 25.0000 ug | INTRAMUSCULAR | Status: DC | PRN

## 2019-04-07 MED ORDER — LACTATED RINGERS IV SOLN
INTRAVENOUS | Status: DC
  Administered 2019-04-07: 16:00:00 via INTRAVENOUS

## 2019-04-07 MED ORDER — CIPROFLOXACIN HCL 500 MG PO TABS
250.0000 mg | ORAL_TABLET | Freq: Two times a day (BID) | ORAL | Status: DC
  Administered 2019-04-08 – 2019-04-09 (×3): 250 mg via ORAL
  Filled 2019-04-07 (×5): qty 0.5

## 2019-04-07 MED ORDER — MIDAZOLAM HCL 5 MG/5ML IJ SOLN
INTRAMUSCULAR | Status: DC | PRN
  Administered 2019-04-07: 0.5 mg via INTRAVENOUS

## 2019-04-07 MED ORDER — CLINDAMYCIN PHOSPHATE 600 MG/50ML IV SOLN
INTRAVENOUS | Status: DC | PRN
  Administered 2019-04-07: 600 mg via INTRAVENOUS

## 2019-04-07 MED ORDER — SODIUM CHLORIDE 0.9 % IV SOLN
INTRAVENOUS | Status: DC
  Administered 2019-04-07 – 2019-04-09 (×2): via INTRAVENOUS

## 2019-04-07 MED ORDER — METOCLOPRAMIDE HCL 5 MG/ML IJ SOLN: 5.0000 mg | Freq: Three times a day (TID) | INTRAMUSCULAR | Status: DC | PRN

## 2019-04-07 MED ORDER — CLINDAMYCIN PHOSPHATE 600 MG/50ML IV SOLN
600.0000 mg | Freq: Four times a day (QID) | INTRAVENOUS | Status: AC
  Administered 2019-04-07 – 2019-04-08 (×3): 600 mg via INTRAVENOUS
  Filled 2019-04-07 (×3): qty 50

## 2019-04-07 MED ORDER — FLEET ENEMA 7-19 GM/118ML RE ENEM: 1.0000 | ENEMA | Freq: Once | RECTAL | Status: DC | PRN

## 2019-04-07 MED ORDER — DIPHENHYDRAMINE HCL 12.5 MG/5ML PO ELIX: 12.5000 mg | ORAL_SOLUTION | ORAL | Status: DC | PRN

## 2019-04-07 MED ORDER — BUPIVACAINE-EPINEPHRINE (PF) 0.5% -1:200000 IJ SOLN
INTRAMUSCULAR | Status: DC | PRN
  Administered 2019-04-07: 30 mL

## 2019-04-07 MED ORDER — ONDANSETRON HCL 4 MG PO TABS: 4.0000 mg | ORAL_TABLET | Freq: Four times a day (QID) | ORAL | Status: DC | PRN

## 2019-04-07 MED ORDER — OXYCODONE HCL 5 MG PO TABS
5.0000 mg | ORAL_TABLET | ORAL | Status: DC | PRN
  Administered 2019-04-09: 5 mg via ORAL

## 2019-04-07 SURGICAL SUPPLY — 64 items
BAG DECANTER FOR FLEXI CONT (MISCELLANEOUS) IMPLANT
BLADE SAGITTAL WIDE XTHICK NO (BLADE) ×2 IMPLANT
BLADE SURG SZ20 CARB STEEL (BLADE) ×2 IMPLANT
BNDG COHESIVE 6X5 TAN STRL LF (GAUZE/BANDAGES/DRESSINGS) ×2 IMPLANT
BOWL CEMENT MIXING ADV NOZZLE (MISCELLANEOUS) IMPLANT
CANISTER SUCT 1200ML W/VALVE (MISCELLANEOUS) ×2 IMPLANT
CANISTER SUCT 3000ML PPV (MISCELLANEOUS) ×4 IMPLANT
CHLORAPREP W/TINT 26 (MISCELLANEOUS) ×4 IMPLANT
COVER WAND RF STERILE (DRAPES) ×2 IMPLANT
DECANTER SPIKE VIAL GLASS SM (MISCELLANEOUS) ×4 IMPLANT
DRAPE 3/4 80X56 (DRAPES) ×2 IMPLANT
DRAPE INCISE IOBAN 66X60 STRL (DRAPES) ×2 IMPLANT
DRAPE SPLIT 6X30 W/TAPE (DRAPES) ×2 IMPLANT
DRAPE SURG 17X11 SM STRL (DRAPES) ×2 IMPLANT
DRAPE SURG 17X23 STRL (DRAPES) ×2 IMPLANT
DRSG OPSITE POSTOP 4X12 (GAUZE/BANDAGES/DRESSINGS) ×2 IMPLANT
DRSG OPSITE POSTOP 4X14 (GAUZE/BANDAGES/DRESSINGS) ×1 IMPLANT
ELECT BLADE 6.5 EXT (BLADE) ×2 IMPLANT
ELECT CAUTERY BLADE 6.4 (BLADE) ×2 IMPLANT
ELECT REM PT RETURN 9FT ADLT (ELECTROSURGICAL) ×2
ELECTRODE REM PT RTRN 9FT ADLT (ELECTROSURGICAL) ×1 IMPLANT
GAUZE PACK 2X3YD (GAUZE/BANDAGES/DRESSINGS) IMPLANT
GLOVE BIO SURGEON STRL SZ8 (GLOVE) ×4 IMPLANT
GLOVE BIOGEL M STRL SZ7.5 (GLOVE) IMPLANT
GLOVE BIOGEL PI IND STRL 8 (GLOVE) IMPLANT
GLOVE BIOGEL PI INDICATOR 8 (GLOVE)
GLOVE INDICATOR 8.0 STRL GRN (GLOVE) ×2 IMPLANT
GOWN STRL REUS W/ TWL LRG LVL3 (GOWN DISPOSABLE) ×1 IMPLANT
GOWN STRL REUS W/ TWL XL LVL3 (GOWN DISPOSABLE) ×1 IMPLANT
GOWN STRL REUS W/TWL LRG LVL3 (GOWN DISPOSABLE) ×1
GOWN STRL REUS W/TWL XL LVL3 (GOWN DISPOSABLE) ×1
HEAD ENDO II MOD SZ 46 (Orthopedic Implant) ×1 IMPLANT
HOOD PEEL AWAY FLYTE STAYCOOL (MISCELLANEOUS) ×4 IMPLANT
INSERT TAPER ENDO II -6 (Orthopedic Implant) ×1 IMPLANT
IV NS 100ML SINGLE PACK (IV SOLUTION) IMPLANT
LABEL OR SOLS (LABEL) ×2 IMPLANT
NDL FILTER BLUNT 18X1 1/2 (NEEDLE) ×1 IMPLANT
NDL SAFETY ECLIPSE 18X1.5 (NEEDLE) ×1 IMPLANT
NDL SPNL 20GX3.5 QUINCKE YW (NEEDLE) ×1 IMPLANT
NEEDLE FILTER BLUNT 18X 1/2SAF (NEEDLE) ×1
NEEDLE FILTER BLUNT 18X1 1/2 (NEEDLE) ×1 IMPLANT
NEEDLE HYPO 18GX1.5 SHARP (NEEDLE) ×1
NEEDLE SPNL 20GX3.5 QUINCKE YW (NEEDLE) ×2 IMPLANT
NS IRRIG 1000ML POUR BTL (IV SOLUTION) ×2 IMPLANT
PACK HIP PROSTHESIS (MISCELLANEOUS) ×2 IMPLANT
PULSAVAC PLUS IRRIG FAN TIP (DISPOSABLE) ×2
SOL .9 NS 3000ML IRR  AL (IV SOLUTION) ×2
SOL .9 NS 3000ML IRR UROMATIC (IV SOLUTION) ×2 IMPLANT
STAPLER SKIN PROX 35W (STAPLE) ×2 IMPLANT
STEM FEM COLLARLESS 14X150 (Stem) ×1 IMPLANT
STRAP SAFETY 5IN WIDE (MISCELLANEOUS) ×2 IMPLANT
SUT ETHIBOND 2 V 37 (SUTURE) ×6 IMPLANT
SUT VIC AB 1 CT1 36 (SUTURE) ×4 IMPLANT
SUT VIC AB 2-0 CT1 (SUTURE) ×6 IMPLANT
SUT VIC AB 2-0 CT1 27 (SUTURE) ×3
SUT VIC AB 2-0 CT1 TAPERPNT 27 (SUTURE) ×3 IMPLANT
SUT VICRYL 1-0 27IN ABS (SUTURE) ×4
SUTURE VICRYL 1-0 27IN ABS (SUTURE) ×2 IMPLANT
SYR 10ML LL (SYRINGE) ×2 IMPLANT
SYR 30ML LL (SYRINGE) ×6 IMPLANT
SYR TB 1ML 27GX1/2 LL (SYRINGE) IMPLANT
TAPE TRANSPORE STRL 2 31045 (GAUZE/BANDAGES/DRESSINGS) ×2 IMPLANT
TIP BRUSH PULSAVAC PLUS 24.33 (MISCELLANEOUS) ×2 IMPLANT
TIP FAN IRRIG PULSAVAC PLUS (DISPOSABLE) ×1 IMPLANT

## 2019-04-07 NOTE — Anesthesia Post-op Follow-up Note (Signed)
Anesthesia QCDR form completed.        

## 2019-04-07 NOTE — NC FL2 (Addendum)
[place] LEVEL OF CARE SCREENING TOOL     IDENTIFICATION  Patient Name: Kimberly Abbott Birthdate: 09-Aug-1920 Sex: female Admission Date (Current Location): 04/06/2019  [place] and [place] Number:  Engineering geologist and Address:  [facility], [address] [place], [place] [address]      Provider Number: [id number]  Attending Physician Name and Address:  Nicholes Mango, MD  Relative Name and Phone Number:       Current Level of Care: Hospital Recommended Level of Care: [place] Prior Approval Number:    Date Approved/Denied:   PASRR Number: [id number]  Discharge Plan: SNF    Current Diagnoses: Patient Active Problem List   Diagnosis Date Noted  . Closed left hip fracture ([place]) 04/06/2019  . Clostridium difficile colitis 04/30/2017  . Insomnia 04/24/2017  . Problems with swallowing and mastication   . SBO (small bowel obstruction) ([place]) 03/06/2017  . History of esophageal stricture 01/29/2017  . Hiatal hernia 01/29/2017  . Dementia ([place]) 01/29/2017  . Closed dislocation of tarsometatarsal joint 01/29/2017  . Vitamin D deficiency 01/29/2017  . Gait instability 01/28/2017  . Gout 01/28/2017  . Unintentional weight loss 01/28/2017  . Primary osteoarthritis of both hands 05/03/2016  . Chronic fatigue 09/01/2015  . Rotator cuff impingement syndrome of left shoulder 09/01/2015  . Pustular psoriasis of palms and soles 05/05/2015  . Chronic kidney disease, stage III (moderate) ([place]) 02/10/2015  . Allergic rhinitis 12/31/2014  . Dermatitis, eczematoid 12/31/2014  . Hyperlipidemia 11/08/2014  . Anemia 11/08/2014  . Recurrent UTI 11/08/2014  . Depression 11/08/2014  . Gastroesophageal reflux disease without esophagitis 11/08/2014  . Bladder instability 11/08/2014  . At moderate risk for fall 11/08/2014  . Dysphagia 11/08/2014  . Rotator cuff syndrome of right shoulder 11/08/2014  . Essential hypertension  11/08/2014    Orientation RESPIRATION BLADDER Height & Weight     Self, Time, Situation, Place  Normal Continent Weight: 108 lb 14.5 oz (49.4 kg) Height:  5' (152.4 cm)  BEHAVIORAL SYMPTOMS/MOOD NEUROLOGICAL BOWEL NUTRITION STATUS      Continent  Diet: Dysphagia level 2/3 foods w/ Thin liquids - NO STRAWS(to avoid swallowingextra air); strict REFLUX precautions; general aspiration precautions. Tray setup.    AMBULATORY STATUS COMMUNICATION OF NEEDS Skin   Extensive Assist Verbally Surgical wounds                       Personal Care Assistance Level of Assistance  Bathing, Feeding, Dressing Bathing Assistance: Limited assistance Feeding assistance: Independent Dressing Assistance: Limited assistance     Functional Limitations Info  Sight, Hearing, Speech Sight Info: Adequate Hearing Info: Adequate Speech Info: Adequate    SPECIAL CARE FACTORS FREQUENCY  PT (By licensed PT), OT (By licensed OT)     PT Frequency: 5 OT Frequency: 5            Contractures      Additional Factors Info  Code Status, Allergies Code Status Info: DNR Allergies Info: Penicillin G, Penicillins           Current Medications (04/07/2019):  This is the current hospital active medication list Current Facility-Administered Medications  Medication Dose Route Frequency Provider Last Rate Last Dose  . acetaminophen (TYLENOL) tablet 1,000 mg  1,000 mg Oral Q6H PRN Ojie, Jude, MD      . allopurinol (ZYLOPRIM) tablet 100 mg  100 mg Oral Daily Ojie, Jude, MD      . amLODipine (NORVASC) tablet 5  mg  5 mg Oral Daily Ojie, Jude, MD   5 mg at 04/06/19 1748  . ciprofloxacin (CIPRO) tablet 250 mg  250 mg Oral BID Gouru, Aruna, MD      . clindamycin (CLEOCIN) IVPB 600 mg  600 mg Intravenous Once Poggi, Marshall Cork, MD      . clobetasol ointment (TEMOVATE) [id number] % 1 application  1 application Topical BID Ojie, Jude, MD      . clotrimazole (LOTRIMIN) 1 % cream   Topical BID PRN Ojie, Jude, MD      .  escitalopram (LEXAPRO) tablet 20 mg  20 mg Oral Daily Ojie, Jude, MD      . feeding supplement (ENSURE ENLIVE) (ENSURE ENLIVE) liquid 237 mL  237 mL Oral BID BM Ojie, Jude, MD      . lactated ringers infusion   Intravenous Continuous Gunnar Fusi, MD 10 mL/hr at 04/07/19 1559    . mirtazapine (REMERON) tablet 15 mg  15 mg Oral QHS Ojie, Jude, MD   15 mg at 04/06/19 2034  . morphine 2 MG/ML injection 2 mg  2 mg Intravenous Q4H PRN Stark Jock, Jude, MD   2 mg at 04/06/19 1747  . oxybutynin (DITROPAN-XL) 24 hr tablet 5 mg  5 mg Oral Daily Ojie, Jude, MD   5 mg at 04/06/19 1749  . simvastatin (ZOCOR) tablet 20 mg  20 mg Oral q1800 Stark Jock, Jude, MD   20 mg at 04/06/19 1748  . triamcinolone ointment (KENALOG) 0.1 %   Topical BID Stark Jock, Jude, MD         Discharge Medications: Please see discharge summary for a list of discharge medications.  Relevant Imaging Results:  Relevant Lab Results:   Additional Information SSN: [id number]  Kaipo Ardis, Veronia Beets, LCSW

## 2019-04-07 NOTE — Anesthesia Preprocedure Evaluation (Addendum)
Anesthesia Evaluation  Patient identified by MRN, date of birth, ID band Patient awake    Reviewed: Allergy & Precautions, H&P , NPO status , Patient's Chart, lab work & pertinent test results  Airway Mallampati: II       Dental  (+) Missing, Partial Lower, Partial Upper   Pulmonary neg pulmonary ROS, neg sleep apnea, neg COPD, Not current smoker, former smoker,           Cardiovascular Exercise Tolerance: Poor hypertension, Pt. on medications (-) Past MI and (-) CHF negative cardio ROS Normal cardiovascular exam(-) dysrhythmias (-) Cardiac Defibrillator      Neuro/Psych neg Seizures Depression Dementia    GI/Hepatic negative GI ROS, Neg liver ROS, hiatal hernia, neg GERD  ,  Endo/Other  neg diabetes  Renal/GU Renal InsufficiencyRenal disease  negative genitourinary   Musculoskeletal   Abdominal   Peds  Hematology  (+) anemia ,   Anesthesia Other Findings     Reproductive/Obstetrics negative OB ROS                            Anesthesia Physical Anesthesia Plan  ASA: III  Anesthesia Plan: Spinal   Post-op Pain Management:    Induction:   PONV Risk Score and Plan:   Airway Management Planned:   Additional Equipment:   Intra-op Plan:   Post-operative Plan:   Informed Consent: I have reviewed the patients History and Physical, chart, labs and discussed the procedure including the risks, benefits and alternatives for the proposed anesthesia with the patient or authorized representative who has indicated his/her understanding and acceptance.       Plan Discussed with: CRNA  Anesthesia Plan Comments:        Anesthesia Quick Evaluation

## 2019-04-07 NOTE — Consult Note (Signed)
Reason for Consult: Preop clearance Referring Physician: Dr. Stark Jock hospitalist  Kimberly Abbott is an 85 y.o. female.  HPI: Presents after a fall at home with a fractured hip.  Patient history of dementia and lives alone and suffered a left hip fracture and now preop for procedure preop EKG suggested mild ST-T wave changes which appeared to be chronic and non-ischemic in my estimation patient history of GERD and chronic renal sufficiency denies any chest pain but relatively lethargic after medications to help with pain.  Patient denies any previous cardiac history daughter is in the room providing much of the history  Past Medical History:  Diagnosis Date  . Allergy   . CKD (chronic kidney disease)   . GERD (gastroesophageal reflux disease)   . Hyperlipidemia   . Hypertension     Past Surgical History:  Procedure Laterality Date  . APPENDECTOMY    . ESOPHAGOGASTRODUODENOSCOPY (EGD) WITH PROPOFOL N/A 03/12/2017   Procedure: ESOPHAGOGASTRODUODENOSCOPY (EGD) WITH PROPOFOL;  Surgeon: Lucilla Lame, MD;  Location[facility] ENDOSCOPY;  Service: Endoscopy;  Laterality: N/A;  . rectal fistula  in the 40's  . TUBAL LIGATION      Family History  Problem Relation Age of Onset  . Diabetes Mother     Social History:  reports that she quit smoking about 40 years ago. Her smoking use included cigarettes. She has a 10.00 pack-year smoking history. She has never used smokeless tobacco. She reports that she does not drink alcohol or use drugs.  Allergies:  Allergies  Allergen Reactions  . Penicillin G Hives  . Penicillins Hives    Has patient had a PCN reaction causing immediate rash, facial/tongue/throat swelling, SOB or lightheadedness with hypotension: No Has patient had a PCN reaction causing severe rash involving mucus membranes or skin necrosis: No Has patient had a PCN reaction that required hospitalization No Has patient had a PCN reaction occurring within the last 10 years: No If all of the  above answers are "NO", then may proceed with Cephalosporin use.    Medications: I have reviewed the patient's current medications.  Results for orders placed or performed during the hospital encounter of 04/06/19 (from the past 48 hour(s))  CBC     Status: Abnormal   Collection Time: 04/06/19 11:41 AM  Result Value Ref Range   WBC 12.9 (H) 4.0 - 10.5 K/uL   RBC 4.14 3.87 - 5.11 MIL/uL   Hemoglobin 12.3 12.0 - 15.0 g/dL   HCT 38.2 36.0 - 46.0 %   MCV 92.3 80.0 - 100.0 fL   MCH 29.7 26.0 - 34.0 pg   MCHC 32.2 30.0 - 36.0 g/dL   RDW 13.2 11.5 - 15.5 %   Platelets 174 150 - 400 K/uL   nRBC 0.2 0.0 - 0.2 %    Comment: Performed at [facility], [address] [place], [place] [id number]  Basic metabolic panel     Status: Abnormal   Collection Time: 04/06/19 11:41 AM  Result Value Ref Range   Sodium 138 135 - 145 mmol/L   Potassium 4.3 3.5 - 5.1 mmol/L   Chloride 105 98 - 111 mmol/L   CO2 25 22 - 32 mmol/L   Glucose, Bld 117 (H) 70 - 99 mg/dL   BUN 34 (H) 8 - 23 mg/dL   Creatinine, Ser 1.26 (H) 0.44 - 1.00 mg/dL   Calcium 9.7 8.9 - 10.3 mg/dL   GFR calc non Af Amer 36 (L) >60 mL/min   GFR calc Af Wyvonnia Lora  42 (L) >60 mL/min   Anion gap 8 5 - 15    Comment: Performed at [facility], [address] [place], [place] [address]  SARS Coronavirus 2 [facility] order, Performed in [facility] hospital lab) Nasopharyngeal Nasal Mucosa     Status: None   Collection Time: 04/06/19 11:41 AM   Specimen: Nasal Mucosa; Nasopharyngeal  Result Value Ref Range   SARS Coronavirus 2 NEGATIVE NEGATIVE    Comment: (NOTE) If result is NEGATIVE SARS-CoV-2 target nucleic acids are NOT DETECTED. The SARS-CoV-2 RNA is generally detectable in upper and lower  respiratory specimens during the acute phase of infection. The lowest  concentration of SARS-CoV-2 viral copies this assay can detect is 250  copies / mL. A negative result does not preclude SARS-CoV-2 infection  and should not be  used as the sole basis for treatment or other  patient management decisions.  A negative result may occur with  improper specimen collection / handling, submission of specimen other  than nasopharyngeal swab, presence of viral mutation(s) within the  areas targeted by this assay, and inadequate number of viral copies  (<250 copies / mL). A negative result must be combined with clinical  observations, patient history, and epidemiological information. If result is POSITIVE SARS-CoV-2 target nucleic acids are DETECTED. The SARS-CoV-2 RNA is generally detectable in upper and lower  respiratory specimens dur ing the acute phase of infection.  Positive  results are indicative of active infection with SARS-CoV-2.  Clinical  correlation with patient history and other diagnostic information is  necessary to determine patient infection status.  Positive results do  not rule out bacterial infection or co-infection with other viruses. If result is PRESUMPTIVE POSTIVE SARS-CoV-2 nucleic acids MAY BE PRESENT.   A presumptive positive result was obtained on the submitted specimen  and confirmed on repeat testing.  While 2019 novel coronavirus  (SARS-CoV-2) nucleic acids may be present in the submitted sample  additional confirmatory testing may be necessary for epidemiological  and / or clinical management purposes  to differentiate between  SARS-CoV-2 and other Sarbecovirus currently known to infect humans.  If clinically indicated additional testing with an alternate test  methodology [phone]) is advised. The SARS-CoV-2 RNA is generally  detectable in upper and lower respiratory sp ecimens during the acute  phase of infection. The expected result is Negative. Fact Sheet for Patients:  StrictlyIdeas.no Fact Sheet for Healthcare Providers: BankingDealers.co.za This test is not yet approved or cleared by the [place] FDA and has been authorized  for detection and/or diagnosis of SARS-CoV-2 by FDA under an Emergency Use Authorization (EUA).  This EUA will remain in effect (meaning this test can be used) for the duration of the COVID-19 declaration under Section 564(b)(1) of the Act, 21 U.S.C. section 360bbb-3(b)(1), unless the authorization is terminated or revoked sooner. Performed at [facility], [address] [place], [place] [address]   Urinalysis, Routine w reflex microscopic     Status: Abnormal   Collection Time: 04/06/19  8:21 PM  Result Value Ref Range   Color, Urine YELLOW (A) YELLOW   APPearance HAZY (A) CLEAR   Specific Gravity, Urine 1.014 1.005 - 1.030   pH 5.0 5.0 - 8.0   Glucose, UA NEGATIVE NEGATIVE mg/dL   Hgb urine dipstick NEGATIVE NEGATIVE   Bilirubin Urine NEGATIVE NEGATIVE   Ketones, ur 5 (A) NEGATIVE mg/dL   Protein, ur NEGATIVE NEGATIVE mg/dL   Nitrite POSITIVE (A) NEGATIVE   Leukocytes,Ua LARGE (A) NEGATIVE  RBC / HPF 0-5 0 - 5 RBC/hpf   WBC, UA 21-50 0 - 5 WBC/hpf   Bacteria, UA RARE (A) NONE SEEN   Squamous Epithelial / LPF 0-5 0 - 5    Comment: Performed at [facility], [address], [place], [place] [address]  Surgical pcr screen     Status: None   Collection Time: 04/06/19  8:21 PM   Specimen: Nasal Mucosa; Nasal Swab  Result Value Ref Range   MRSA, PCR NEGATIVE NEGATIVE   Staphylococcus aureus NEGATIVE NEGATIVE    Comment: (NOTE) The Xpert SA Assay (FDA approved for NASAL specimens in patients 68 years of age and older), is one component of a comprehensive surveillance program. It is not intended to diagnose infection nor to guide or monitor treatment. Performed at [facility], [address] [place], [place] [id number]   Basic metabolic panel     Status: Abnormal   Collection Time: 04/07/19  4:13 AM  Result Value Ref Range   Sodium 139 135 - 145 mmol/L   Potassium 4.4 3.5 - 5.1 mmol/L   Chloride 107 98 - 111 mmol/L   CO2 25 22 - 32 mmol/L    Glucose, Bld 138 (H) 70 - 99 mg/dL   BUN 33 (H) 8 - 23 mg/dL   Creatinine, Ser 1.26 (H) 0.44 - 1.00 mg/dL   Calcium 9.3 8.9 - 10.3 mg/dL   GFR calc non Af Amer 36 (L) >60 mL/min   GFR calc Af Amer 42 (L) >60 mL/min   Anion gap 7 5 - 15    Comment: Performed at [facility], [address] [place], [place] [address]  CBC     Status: Abnormal   Collection Time: 04/07/19  4:13 AM  Result Value Ref Range   WBC 13.9 (H) 4.0 - 10.5 K/uL   RBC 3.66 (L) 3.87 - 5.11 MIL/uL   Hemoglobin 11.2 (L) 12.0 - 15.0 g/dL   HCT 33.8 (L) 36.0 - 46.0 %   MCV 92.3 80.0 - 100.0 fL   MCH 30.6 26.0 - 34.0 pg   MCHC 33.1 30.0 - 36.0 g/dL   RDW 13.2 11.5 - 15.5 %   Platelets 158 150 - 400 K/uL   nRBC 0.0 0.0 - 0.2 %    Comment: Performed at [facility], [address] [place], [place] [address]  Magnesium     Status: None   Collection Time: 04/07/19  4:13 AM  Result Value Ref Range   Magnesium 2.0 1.7 - 2.4 mg/dL    Comment: Performed at [facility], [address] [place], [place] [address]    Dg Chest 1 View  Result Date: 04/06/2019 CLINICAL DATA:  Preoperative radiograph EXAM: CHEST  1 VIEW COMPARISON:  CT angiogram of the chest September 05, 2016 FINDINGS: The cardiac silhouette is mildly enlarged. Mediastinal contours appear intact. Calcific atherosclerotic disease and tortuosity of the aorta There is no evidence of focal airspace consolidation, pleural effusion or pneumothorax. Osseous structures are without acute abnormality. Soft tissues are grossly normal. IMPRESSION: 1. Mildly enlarged cardiac silhouette. 2. Calcific atherosclerotic disease and tortuosity of the aorta. Electronically Signed   By: Fidela Salisbury M.D.   On: 04/06/2019 12:30   Dg Hip Unilat With Pelvis 2-3 Views Left  Result Date: 04/06/2019 CLINICAL DATA:  Left hip pain after fall today.  Initial encounter. EXAM: DG HIP (WITH OR WITHOUT PELVIS) 2-3V LEFT COMPARISON:  None. FINDINGS: The patient has  an acute  subcapital fracture of the left hip. No other acute abnormality is identified. Bones are osteopenic. Lower lumbar spondylosis noted. Atherosclerosis is seen. IMPRESSION: Acute subcapital fracture left hip. Electronically Signed   By: Inge Rise M.D.   On: 04/06/2019 12:28    Review of Systems  Unable to perform ROS: Medical condition   Blood pressure (!) 174/72, pulse 76, temperature 97.8 F (36.6 C), resp. rate 16, height 5' (1.524 m), weight 49.4 kg, SpO2 98 %. Physical Exam  Nursing note and vitals reviewed. Constitutional: She appears well-developed and well-nourished. She appears lethargic.  HENT:  Head: Normocephalic and atraumatic.  Eyes: EOM are normal.  Neck: Normal range of motion. Neck supple.  Cardiovascular: Normal rate and regular rhythm.  Murmur heard. Respiratory: Effort normal and breath sounds normal.  GI: Soft. Bowel sounds are normal.  Musculoskeletal:        General: Deformity present.  Neurological: She appears lethargic.  Skin: Skin is warm and dry.  Left hip fracture with deformity  Assessment/Plan: Preop clearance for hip Abnormal EKG GERD Chronic renal insufficiency stage III Mild dementia Leukocytosis Recent fall . Plan Patient appears to be an acceptable goal mild to moderate risk for hip surgery Would recommend proceeding with correcting hip surgery with minimal cardiac intervention to modify her risk Continue DVT prophylaxis Consider follow-up with nephrology for renal insufficiency We will continue to follow-up post procedure   D  04/07/2019, 3:43 PM

## 2019-04-07 NOTE — Progress Notes (Addendum)
[facility] NAME: Kimberly Abbott    MR#:  [id number]  DATE OF BIRTH:  Aug 29, 1920  SUBJECTIVE:  CHIEF COMPLAINT: Patient is resting comfortably.  Hip hurts when she moves.  Otherwise she is doing fine.  N.p.o.  REVIEW OF SYSTEMS:  CONSTITUTIONAL: No fever, fatigue or weakness.  EYES: No blurred or double vision.  EARS, NOSE, AND THROAT: No tinnitus or ear pain.  RESPIRATORY: No cough, shortness of breath, wheezing or hemoptysis.  CARDIOVASCULAR: No chest pain, orthopnea, edema.  GASTROINTESTINAL: No nausea, vomiting, diarrhea or abdominal pain.  GENITOURINARY: No dysuria, hematuria.  ENDOCRINE: No polyuria, nocturia,  HEMATOLOGY: No anemia, easy bruising or bleeding SKIN: No rash or lesion. MUSCULOSKELETAL: Left hip pain  nEUROLOGIC: No tingling, numbness, weakness.  PSYCHIATRY: No anxiety or depression.   DRUG ALLERGIES:   Allergies  Allergen Reactions  . Penicillin G Hives  . Penicillins Hives    Has patient had a PCN reaction causing immediate rash, facial/tongue/throat swelling, SOB or lightheadedness with hypotension: No Has patient had a PCN reaction causing severe rash involving mucus membranes or skin necrosis: No Has patient had a PCN reaction that required hospitalization No Has patient had a PCN reaction occurring within the last 10 years: No If all of the above answers are "NO", then may proceed with Cephalosporin use.    VITALS:  Blood pressure (!) 174/72, pulse 76, temperature 97.8 F (36.6 C), resp. rate 16, height 5' (1.524 m), weight 49.4 kg, SpO2 98 %.  PHYSICAL EXAMINATION:  GENERAL:  85 y.o.-year-old patient lying in the bed with no acute distress.  EYES: Pupils equal, round, reactive to light and accommodation. No scleral icterus. Extraocular muscles intact.  HEENT: Head atraumatic, normocephalic. Oropharynx and nasopharynx clear.  NECK:  Supple, no jugular venous distention. No thyroid  enlargement, no tenderness.  LUNGS: Normal breath sounds bilaterally, no wheezing, rales,rhonchi or crepitation. No use of accessory muscles of respiration.  CARDIOVASCULAR: S1, S2 normal. No murmurs, rubs, or gallops.  ABDOMEN: Soft, nontender, nondistended. Bowel sounds present.  EXTREMITIES: Left hip is tender and externally rotated no pedal edema, cyanosis, or clubbing.  NEUROLOGIC: Cranial nerves II through XII are intact. Sensation intact. Gait not checked.  PSYCHIATRIC: The patient is alert and oriented x 3.  SKIN: No obvious rash, lesion, or ulcer.    LABORATORY PANEL:   CBC Recent Labs  Lab 04/07/19 0413  WBC 13.9*  HGB 11.2*  HCT 33.8*  PLT 158   ------------------------------------------------------------------------------------------------------------------  Chemistries  Recent Labs  Lab 04/07/19 0413  NA 139  K 4.4  CL 107  CO2 25  GLUCOSE 138*  BUN 33*  CREATININE 1.26*  CALCIUM 9.3  MG 2.0   ------------------------------------------------------------------------------------------------------------------  Cardiac Enzymes No results for input(s): TROPONINI in the last 168 hours. ------------------------------------------------------------------------------------------------------------------  RADIOLOGY:  Dg Chest 1 View  Result Date: 04/06/2019 CLINICAL DATA:  Preoperative radiograph EXAM: CHEST  1 VIEW COMPARISON:  CT angiogram of the chest September 05, 2016 FINDINGS: The cardiac silhouette is mildly enlarged. Mediastinal contours appear intact. Calcific atherosclerotic disease and tortuosity of the aorta There is no evidence of focal airspace consolidation, pleural effusion or pneumothorax. Osseous structures are without acute abnormality. Soft tissues are grossly normal. IMPRESSION: 1. Mildly enlarged cardiac silhouette. 2. Calcific atherosclerotic disease and tortuosity of the aorta. Electronically Signed   By: Fidela Salisbury M.D.   On: 04/06/2019  12:30   Dg Hip Unilat With Pelvis 2-3 Views Left  Result  Date: 04/06/2019 CLINICAL DATA:  Left hip pain after fall today.  Initial encounter. EXAM: DG HIP (WITH OR WITHOUT PELVIS) 2-3V LEFT COMPARISON:  None. FINDINGS: The patient has an acute subcapital fracture of the left hip. No other acute abnormality is identified. Bones are osteopenic. Lower lumbar spondylosis noted. Atherosclerosis is seen. IMPRESSION: Acute subcapital fracture left hip. Electronically Signed   By: Inge Rise M.D.   On: 04/06/2019 12:28    EKG:   Orders placed or performed during the hospital encounter of 04/06/19  . ED EKG  . ED EKG  . EKG 12-Lead  . EKG 12-Lead    ASSESSMENT AND PLAN:   Patient is a 85 year old female with history of hypertension, chronic kidney disease stage III, mild dementia gastroesophageal reflux disease admitted for left hip fracture following mechanical fall at home  1.  Left hip fracture Secondary to mechanical fall at home.  Patient fell on the left side.  Did not hit her head.  No loss of consciousness.  No neurological deficits. Orthopedic surgery already consulted with plans for surgery today.  Patient is n.p.o. Patient is highly functional and lives independently at home such as at least 4 METS. Noted some diffuse T wave inversion on EKG which appears to be old.  Seen by cardiology Dr. Clayborn Bigness for clearance .  Awaiting his recommendations  pain control as needed  2.  Chronic kidney disease stage III Renal function fairly stable Avoid nephrotoxins and check a.m. labs Creatinine at 1.26  3.    Urinary tract infection with leukocytosis and abnormal urinalysis l UA positive for leukocytes and nitrites Patient with leukocytosis  We will get urine culture and sensitivity and start her on p.o. ciprofloxacin as patient is allergic to penicillins gets hives  4.  Mild dementia Patient awake and alert and oriented.  Still able to make her own medical decisions as confirmed  by daughter present at bedside.  Patient still lives independently at home. Stable      All the records are reviewed and case discussed with Care Management/Social Workerr. Management plans discussed with the patient, family and they are in agreement.  CODE STATUS: dnr   TOTAL TIME TAKING CARE OF THIS PATIENT: minutes.   POSSIBLE D/C IN 2-3  DAYS, DEPENDING ON CLINICAL CONDITION.  Note: This dictation was prepared with Dragon dictation along with smaller phrase technology. Any transcriptional errors that result from this process are unintentional.   Nicholes Mango M.D on 04/07/2019 at 1:42 PM  Between 7am to 6pm - Pager - [phone] After 6pm go to [email or web address] - password [email or web address] [email or web address]  Office  [phone]  CC: Primary care physician; Patient, No Pcp Per

## 2019-04-07 NOTE — Progress Notes (Signed)
Anticoagulation monitoring(Lovenox):  85 yo female ordered Lovenox 40 mg Q24h  Filed Weights   04/06/19 1120  Weight: 108 lb 14.5 oz (49.4 kg)   BMI    Lab Results  Component Value Date   CREATININE 1.26 (H) 04/07/2019   CREATININE 1.26 (H) 04/06/2019   CREATININE 1.24 (H) 09/18/2017   Estimated Creatinine Clearance: 18.8 mL/min (A) (by C-G formula based on SCr of 1.26 mg/dL (H)). Hemoglobin & Hematocrit     Component Value Date/Time   HGB 11.2 (L) 04/07/2019 0413   HGB 12.4 09/18/2017 1518   HCT 33.8 (L) 04/07/2019 0413   HCT 36.0 09/18/2017 1518     Per Protocol for Patient with estCrcl < 30 ml/min and BMI < 40, will transition to Lovenox 30 mg Q24h.

## 2019-04-07 NOTE — Transfer of Care (Signed)
Immediate Anesthesia Transfer of Care Note  Patient: Kimberly Abbott  Procedure(s) Performed: ARTHROPLASTY BIPOLAR HIP (HEMIARTHROPLASTY) (Left Hip)  Patient Location: PACU  Anesthesia Type:Spinal  Level of Consciousness: awake and alert   Airway & Oxygen Therapy: Patient Spontanous Breathing and Patient connected to face mask oxygen  Post-op Assessment: Report given to RN and Post -op Vital signs reviewed and stable  Post vital signs: Reviewed and stable  Last Vitals:  Vitals Value Taken Time  BP 143/69 04/07/19 1853  Temp    Pulse    Resp 14 04/07/19 1854  SpO2    Vitals shown include unvalidated device data.  Last Pain:  Vitals:   04/07/19 0800  TempSrc:   PainSc: 0-No pain         Complications: No apparent anesthesia complications

## 2019-04-07 NOTE — Progress Notes (Signed)
   04/07/19 0900  Clinical Encounter Type  Visited With Patient  Visit Type Follow-up  Referral From Chaplain  Consult/Referral To [facility] received a referral from the outgoing chaplain on call to visit and pray with the patient as she prepares for surgery. Upon arrival, the patient was laying awake, alert, and oriented. She was pleasant and welcoming to this chaplain, expressing gratitude for the follow-up. The patient affirmed that her surgery will be taking place this morning, but she is unsure of the time. She asked this chaplain what things "look like outside." This chaplain acknowledged that it is currently sunny and that the forecast is for the same, then asked the patient if she would like to have the blinds opened. The patient confirmed this and beamed when the blinds were opened. This chaplain offered prayer for a successful surgery and recovery, as requested. The patient expressed gratitude for the visit and prayer. Chaplain will follow up post-surgery.

## 2019-04-07 NOTE — TOC Initial Note (Signed)
Transition of Care [facility]) - Initial/Assessment Note    Patient Details  Name: Kimberly Abbott MRN: [id number] Date of Birth: 10/23/1920  Transition of Care [facility]) CM/SW Contact:    Kimberly Abbott, Kimberly Abbott Phone Number: [phone]  04/07/2019, 4:24 PM  Clinical Narrative: Clinical Social Worker (CSW) received consult for SNF placement. Per chart patient has a hip fracture. Surgery and PT are pending. CSW met with patient and her daughter Kimberly Abbott [phone] was at bedside. Patient was alert and oriented X3 and was laying in the bed. CSW introduced self and explained role of CSW department. Per patient she lives alone in an apartment complex [employer]. Per daughter she lives in [place] and is patient's only child. Daughter is unsure if patient has a HPOA. CSW explained that after surgery PT will evaluate patient and make a recommendation of home health or SNF. CSW explained that based on patient's age and injury she will likely need SNF. CSW provided daughter CMS SNF list. CSW explained that [facility] will have to approve SNF. Patient and daughter verbalized their understanding and are agreeable to SNF search in [place]. FL2 complete and faxed out. CSW will continue to follow and assist as needed.                Expected Discharge Plan: Skilled Nursing Facility Barriers to Discharge: Continued Medical Work up   Patient Goals and CMS Choice Patient states their goals for this hospitalization and ongoing recovery are:: Improve mobility CMS Medicare.gov Compare Post Acute Care list provided to:: Patient Represenative (must comment)(Patient's daughter Kimberly Abbott) Choice offered to / list presented to : Adult Children  Expected Discharge Plan and Services Expected Discharge Plan: [place] In-house Referral: Clinical Social Work Discharge Planning Services: CM Consult Post Acute Care Choice: [place] Living arrangements for the past 2 months:  Apartment Expected Discharge Date: 04/08/19                                    Prior Living Arrangements/Services Living arrangements for the past 2 months: Apartment Lives with:: Self Patient language and need for interpreter reviewed:: No Do you feel safe going back to the place where you live?: Yes      Need for Family Participation in Patient Care: Yes (Comment) Care giver support system in place?: No (comment)   Criminal Activity/Legal Involvement Pertinent to Current Situation/Hospitalization: No - Comment as needed  Activities of Daily Living Home Assistive Devices/Equipment: Walker (specify type), Cane (specify quad or straight) ADL Screening (condition at time of admission) Patient's cognitive ability adequate to safely complete daily activities?: Yes Is the patient deaf or have difficulty hearing?: No Does the patient have difficulty seeing, even when wearing glasses/contacts?: No Does the patient have difficulty concentrating, remembering, or making decisions?: No Patient able to express need for assistance with ADLs?: Yes Does the patient have difficulty dressing or bathing?: Yes Independently performs ADLs?: No Communication: Independent Dressing (OT): Dependent Is this a change from baseline?: Change from baseline, expected to last >3 days Grooming: Independent Feeding: Independent Bathing: Dependent, Needs assistance Is this a change from baseline?: Change from baseline, expected to last >3 days Toileting: Needs assistance, Dependent Is this a change from baseline?: Change from baseline, expected to last >3days In/Out Bed: Needs assistance, Dependent Is this a change from baseline?: Change from baseline, expected to last >3 days Walks in Home: Needs assistance, Dependent  Is this a change from baseline?: Change from baseline, expected to last >3 days Does the patient have difficulty walking or climbing stairs?: Yes Weakness of Legs: Left Weakness of  Arms/Hands: None  Permission Sought/Granted Permission sought to share information with : Chartered certified accountant granted to share information with : Yes, Verbal Permission Granted              Emotional Assessment Appearance:: Appears stated age Attitude/Demeanor/Rapport: Engaged Affect (typically observed): Pleasant, Calm Orientation: : Oriented to Self, Oriented to Place, Oriented to  Time, Oriented to Situation, Fluctuating Orientation (Suspected and/or reported Sundowners) Alcohol / Substance Use: Not Applicable Psych Involvement: No (comment)  Admission diagnosis:  Fall [W19.XXXA] Closed fracture of left hip, initial encounter [facility]) [S72.002A] Patient Active Problem List   Diagnosis Date Noted  . Closed left hip fracture ([place]) 04/06/2019  . Clostridium difficile colitis 04/30/2017  . Insomnia 04/24/2017  . Problems with swallowing and mastication   . SBO (small bowel obstruction) ([place]) 03/06/2017  . History of esophageal stricture 01/29/2017  . Hiatal hernia 01/29/2017  . Dementia ([place]) 01/29/2017  . Closed dislocation of tarsometatarsal joint 01/29/2017  . Vitamin D deficiency 01/29/2017  . Gait instability 01/28/2017  . Gout 01/28/2017  . Unintentional weight loss 01/28/2017  . Primary osteoarthritis of both hands 05/03/2016  . Chronic fatigue 09/01/2015  . Rotator cuff impingement syndrome of left shoulder 09/01/2015  . Pustular psoriasis of palms and soles 05/05/2015  . Chronic kidney disease, stage III (moderate) ([place]) 02/10/2015  . Allergic rhinitis 12/31/2014  . Dermatitis, eczematoid 12/31/2014  . Hyperlipidemia 11/08/2014  . Anemia 11/08/2014  . Recurrent UTI 11/08/2014  . Depression 11/08/2014  . Gastroesophageal reflux disease without esophagitis 11/08/2014  . Bladder instability 11/08/2014  . At moderate risk for fall 11/08/2014  . Dysphagia 11/08/2014  . Rotator cuff syndrome of right shoulder 11/08/2014  . Essential  hypertension 11/08/2014   PCP:  Patient, No Pcp Per Pharmacy:   [facility] [address]), [place] ([place])  [address] Phone: [phone] Fax: [place], [place] - [place] [address] Phone: [phone] Fax: [phone]     Social Determinants of Health (SDOH) Interventions    Readmission Risk Interventions No flowsheet data found.

## 2019-04-07 NOTE — Op Note (Signed)
04/07/2019  6:44 PM  Patient:   Kimberly Abbott  Pre-Op Diagnosis:   Displaced femoral neck fracture, left hip.  Post-Op Diagnosis:   Same.  Procedure:   Left hip unipolar hemiarthroplasty.  Surgeon:   Pascal Lux, MD  Assistant:   Cameron Proud, PA-C; Orland Penman, PA-S  Anesthesia:   Spinal  Findings:   As above.  Complications:   None  EBL:   100 cc  Fluids:   500 cc crystalloid  UOP:   200 cc  TT:   None  Drains:   None  Closure:   Staples  Implants:   Biomet press-fit system with a #14 standard offset full proximal profile Echo femoral stem, a 46 mm outer diameter shell, and a -6 mm neck adapter.  Brief Clinical Note:   The patient is a 85 year old female who sustained above-noted injury yesterday when she apparently fell in her home.  She was brought to the emergency room where x-rays demonstrated the above-noted injury.  She has been cleared medically and presents at this time for definitive management of this injury.  Procedure:   The patient was brought into the operating room. After adequate spinal anesthesia was obtained, the patient was repositioned in the right lateral decubitus position and secured using a lateral hip positioner. The left hip and lower extremity were prepped with ChloroPrep solution before being draped sterilely. Preoperative antibiotics were administered. A timeout was performed to verify the appropriate surgical site before a standard posterior approach to the hip was made through an approximately 4-5 inch incision. The incision was carried down through the subcutaneous tissues to expose the gluteal fascia and proximal end of the iliotibial band. These structures were split the length of the incision and the Charnley self-retaining hip retractor placed. The bursal tissues were swept posteriorly to expose the short external rotators. The anterior border of the piriformis tendon was identified and this plane developed down through the capsule to  enter the joint. Abundant fracture hematoma was suctioned. A flap of tissue was elevated off the posterior aspect of the femoral neck and greater trochanter and retracted posteriorly. This flap included the piriformis tendon, the short external rotators, and the posterior capsule. The femoral head was removed in its entirety, then taken to the back table where it was measured and found to be optimally replicated by a 46 mm head. The appropriate trial head was inserted and found to demonstrate an excellent suction fit.   Attention was directed to the femoral side. The femoral neck was recut 10-12 mm above the lesser trochanter using an oscillating saw. The piriformis fossa was debrided of soft tissues before the intramedullary canal was accessed through this point using a triple step reamer. The canal was reamed sequentially beginning with a #7 tapered reamer and progressing to a #15 tapered reamer. This provided excellent circumferential chatter. A box osteotome was used to establish version before the canal was broached sequentially beginning with a #10 broach and progressing to a #14 broach. An attempt was made to place the #15 broach, but this was too tight.  Therefore, it was elected to drop back to the number 14 broach, but to use the full proximal profile stem. The #14 full proximal profile broach was impacted into place. This was left in place and several trial reductions performed. The permanent #14 full proximal profile femoral stem was impacted into place. A repeat trial reduction was performed using the -6 mm  neck length. The -6 mm neck length  demonstrated excellent stability both in extension and external rotation as well as with flexion to 90 and internal rotation beyond 70. It also was stable in the position of sleep. The 46 mm outer diameter shell with the -6 mm neck adapter construct was put together on the back table before being impacted onto the stem of the femoral component. The Morse taper  locking mechanism was verified using manual distraction before the head was relocated and the hip placed through a range of motion with the findings as described above.  The wound was copiously irrigated with bacitracin saline solution via the jet lavage system before the peri-incisional and pericapsular tissues were injected with 30 cc of 0.5% Sensorcaine with epinephrine and 20 cc of Exparel diluted out to 60 cc with normal saline to help with postoperative analgesia. The posterior flap was reapproximated to the posterior aspect of the greater trochanter using #2 Tycron interrupted sutures placed through drill holes. Several additional #2 Tycron interrupted sutures were used to reinforce this layer of closure. The iliotibial band was reapproximated using #1 Vicryl interrupted sutures before the gluteal fascia was closed using a running #1 Vicryl suture. The subcutaneous tissues were closed in several layers using 2-0 Vicryl interrupted sutures before the skin was closed using staples. A sterile occlusive dressing was applied to the wound before the patient was placed into an abduction wedge pillow. The patient was then rolled back into the supine position on the hospital bed before being awakened and returned to the recovery room in satisfactory condition after tolerating the procedure well.

## 2019-04-07 NOTE — Progress Notes (Signed)
Notified Dr Marry Guan pt is unable to swallow po tylenol. Received new order for IV tylenol 1000 mg every 8 hours x 24 hrs.

## 2019-04-07 NOTE — Anesthesia Procedure Notes (Signed)
Spinal  Start time: 04/07/2019 4:50 PM End time: 04/07/2019 5:03 PM Staffing Anesthesiologist: Gunnar Fusi, MD Resident/CRNA: Jerrye Noble, CRNA Performed: anesthesiologist  Preanesthetic Checklist Completed: patient identified, site marked, surgical consent, pre-op evaluation, timeout performed, IV checked, risks and benefits discussed and monitors and equipment checked Spinal Block Patient position: right lateral decubitus Prep: Betadine Patient monitoring: continuous pulse ox and blood pressure Approach: midline Location: L3-4 Injection technique: single-shot Needle Needle type: Quincke  Needle gauge: 22 G Assessment Sensory level: T10

## 2019-04-08 ENCOUNTER — Encounter: Payer: Self-pay | Admitting: Surgery

## 2019-04-08 LAB — CBC
HCT: 28.8 % — ABNORMAL LOW (ref 36.0–46.0)
Hemoglobin: 9.3 g/dL — ABNORMAL LOW (ref 12.0–15.0)
MCH: 30 pg (ref 26.0–34.0)
MCHC: 32.3 g/dL (ref 30.0–36.0)
MCV: 92.9 fL (ref 80.0–100.0)
Platelets: 153 10*3/uL (ref 150–400)
RBC: 3.1 MIL/uL — ABNORMAL LOW (ref 3.87–5.11)
RDW: 13.5 % (ref 11.5–15.5)
WBC: 10.3 10*3/uL (ref 4.0–10.5)
nRBC: 0 % (ref 0.0–0.2)

## 2019-04-08 MED ORDER — SENNA 8.6 MG PO TABS
1.0000 | ORAL_TABLET | Freq: Every day | ORAL | Status: DC
  Administered 2019-04-08 – 2019-04-10 (×3): 8.6 mg via ORAL
  Filled 2019-04-08 (×4): qty 1

## 2019-04-08 MED ORDER — KETOROLAC TROMETHAMINE 15 MG/ML IJ SOLN
15.0000 mg | Freq: Three times a day (TID) | INTRAMUSCULAR | Status: DC | PRN
  Administered 2019-04-08: 15 mg via INTRAVENOUS
  Filled 2019-04-08: qty 1

## 2019-04-08 NOTE — Progress Notes (Signed)
Pt unable to keep any PO meds down at this time. Meds crushed and put in applesauce. Pt immediatly spit up meds. Pts daughter at bedside and said this a common occurrence at home prior to coming into hospital, MD notified.

## 2019-04-08 NOTE — TOC Progression Note (Signed)
Transition of Care [facility]) - Progression Note    Patient Details  Name: Kimberly Abbott MRN: [id number] Date of Birth: 14-May-1921  [facility] CM/SW Contact  Zelia Yzaguirre, Lenice Llamas Phone Number: [phone]  04/08/2019, 11:53 AM  Clinical Narrative: PT is recommending SNF. Clinical Social Worker (CSW) met with patient and her daughter [name] was at bedside. Patient was asleep and did not participate in conversation. CSW presented SNF bed offers to patient's daughter [name]. Daughter chose [facility]. CSW explained that [facility] will have to approve SNF. [name] liaison is aware of above and will start [facility] SNF authorization today. CSW will continue to follow and assist as needed.     Expected Discharge Plan: [place] Barriers to Discharge: Continued Medical Work up  Expected Discharge Plan and Services Expected Discharge Plan: [place] In-house Referral: Clinical Social Work Discharge Planning Services: CM Consult Post Acute Care Choice: [place] Living arrangements for the past 2 months: Apartment Expected Discharge Date: 04/08/19                                     Social Determinants of Health (SDOH) Interventions    Readmission Risk Interventions No flowsheet data found.

## 2019-04-08 NOTE — Evaluation (Signed)
Occupational Therapy Evaluation Patient Details Name: Kimberly Abbott MRN: [id number] DOB: 13-Mar-1921 Today's Date: 04/08/2019    History of Present Illness presented to ER secondary to mechanical fall with acute onset of L hip pain; admitted for management of L hip fracture, s/p L hip hemiarthroplasty (04/07/19), WBAT.   Clinical Impression   Pt seen for OT evaluation this date, POD#1 from above surgery. Pt was generally independent in ADLs prior to surgery, however occasionally using a SPC for household ambulation. Pt is eager to return to PLOF with less pain and improved safety and independence. Pt currently requires maximum to total assist LB ADL mgt including bathing, dressing, and toileting due to pain and limited AROM of L hip. Pt able to recall 0/3 posterior total hip precautions at start of session and unable to verbalize how to implement during ADL and mobility. Pt instructed in posterior total hip precautions and how to implement, self care skills, falls prevention strategies, home/routines modifications & DME/AE for LB bathing and dressing tasks. Pt lethargic t/o session. Declined mobility attempts this date. Given moderate cueing, pt able to return verbalize 3/3 hip precautions at end of OT evaluation. Pt would benefit from additional instruction in self care skills and techniques to help maintain precautions with or without assistive devices to support recall and carryover prior to discharge. Recommend STR upon hospital discharge to maximize pt safety and return to PLOF.       Follow Up Recommendations  SNF    Equipment Recommendations  3 in 1 bedside commode    Recommendations for Other Services       Precautions / Restrictions Precautions Precautions: Fall;Posterior Hip Precaution Booklet Issued: Yes (comment) Restrictions Weight Bearing Restrictions: Yes RLE Weight Bearing: Weight bearing as tolerated      Mobility Bed Mobility Overal bed mobility: Needs Assistance              General bed mobility comments: Deferred for pt comfort. Per physical therapy, pt mod-max assist for sup>sit on this date. Max-total assist for sit>sup.  Transfers Overall transfer level: Needs assistance Equipment used: Rolling walker (2 wheeled) Transfers: Sit to/from Stand Sit to Stand: Mod assist         General transfer comment: Deferred. Pt became orthostatic with standing attempt during physical therapy session. Will continue to monitor closely during future mobility attempts.    Balance Overall balance assessment: Needs assistance Sitting-balance support: No upper extremity supported;Feet supported Sitting balance-Leahy Scale: Good     Standing balance support: Bilateral upper extremity supported Standing balance-Leahy Scale: Poor                             ADL either performed or assessed with clinical judgement   ADL Overall ADL's : Needs assistance/impaired Eating/Feeding: Set up;Supervision/ safety Eating/Feeding Details (indicate cue type and reason): Pt on modified diet per SLP. Grooming: Set up;Supervision/safety   Upper Body Bathing: Sitting;Minimal assistance;Moderate assistance   Lower Body Bathing: Sitting/lateral leans;Moderate assistance;Maximal assistance   Upper Body Dressing : Minimal assistance;Moderate assistance;Sitting   Lower Body Dressing: Maximal assistance;Sit to/from stand;Moderate assistance   Toilet Transfer: RW;Stand-pivot;BSC   Toileting- Clothing Manipulation and Hygiene: Sit to/from stand;Maximal assistance         General ADL Comments: Cueing for hip precautions t/o ADL mgt.     Vision Baseline Vision/History: Wears glasses Wears Glasses: At all times(Does not have them with her) Patient Visual Report: No change from baseline  Perception     Praxis      Pertinent Vitals/Pain Pain Score: 8  Pain Location: BLE ankles; L hip Pain Descriptors / Indicators: Sore;Aching;Discomfort Pain  Intervention(s): Limited activity within patient's tolerance;Monitored during session;Repositioned     Hand Dominance Right   Extremity/Trunk Assessment Upper Extremity Assessment Upper Extremity Assessment: Overall WFL for tasks assessed   Lower Extremity Assessment Lower Extremity Assessment: LLE deficits/detail;Defer to PT evaluation LLE Deficits / Details: s/p L hemi hip arthroplasty LLE: Unable to fully assess due to pain LLE Coordination: decreased gross motor       Communication Communication Communication: No difficulties   Cognition Arousal/Alertness: Lethargic Behavior During Therapy: WFL for tasks assessed/performed Overall Cognitive Status: Within Functional Limits for tasks assessed                                 General Comments: Pt agreeable to education but declined mobility attempts. States she is feeling very tired. Closing eyes t/o evaluaiton.   General Comments  Pt with hip abduction pillow in place at start/end of session.    Exercises Other Exercises Other Exercises: Supine L LE therex, 1x10, act assist ROM: ankle pumps, quad sets, SAQs, heel slides, hip abduct/adduct.  Consistent verbal/tactile cuing for technique and isolated muscle activation. Other Exercises: Unsupported sitting edge of bed, sup/min assist-does require UE support for optimal safety/indep at all times. Other Exercises: BP assessment (after reported dizziness with transition to upright): sitting BP 85/48, HR 73; supine BP 99/49, HR 70.  Will plan to monitor full orthostatics next session as appropriate. Other Exercises: Pt and caregiver (daughter) educated in falls prevention strategies, posterior total hip precations, and safe use of AE for maximize pt safety and adherence to posterior THPs during activities of daily living. Handout provided.   Shoulder Instructions      Home Living Family/patient expects to be discharged to:: Private residence Living Arrangements:  Alone Available Help at Discharge: Family;Available PRN/intermittently Type of Home: Apartment Home Access: Level entry;Elevator     Home Layout: One level               Home Equipment: Walker - 4 wheels;Cane - single point;Adaptive equipment;Grab bars - tub/shower;Grab bars - toilet Adaptive Equipment: Reacher        Prior Functioning/Environment Level of Independence: Independent with assistive device(s)        Comments: Mod indep with ADLs, household ambulation with SPC; intermittent use of rollator for longer distances.  Pt endorses bathing, dressing, and managing medications independently. She states she does her own laundry at facility in her apartment complex. Family assists with meals/grocery shopping.        OT Problem List: Decreased strength;Decreased coordination;Pain;Decreased range of motion;Cardiopulmonary status limiting activity;Decreased activity tolerance;Decreased safety awareness;Impaired balance (sitting and/or standing);Decreased knowledge of use of DME or AE;Decreased knowledge of precautions      OT Treatment/Interventions: Self-care/ADL training;Balance training;Therapeutic exercise;Therapeutic activities;Energy conservation;DME and/or AE instruction;Patient/family education    OT Goals(Current goals can be found in the care plan section) Acute Rehab OT Goals Patient Stated Goal: to transition to rehab at discharge OT Goal Formulation: With patient Time For Goal Achievement: 04/22/19 Potential to Achieve Goals: Good ADL Goals Pt Will Perform Grooming: sitting;with set-up;with supervision(With LRAD PRN for improved safety, functional independence, and adherence to posterior THP's) Pt Will Perform Lower Body Dressing: with adaptive equipment;sit to/from stand;with min assist(With LRAD PRN for improved safety, functional independence, and adherence to  posterior THP's) Pt Will Transfer to Toilet: bedside commode;stand pivot transfer;with min assist(With  LRAD PRN for improved safety, functional independence, and adherence to posterior THP's)  OT Frequency: Min 1X/week   Barriers to D/C: Decreased caregiver support          Co-evaluation              AM-PAC OT "6 Clicks" Daily Activity     Outcome Measure Help from another person eating meals?: A Little Help from another person taking care of personal grooming?: A Little Help from another person toileting, which includes using toliet, bedpan, or urinal?: Total Help from another person bathing (including washing, rinsing, drying)?: Total Help from another person to put on and taking off regular upper body clothing?: A Little Help from another person to put on and taking off regular lower body clothing?: A Lot 6 Click Score: 13   End of Session    Activity Tolerance: Patient limited by pain;Patient limited by lethargy Patient left: in bed;with call bell/phone within reach;with bed alarm set;with SCD's reapplied;with family/visitor present  OT Visit Diagnosis: Other abnormalities of gait and mobility (R26.89);History of falling (Z91.81);Pain Pain - Right/Left: Left Pain - part of body: Hip                Time: [id number] OT Time Calculation (min): 26 min Charges:  OT General Charges $OT Visit: 1 Visit OT Evaluation $OT Eval Low Complexity: 1 Low OT Treatments $Self Care/Home Management : 8-22 mins  Shara Blazing, M.S., OTR/L Ascom: [phone] 04/08/19, 3:53 PM

## 2019-04-08 NOTE — Progress Notes (Signed)
Patient bladder scanned again at 2300; 276 in bladder. MD notified. Instructed to wait 4 more hours and rescan if she hasnt gone.

## 2019-04-08 NOTE — Progress Notes (Signed)
PT Cancellation Note  Patient Details Name: [name] MRN: [id number] DOB: 13-Feb-1921   Cancelled Treatment:    Reason Eval/Treat Not Completed: Fatigue/lethargy limiting ability to participate(Consult received and chart reviewed.  Patient sleeping soundly upon arrival to room.  Opens eyes to light touch, but unable to maintain for full participation with session.  Will re-attempt at later time this AM.)   Nettie Cromwell H. Owens Shark, PT, DPT, NCS 04/08/19, 9:30 AM [phone]

## 2019-04-08 NOTE — Evaluation (Signed)
Physical Therapy Evaluation Patient Details Name: Kimberly Abbott MRN: [id number] DOB: 09-16-1920 Today's Date: 04/08/2019   History of Present Illness  presented to ER secondary to mechanical fall with acute onset of L hip pain; admitted for management of L hip fracture, s/p L hip hemiarthroplasty (04/07/19), WBAT.  Clinical Impression  Patient remains generally lethargic, but arousable and more alert this attempt. Generally oriented to self, location and situation; follows commands and demonstrates fair effort with mobility assessment.  L LE pain rated 4-6/10 per FACES scale, worsened with movement and change of position.  Currently requiring max/total assist for bed mobility; sup/min assist for unsupported sitting balance (absent functional reach outside immediate BOS); mod assist for sit/stand and static standing balance with RW.  Did report onset of dizziness with transition to upright, requiring return to supine for recovery.  Additional OOB/gait efforts deferred as result.   BP assessment (after reported dizziness with transition to upright): sitting BP 85/48, HR 73; supine BP 99/49, HR 70.  Will plan to monitor full orthostatics next session as appropriate. RN informed/aware of response to treatment. Would benefit from skilled PT to address above deficits and promote optimal return to PLOF; recommend transition to STR upon discharge from acute hospitalization.     Follow Up Recommendations SNF    Equipment Recommendations       Recommendations for Other Services       Precautions / Restrictions Precautions Precautions: Fall;Posterior Hip Restrictions Weight Bearing Restrictions: Yes RLE Weight Bearing: Weight bearing as tolerated      Mobility  Bed Mobility Overal bed mobility: Needs Assistance Bed Mobility: Supine to Sit;Sit to Supine     Supine to sit: Mod assist;Max assist Sit to supine: Max assist;Total assist   General bed mobility comments: assist for  position/protection of L LE, trunk elevation and control  Transfers Overall transfer level: Needs assistance Equipment used: Rolling walker (2 wheeled) Transfers: Sit to/from Stand Sit to Stand: Mod assist         General transfer comment: extensive assist for lift off, anterior weight translation and standing balance; onset of dizziness reported with transition to upright, returned to sitting position for safety (BP at 85/48, HR 73)  Ambulation/Gait             General Gait Details: deferred due to hypotension, orthostasis with transition to upright  Stairs            Wheelchair Mobility    Modified Rankin (Stroke Patients Only)       Balance Overall balance assessment: Needs assistance Sitting-balance support: No upper extremity supported;Feet supported Sitting balance-Leahy Scale: Good     Standing balance support: Bilateral upper extremity supported Standing balance-Leahy Scale: Poor                               Pertinent Vitals/Pain Pain Assessment: Faces Faces Pain Scale: Hurts little more Pain Location: L hip Pain Descriptors / Indicators: Aching;Guarding;Grimacing Pain Intervention(s): Limited activity within patient's tolerance;Monitored during session;Repositioned    Home Living Family/patient expects to be discharged to:: Private residence Living Arrangements: Alone Available Help at Discharge: Family;Available PRN/intermittently Type of Home: Apartment Home Access: Level entry;Elevator     Home Layout: One level Home Equipment: Walker - 4 wheels;Cane - single point      Prior Function Level of Independence: Independent with assistive device(s)         Comments: Mod indep with ADLs, household ambulation with SPC;  intermittent use of rollator for longer distances.  Completes sponge bathing for ADLs; fixes own breakfast. Family provides/assists with meals otherwise.     Hand Dominance        Extremity/Trunk  Assessment   Upper Extremity Assessment Upper Extremity Assessment: Overall WFL for tasks assessed    Lower Extremity Assessment Lower Extremity Assessment: (L hip and knee grossly 3-/5, limited by pain.  Otherwise, grossly 4-/5 throughout)       Communication   Communication: No difficulties  Cognition Arousal/Alertness: Lethargic Behavior During Therapy: WFL for tasks assessed/performed Overall Cognitive Status: Within Functional Limits for tasks assessed                                        General Comments      Exercises Other Exercises Other Exercises: Supine L LE therex, 1x10, act assist ROM: ankle pumps, quad sets, SAQs, heel slides, hip abduct/adduct.  Consistent verbal/tactile cuing for technique and isolated muscle activation. Other Exercises: Unsupported sitting edge of bed, sup/min assist-does require UE support for optimal safety/indep at all times. Other Exercises: BP assessment (after reported dizziness with transition to upright): sitting BP 85/48, HR 73; supine BP 99/49, HR 70.  Will plan to monitor full orthostatics next session as appropriate.   Assessment/Plan    PT Assessment Patient needs continued PT services  PT Problem List Decreased strength;Decreased range of motion;Decreased activity tolerance;Decreased balance;Decreased mobility;Decreased knowledge of use of DME;Decreased coordination;Decreased safety awareness;Decreased knowledge of precautions;Cardiopulmonary status limiting activity;Decreased skin integrity;Pain       PT Treatment Interventions DME instruction;Gait training;Stair training;Functional mobility training;Therapeutic activities;Therapeutic exercise;Balance training;Patient/family education    PT Goals (Current goals can be found in the Care Plan section)  Acute Rehab PT Goals Patient Stated Goal: to transition to rehab at discharge PT Goal Formulation: With patient/family Time For Goal Achievement:  04/22/19 Potential to Achieve Goals: Fair    Frequency 7X/week   Barriers to discharge Decreased caregiver support      Co-evaluation               AM-PAC PT "6 Clicks" Mobility  Outcome Measure Help needed turning from your back to your side while in a flat bed without using bedrails?: A Lot Help needed moving from lying on your back to sitting on the side of a flat bed without using bedrails?: A Lot Help needed moving to and from a bed to a chair (including a wheelchair)?: A Lot Help needed standing up from a chair using your arms (e.g., wheelchair or bedside chair)?: A Lot Help needed to walk in hospital room?: Total   6 Click Score: 9    End of Session Equipment Utilized During Treatment: Gait belt Activity Tolerance: Treatment limited secondary to medical complications (Comment)(symptomatic orthostasis) Patient left: in bed;with call bell/phone within reach;with bed alarm set;with family/visitor present Nurse Communication: Mobility status PT Visit Diagnosis: Muscle weakness (generalized) (M62.81);Difficulty in walking, not elsewhere classified (R26.2)    Time: [id number] PT Time Calculation (min) (ACUTE ONLY): 37 min   Charges:   PT Evaluation $PT Eval Moderate Complexity: 1 Mod PT Treatments $Therapeutic Exercise: 8-22 mins $Therapeutic Activity: 8-22 mins        Natalia Wittmeyer H. Owens Shark, PT, DPT, NCS 04/08/19, 11:54 AM [phone]

## 2019-04-08 NOTE — Anesthesia Postprocedure Evaluation (Signed)
Anesthesia Post Note  Patient: Kimberly Abbott  Procedure(s) Performed: ARTHROPLASTY BIPOLAR HIP (HEMIARTHROPLASTY) (Left Hip)  Patient location during evaluation: Nursing Unit Anesthesia Type: Spinal Level of consciousness: awake and alert and lethargic Pain management: pain level controlled Vital Signs Assessment: post-procedure vital signs reviewed and stable Respiratory status: spontaneous breathing Cardiovascular status: stable Postop Assessment: patient able to bend at knees, no apparent nausea or vomiting, able to ambulate and adequate PO intake Anesthetic complications: no     Last Vitals:  Vitals:   04/08/19 0408 04/08/19 0724  BP: (!) 109/56 (!) 152/70  Pulse: 72 70  Resp: 18 16  Temp: 37.1 C 36.6 C  SpO2: 99% 99%    Last Pain:  Vitals:   04/08/19 0724  TempSrc: Oral  PainSc:                  Lanora Manis

## 2019-04-08 NOTE — Progress Notes (Signed)
Ch f/u with pt per request. Pt was resting and no responsive upon ch arrival. Pt's daughter was at bedside who stayed overnight w/ pt. As reported by the pt's daughter, the pt ate all of her lunch with no complications and has been resting a lot post-op. Ch provided words of encouragement for pt daughter for a safe recovery of pt as she transitions to [facility] for rehab and possibly with daughter after she completes her time there. No further needs at this time.    04/08/19 1400  Clinical Encounter Type  Visited With Patient and family together  Visit Type Psychological support;Spiritual support;Social support;Post-op  Referral From Chaplain  Consult/Referral To Chaplain  Stress Factors  Patient Stress Factors Health changes;Major life changes  Family Stress Factors Exhausted

## 2019-04-08 NOTE — Progress Notes (Signed)
  Subjective: 1 Day Post-Op Procedure(s) (LRB): ARTHROPLASTY BIPOLAR HIP (HEMIARTHROPLASTY) (Left) Patient reports pain as mild.   Patient is resting.  She keeps her eyes closed but answers all of my questions. PT and care management to assist with discharge planning. Negative for chest pain and shortness of breath Fever: no Gastrointestinal:Negative for nausea and vomiting  Objective: Vital signs in last 24 hours: Temp:  [97.8 F (36.6 C)-99.4 F (37.4 C)] 97.8 F (36.6 C) (09/09 0724) Pulse Rate:  [70-85] 70 (09/09 0724) Resp:  [13-19] 16 (09/09 0724) BP: (109-153)/(56-79) 152/70 (09/09 0724) SpO2:  [94 %-100 %] 99 % (09/09 0724)  Intake/Output from previous day:  Intake/Output Summary (Last 24 hours) at 04/08/2019 0755 Last data filed at 04/08/2019 0416 Gross per 24 hour  Intake 500 ml  Output 500 ml  Net 0 ml    Intake/Output this shift: No intake/output data recorded.  Labs: Recent Labs    04/06/19 1141 04/07/19 0413 04/08/19 0312  HGB 12.3 11.2* 9.3*   Recent Labs    04/07/19 0413 04/08/19 0312  WBC 13.9* 10.3  RBC 3.66* 3.10*  HCT 33.8* 28.8*  PLT 158 153   Recent Labs    04/06/19 1141 04/07/19 0413  NA 138 139  K 4.3 4.4  CL 105 107  CO2 25 25  BUN 34* 33*  CREATININE 1.26* 1.26*  GLUCOSE 117* 138*  CALCIUM 9.7 9.3   No results for input(s): LABPT, INR in the last 72 hours.   EXAM General - Patient is Alert and Appropriate Extremity - ABD soft Sensation intact distally Intact pulses distally Dorsiflexion/Plantar flexion intact Incision: dressing C/D/I No cellulitis present Dressing/Incision - clean, dry, no drainage Motor Function - intact, moving foot and toes well on exam.   Past Medical History:  Diagnosis Date  . Allergy   . CKD (chronic kidney disease)   . GERD (gastroesophageal reflux disease)   . Hyperlipidemia   . Hypertension     Assessment/Plan: 1 Day Post-Op Procedure(s) (LRB): ARTHROPLASTY BIPOLAR HIP  (HEMIARTHROPLASTY) (Left) Active Problems:   Closed left hip fracture (HCC)  Estimated body mass index is 21.27 kg/m as calculated from the following:   Height as of this encounter: 5' (1.524 m).   Weight as of this encounter: 49.4 kg. Advance diet Up with therapy D/C IV fluids when tolerating po intake.  Labs reviewed this AM, Hg 9.3 this AM.  Continue to monitor. Up with therapy today. Begin working on BM.  Patient will likely need SNF upon discharge.  DVT Prophylaxis - Lovenox, Foot Pumps and TED hose Weight-Bearing as tolerated to left leg  J. Cameron Proud, PA-C [facility] Orthopaedic Surgery 04/08/2019, 7:55 AM

## 2019-04-08 NOTE — Progress Notes (Signed)
Pt unable to void since foley removed. Bladder scan showed 120 ml in bladder. Md notified, no additional orders at this time. Will continue to reassess.

## 2019-04-08 NOTE — Evaluation (Addendum)
Clinical/Bedside Swallow Evaluation Patient Details  Name: Kimberly Abbott MRN: [id number] Date of Birth: [birth year]  Today's Date: 04/08/2019 Time: SLP Start Time (ACUTE ONLY): 1140 SLP Stop Time (ACUTE ONLY): 1230 SLP Time Calculation (min) (ACUTE ONLY): 50 min  Past Medical History:  Past Medical History:  Diagnosis Date  . Allergy   . CKD (chronic kidney disease)   . GERD (gastroesophageal reflux disease)   . Hyperlipidemia   . Hypertension    Past Surgical History:  Past Surgical History:  Procedure Laterality Date  . APPENDECTOMY    . ESOPHAGOGASTRODUODENOSCOPY (EGD) WITH PROPOFOL N/A 03/12/2017   Procedure: ESOPHAGOGASTRODUODENOSCOPY (EGD) WITH PROPOFOL;  Surgeon: Lucilla Lame, MD;  Location: [facility] ENDOSCOPY;  Service: Endoscopy;  Laterality: N/A;  . rectal fistula  in the 40's  . TUBAL LIGATION     HPI:  Pt is a 85 y.o. female with a past medical history of CKD, Hiatal Hernia, gastric reflux and Esophageal phase dysmotility, hypertension, hyperlipidemia, mild dementia, presents to the emergency department after a fall.  According to the patient and her son patient had a fall early this morning landing on her left side.  Patient denies hitting her head, denies LOC.  Patient's only complaint is left hip pain.  They went to [facility] clinic and were diagnosed with a likely hip fracture and sent to the emergency department. Orthopedics performed ARTHROPLASTY BIPOLAR HIP (HEMIARTHROPLASTY) (Left) yesterday w/ a Spinal Block for Anesthesia. Today, pt exhibited difficulty swallowing pills/po's w/ NSG and ST services was consulted.  OF NOTE: per Chart notes, pt has a LONG STANDING H/O ESOPHAGEAL DYSPHAGIA/DYSMOTILITY including Hiatal Hernia, Esophageal Strictures w/ Dilitations(the last in 2018 per Dtr/chart it appears), Cricopharyngeal achalasia, GERD, and weight loss w/ Protein-calorie Malnutrition, severe. Dtr stated pt has been told by MDs/GI(?) that "there is nothing more to do for the  Esophagus issues" and that "she must live with it"; pt has been encouraged to drink Ensure/more drink supplements by GI/MDs, per Dtr. Pt and Dtr describe intermittent episodes of Regurgitation and Esophageal phase Dysmotility - "it just won't go down at first". Dtr stated she felt it's been "about the same" for "awhile now" but suspect it could be exacerbated by current illness/stress of. Current CXR: "no evidence of focal airspace consolidation, pleural effusion or pneumothorax."  Assessment / Plan / Recommendation Clinical Impression  Pt appears to present w/ adequate oropharyngeal phase swallowing funtion and tolerated small trials of thin liquids and purees w/ no immediate, overt s/s of aspiration noted; no overt difficulty or discomfort swallowing reported by pt during/post trials. Pt exhibited timely oral phase management, transfer, and oral clearing. Vocal quality clear b/t trials; no decline in respiratory status during/post trials. However, pt has a long standing h/o Esophageal phase dysphagia w/ difficulty clearing foods of increased texture through the Esophagus. This dysmotility can in turn impact ANYTHING else she attempts to swallowing whether food or liquid. Education was given on general Reflux precautions, Esophageal phase dysphagia impact on the oropharyngeal phases of swallowing, food consistencies and textures, recommendations for certain textures to avoid(Meats and Breads), and recommendation on food preprations(use of condiments/soups to moisten well). Recommended to pt/Dtr for pt to STOP eating/rest b/t bites/sips to allow Esophageal clearing - in order to avoid regurgitation of food/liquid material - recommended only 2 bites or sips at a time, REST BREAD, then 2 bites or sips, REST BREAK. OM exam was [facility]. Pt was able to feed self w/ setup, support to sit upright.   Due to pt's Esophageal phase  dysmotility, recommend a Mech Soft consistency w/ meats Minced(if eating such) and moistening  well; thin liquids. Avoid breads and thicker foods, starches. Recommend general aspiration precautions; STRICT REFLUX precautions; meds CRUSHED in Puree - for easier swallowing and clearing of the Esophagus. Dietician consult for drink supplements. Thorough education given on Reflux and Esophageal dysmotility; food options and preparation in hopes to reduce risk for aspiration of Regurgitated material thus Pulmonary impact. Recommended f/u by GI for more information/education re: hiatal hernia, pt's Esophageal phase deficits. Pt/family agreed.    OF NOTE: per Chart notes, pt has a LONG STANDING H/O ESOPHAGEAL DYSPHAGIA/DYSMOTILITY including Hiatal Hernia, Esophageal Strictures w/ Dilitations(the last in 2018 per Dtr/chart it appears), Cricopharyngeal achalasia, GERD, and weight loss w/ Protein-calorie Malnutrition, severe. Dtr stated pt has been told by MDs/GI(?) that "there is nothing more to do for the Esophagus issues" and that "she must live with it". Pt and Dtr describe intermittent episodes of Regurgitation and Esophageal phase Dysmotility - "it just won't go down at first". Dtr stated she felt it's been "about the same" for "awhile now" at home, but suspect it could be exacerbated by current illness/stress of fall and admission.  No further skilled ST services indicated as pt appears at her baseline; pt/Dtr to f/u w/ GI as needed. MD updated.  SLP Visit Diagnosis: Dysphagia, pharyngoesophageal phase (R13.14)(Esophageal phase dysphagia)    Aspiration Risk  Mild aspiration risk;Risk for inadequate nutrition/hydration(risk for Regurgitation from Esophagus and aspiration)    Diet Recommendation  Dysphagia level 2/3 foods w/ Thin liquids - NO STRAWS(to avoid swallowing extra air); strict REFLUX precautions; general aspiration precautions. Tray setup.   Medication Administration: Crushed with puree    Other  Recommendations Recommended Consults: Consider GI evaluation;Consider esophageal  assessment(ongoing ed/management by GI; Dietician f/u) Oral Care Recommendations: Oral care BID;Patient independent with oral care;Staff/trained caregiver to provide oral care Other Recommendations: (n/a)   Follow up Recommendations None      Frequency and Duration (n/a)  (n/a)       Prognosis Prognosis for Safe Diet Advancement: Fair Barriers to Reach Goals: Time post onset;Severity of deficits(GI)      Swallow Study   General Date of Onset: 04/06/19 HPI: Pt is a 85 y.o. female with a past medical history of CKD, Hiatal Hernia, gastric reflux and Esophageal phase dysmotility, hypertension, hyperlipidemia, mild dementia, presents to the emergency department after a fall.  According to the patient and her son patient had a fall early this morning landing on her left side.  Patient denies hitting her head, denies LOC.  Patient's only complaint is left hip pain.  They went to [facility] clinic and were diagnosed with a likely hip fracture and sent to the emergency department. Orthopedics performed ARTHROPLASTY BIPOLAR HIP (HEMIARTHROPLASTY) (Left) yesterday w/ a Spinal Block for Anesthesia. Today, pt exhibited difficulty swallowing pills/po's w/ NSG and ST services was consulted.  OF NOTE: per Chart notes, pt has a LONG STANDING H/O ESOPHAGEAL DYSPHAGIA/DYSMOTILITY including Hiatal Hernia, Esophageal Strictures w/ Dilitations(the last in 2018 per Dtr/chart it appears), Cricopharyngeal achalasia, GERD, and weight loss w/ Protein-calorie Malnutrition, severe. Dtr stated pt has been told by MDs/GI(?) that "there is nothing more to do for the Esophagus issues" and that "she must live with it"; pt has been encouraged to drink Ensure/more drink supplements by GI/MDs, per Dtr. Pt and Dtr describe intermittent episodes of Regurgitation and Esophageal phase Dysmotility - "it just won't go down at first". Dtr stated she felt it's been "about the same"  for "awhile now" but suspect it could be exacerbated by  current illness/stress of.  Type of Study: Bedside Swallow Evaluation Previous Swallow Assessment: 2017; 2018 w/ Barium study in 2018 revealing "pt has no penetration or aspiration, but does exhibit a prominent CP and presbyesophagus" Diet Prior to this Study: Regular;Thin liquids Temperature Spikes Noted: No(wbc 10.3) Respiratory Status: Nasal cannula History of Recent Intubation: No(spinal block for hip surgery per chart) Behavior/Cognition: Alert;Cooperative;Pleasant mood;Distractible;Requires cueing(baseline mild Dementia) Oral Cavity Assessment: Within Functional Limits Oral Care Completed by SLP: Recent completion by staff Oral Cavity - Dentition: Adequate natural dentition Vision: Functional for self-feeding Self-Feeding Abilities: Able to feed self;Needs assist;Needs set up Patient Positioning: Upright in bed(needed min positioning) Baseline Vocal Quality: Normal;Low vocal intensity(soft) Volitional Cough: Strong Volitional Swallow: Able to elicit    Oral/Motor/Sensory Function Overall Oral Motor/Sensory Function: Within functional limits   Ice Chips Ice chips: Within functional limits(x1 trial) Presentation: Spoon(fed)   Thin Liquid Thin Liquid: Within functional limits Presentation: Cup;Self Fed(6 trials) Other Comments: rest breaks in b/t evary 2-3 sips    Nectar Thick Nectar Thick Liquid: Not tested   Honey Thick Honey Thick Liquid: Not tested   Puree Puree: Within functional limits Presentation: Self Fed;Spoon(3 trials)   Solid     Solid: Not tested Other Comments: awaiting lunch meal of potato soup - did not want to overfill pt       Orinda Kenner, MS, CCC-SLP Watson,Katherine 04/08/2019,2:19 PM

## 2019-04-08 NOTE — Progress Notes (Signed)
[facility] [place] NAME: Kimberly Abbott    MR#:  [id number]  DATE OF BIRTH:  [birth year]  SUBJECTIVE:  CHIEF COMPLAINT: Patient is very tired today, PT is planning to work with her later.  Daughter at bedside   REVIEW OF SYSTEMS:  CONSTITUTIONAL: Reports fatigue or weakness.  EYES: No blurred or double vision.  EARS, NOSE, AND THROAT: No tinnitus or ear pain.  RESPIRATORY: No cough, shortness of breath, wheezing or hemoptysis.  CARDIOVASCULAR: No chest pain, orthopnea, edema.  GASTROINTESTINAL: No nausea, vomiting, diarrhea or abdominal pain.  GENITOURINARY: No dysuria, hematuria.  ENDOCRINE: No polyuria, nocturia,  HEMATOLOGY: No anemia, easy bruising or bleeding SKIN: No rash or lesion. MUSCULOSKELETAL: Left hip pain  nEUROLOGIC: No tingling, numbness, weakness.  PSYCHIATRY: No anxiety or depression.   DRUG ALLERGIES:   Allergies  Allergen Reactions  . Penicillin G Hives  . Penicillins Hives    Has patient had a PCN reaction causing immediate rash, facial/tongue/throat swelling, SOB or lightheadedness with hypotension: No Has patient had a PCN reaction causing severe rash involving mucus membranes or skin necrosis: No Has patient had a PCN reaction that required hospitalization No Has patient had a PCN reaction occurring within the last 10 years: No If all of the above answers are "NO", then may proceed with Cephalosporin use.    VITALS:  Blood pressure (!) 102/48, pulse 67, temperature 98.2 F (36.8 C), temperature source Axillary, resp. rate 15, height 5' (1.524 m), weight 49.4 kg, SpO2 96 %.  PHYSICAL EXAMINATION:  GENERAL:  85 y.o.-year-old patient lying in the bed with no acute distress.  EYES: Pupils equal, round, reactive to light and accommodation. No scleral icterus. Extraocular muscles intact.  HEENT: Head atraumatic, normocephalic. Oropharynx and nasopharynx clear.  NECK:  Supple, no jugular venous  distention. No thyroid enlargement, no tenderness.  LUNGS: Normal breath sounds bilaterally, no wheezing, rales,rhonchi or crepitation. No use of accessory muscles of respiration.  CARDIOVASCULAR: S1, S2 normal. No murmurs, rubs, or gallops.  ABDOMEN: Soft, nontender, nondistended. Bowel sounds present.  EXTREMITIES: Left hip is tender and externally rotated no pedal edema, cyanosis, or clubbing.  NEUROLOGIC: Cranial nerves II through XII are intact. Sensation intact. Gait not checked.  PSYCHIATRIC: The patient is alert and oriented x 3.  SKIN: No obvious rash, lesion, or ulcer.    LABORATORY PANEL:   CBC Recent Labs  Lab 04/08/19 0312  WBC 10.3  HGB 9.3*  HCT 28.8*  PLT 153   ------------------------------------------------------------------------------------------------------------------  Chemistries  Recent Labs  Lab 04/07/19 0413  NA 139  K 4.4  CL 107  CO2 25  GLUCOSE 138*  BUN 33*  CREATININE 1.26*  CALCIUM 9.3  MG 2.0   ------------------------------------------------------------------------------------------------------------------  Cardiac Enzymes No results for input(s): TROPONINI in the last 168 hours. ------------------------------------------------------------------------------------------------------------------  RADIOLOGY:  Dg Hip Unilat With Pelvis 2-3 Views Left  Result Date: 04/07/2019 CLINICAL DATA:  Postop EXAM: DG HIP (WITH OR WITHOUT PELVIS) 2-3V LEFT COMPARISON:  04/06/2019 FINDINGS: Changes of left hip replacement. Normal AP alignment. No hardware or bony complicating feature. Degenerative changes in the right hip. Diffuse vascular calcifications. IMPRESSION: Left hip replacement.  No complicating feature. Electronically Signed   By: Rolm Baptise M.D.   On: 04/07/2019 21:46    EKG:   Orders placed or performed during the hospital encounter of 04/06/19  . ED EKG  . ED EKG  . EKG 12-Lead  . EKG 12-Lead    ASSESSMENT  AND PLAN:   Patient  is a 85 year old female with history of hypertension, chronic kidney disease stage III, mild dementia gastroesophageal reflux disease admitted for left hip fracture following mechanical fall at home  1.  Left hip fracture Secondary to mechanical fall at home.  Patient fell on the left side.  Did not hit her head.  No loss of consciousness.  No neurological deficits. Postop day #1  patient is highly functional and lives independently at home such as at least 4 METS. Patient is seen by cardiology Dr. Clayborn Bigness and cleared for surgery . pain control as needed PT consult pending Hemoglobin 12.3-11.2-9.3 we will continue close monitoring DVT prophylaxis with Lovenox subcu  2.  Chronic kidney disease stage III Renal function fairly stable Avoid nephrotoxins and check a.m. labs Creatinine at 1.26  3.    Urinary tract infection with leukocytosis and abnormal urinalysis l UA positive for leukocytes and nitrites Patient with leukocytosis  We will get urine culture and sensitivity and start her on p.o. ciprofloxacin as patient is allergic to penicillins gets hives  4.  Mild dementia Patient awake and alert and oriented.  Still able to make her own medical decisions as confirmed by daughter present at bedside.  Patient still lives independently at home. Stable    Discussed with social worker anticipating discharge on Friday  All the records are reviewed and case discussed with Care Management/Social Workerr. Management plans discussed with the patient, family and they are in agreement.  CODE STATUS: dnr   TOTAL TIME TAKING CARE OF THIS PATIENT: minutes.   POSSIBLE D/C IN 2-  DAYS, DEPENDING ON CLINICAL CONDITION.  Note: This dictation was prepared with Dragon dictation along with smaller phrase technology. Any transcriptional errors that result from this process are unintentional.   Nicholes Mango M.D on 04/08/2019 at 2:41 PM  Between 7am to 6pm - Pager - [phone] After 6pm go to  [email or web address] - password [email or web address] [facility]  Office  [phone]  CC: Primary care physician; Patient, No Pcp Per

## 2019-04-09 LAB — CBC
HCT: 25.3 % — ABNORMAL LOW (ref 36.0–46.0)
Hemoglobin: 8.1 g/dL — ABNORMAL LOW (ref 12.0–15.0)
MCH: 30.8 pg (ref 26.0–34.0)
MCHC: 32 g/dL (ref 30.0–36.0)
MCV: 96.2 fL (ref 80.0–100.0)
Platelets: 125 10*3/uL — ABNORMAL LOW (ref 150–400)
RBC: 2.63 MIL/uL — ABNORMAL LOW (ref 3.87–5.11)
RDW: 13.8 % (ref 11.5–15.5)
WBC: 8 10*3/uL (ref 4.0–10.5)
nRBC: 0 % (ref 0.0–0.2)

## 2019-04-09 LAB — BASIC METABOLIC PANEL
Anion gap: 6 (ref 5–15)
BUN: 45 mg/dL — ABNORMAL HIGH (ref 8–23)
CO2: 24 mmol/L (ref 22–32)
Calcium: 8.5 mg/dL — ABNORMAL LOW (ref 8.9–10.3)
Chloride: 111 mmol/L (ref 98–111)
Creatinine, Ser: 1.5 mg/dL — ABNORMAL HIGH (ref 0.44–1.00)
GFR calc Af Amer: 34 mL/min — ABNORMAL LOW (ref >60)
GFR calc non Af Amer: 29 mL/min — ABNORMAL LOW (ref >60)
Glucose, Bld: 102 mg/dL — ABNORMAL HIGH (ref 70–99)
Potassium: 4.4 mmol/L (ref 3.5–5.1)
Sodium: 141 mmol/L (ref 135–145)

## 2019-04-09 LAB — SURGICAL PATHOLOGY

## 2019-04-09 MED ORDER — OXYCODONE HCL 5 MG/5ML PO SOLN
2.5000 mg | ORAL | Status: DC | PRN
  Administered 2019-04-09 – 2019-04-10 (×2): 5 mg via ORAL
  Filled 2019-04-09 (×2): qty 5

## 2019-04-09 MED ORDER — CEPHALEXIN 250 MG PO CAPS
250.0000 mg | ORAL_CAPSULE | Freq: Two times a day (BID) | ORAL | Status: DC
  Administered 2019-04-09 – 2019-04-10 (×3): 250 mg via ORAL
  Filled 2019-04-09 (×4): qty 1

## 2019-04-09 NOTE — Progress Notes (Signed)
Physical Therapy Treatment Patient Details Name: Kimberly Abbott MRN: [id number] DOB: 1921-02-11 Today's Date: 04/09/2019    History of Present Illness presented to ER secondary to mechanical fall with acute onset of L hip pain; admitted for management of L hip fracture, s/p L hip hemiarthroplasty (04/07/19), WBAT.    PT Comments    Lightly sleeping but awoke for session.  Participated in exercises as described below.  Mod/max a x 1 to edge of bed and she is able to sit with supervision for safety.  Stood with min a x 2and was able to transfer to recliner at bedside with min a x 1.  Orthostatic BP's taken 148/61 supine, sitting 131/61, standing 120/71 and unable to stand for 3 minute measurement due to fatigue.  Documented in flow sheets also.  She did report some dizziness but was able to tolerate transfer.     Follow Up Recommendations  SNF     Equipment Recommendations  Rolling walker with 5" wheels    Recommendations for Other Services       Precautions / Restrictions Precautions Precautions: Fall;Posterior Hip Restrictions Weight Bearing Restrictions: Yes RLE Weight Bearing: Weight bearing as tolerated    Mobility  Bed Mobility Overal bed mobility: Needs Assistance Bed Mobility: Supine to Sit     Supine to sit: Mod assist;Max assist        Transfers Overall transfer level: Needs assistance Equipment used: Rolling walker (2 wheeled) Transfers: Sit to/from Stand Sit to Stand: Min assist;+2 physical assistance            Ambulation/Gait Ambulation/Gait assistance: Min assist;+2 physical assistance Gait Distance (Feet): 3 Feet Assistive device: Rolling walker (2 wheeled) Gait Pattern/deviations: Step-to pattern Gait velocity: decreased   General Gait Details: tolerated transfer to chair today with minimal dizziness noted and improved BP's   Stairs             Wheelchair Mobility    Modified Rankin (Stroke Patients Only)       Balance Overall  balance assessment: Needs assistance Sitting-balance support: No upper extremity supported;Feet supported Sitting balance-Leahy Scale: Good     Standing balance support: Bilateral upper extremity supported Standing balance-Leahy Scale: Poor                              Cognition Arousal/Alertness: Awake/alert Behavior During Therapy: WFL for tasks assessed/performed Overall Cognitive Status: Within Functional Limits for tasks assessed                                 General Comments: lightly sleeping upon arrival but awoke for session.      Exercises Other Exercises Other Exercises: Supine L LE therex, 1x10, act assist ROM: ankle pumps, quad sets, SAQs, heel slides, hip abduct/adduct.  Consistent verbal/tactile cuing for technique and isolated muscle activation.    General Comments        Pertinent Vitals/Pain Pain Assessment: Faces Faces Pain Scale: Hurts little more Pain Location: L hip - generally improved comfort today with movement Pain Descriptors / Indicators: Sore;Aching;Discomfort Pain Intervention(s): Limited activity within patient's tolerance;Monitored during session    Home Living                      Prior Function            PT Goals (current goals can now be found in the care  plan section) Progress towards PT goals: Progressing toward goals    Frequency    7X/week      PT Plan Current plan remains appropriate    Co-evaluation              AM-PAC PT "6 Clicks" Mobility   Outcome Measure  Help needed turning from your back to your side while in a flat bed without using bedrails?: A Lot Help needed moving from lying on your back to sitting on the side of a flat bed without using bedrails?: A Lot Help needed moving to and from a bed to a chair (including a wheelchair)?: A Lot Help needed standing up from a chair using your arms (e.g., wheelchair or bedside chair)?: A Lot Help needed to walk in hospital  room?: Total Help needed climbing 3-5 steps with a railing? : Total 6 Click Score: 10    End of Session Equipment Utilized During Treatment: Gait belt Activity Tolerance: Patient tolerated treatment well Patient left: in chair;with call bell/phone within reach;with chair alarm set Nurse Communication: Mobility status       Time: [id number] PT Time Calculation (min) (ACUTE ONLY): 27 min  Charges:  $Therapeutic Exercise: 8-22 mins $Therapeutic Activity: 8-22 mins                     Chesley Noon, PTA 04/09/19, 11:47 AM

## 2019-04-09 NOTE — Progress Notes (Signed)
  Subjective: 2 Days Post-Op Procedure(s) (LRB): ARTHROPLASTY BIPOLAR HIP (HEMIARTHROPLASTY) (Left) Patient reports pain as mild.   Patient is resting, does appear more alert this morning when compared to yesterday. Plan is for d/c to SNF when appropriate. Negative for chest pain and shortness of breath Fever: no Gastrointestinal:Negative for nausea and vomiting  Objective: Vital signs in last 24 hours: Temp:  [97.7 F (36.5 C)-98.8 F (37.1 C)] 98.8 F (37.1 C) (09/10 0804) Pulse Rate:  [67-82] 73 (09/10 0804) Resp:  [15-18] 18 (09/10 0804) BP: (97-154)/(45-55) 154/52 (09/10 1017) SpO2:  [96 %-98 %] 98 % (09/10 0804)  Intake/Output from previous day:  Intake/Output Summary (Last 24 hours) at 04/09/2019 1200 Last data filed at 04/09/2019 0700 Gross per 24 hour  Intake 440 ml  Output 700 ml  Net -260 ml    Intake/Output this shift: No intake/output data recorded.  Labs: Recent Labs    04/07/19 0413 04/08/19 0312 04/09/19 0644  HGB 11.2* 9.3* 8.1*   Recent Labs    04/08/19 0312 04/09/19 0644  WBC 10.3 8.0  RBC 3.10* 2.63*  HCT 28.8* 25.3*  PLT 153 125*   Recent Labs    04/07/19 0413 04/09/19 0644  NA 139 141  K 4.4 4.4  CL 107 111  CO2 25 24  BUN 33* 45*  CREATININE 1.26* 1.50*  GLUCOSE 138* 102*  CALCIUM 9.3 8.5*   No results for input(s): LABPT, INR in the last 72 hours.   EXAM General - Patient is Alert and Appropriate Extremity - ABD soft Sensation intact distally Intact pulses distally Dorsiflexion/Plantar flexion intact Incision: dressing C/D/I No cellulitis present Dressing/Incision - clean, dry, no drainage Motor Function - intact, moving foot and toes well on exam.   Past Medical History:  Diagnosis Date  . Allergy   . CKD (chronic kidney disease)   . GERD (gastroesophageal reflux disease)   . Hyperlipidemia   . Hypertension     Assessment/Plan: 2 Days Post-Op Procedure(s) (LRB): ARTHROPLASTY BIPOLAR HIP (HEMIARTHROPLASTY)  (Left) Active Problems:   Closed left hip fracture (HCC)  Estimated body mass index is 21.27 kg/m as calculated from the following:   Height as of this encounter: 5' (1.524 m).   Weight as of this encounter: 49.4 kg. Advance diet Up with therapy   Labs reviewed this AM, Hg 8.1 this AM.  Continue to monitor. CBC and BMP ordered for tomorrow morning. Up with therapy today, current plan is for d/c to SNF when appropriate. Begin working on BM.  Patient will likely need SNF upon discharge.  DVT Prophylaxis - Lovenox, Foot Pumps and TED hose Weight-Bearing as tolerated to left leg  J. Cameron Proud, PA-C [facility] Orthopaedic Surgery 04/09/2019, 12:00 PM

## 2019-04-09 NOTE — Care Management Important Message (Signed)
Important Message  Patient Details  Name: Kimberly Abbott MRN: [id number] Date of Birth: 1920-11-18   Medicare Important Message Given:  Yes     [name] 04/09/2019, 1:28 PM

## 2019-04-09 NOTE — Progress Notes (Signed)
Pt unable to void thus far in shift. Bladder scan performed and revealed 630ml in urinary bladder. New order for 1 time order in and out for urinary retention Dr. Margaretmary Eddy.

## 2019-04-09 NOTE — Progress Notes (Signed)
Pt in and out catherized by this writer , tolerated well, 450 cc of amber colored urine with small amount of sediment noted to urinary collection tray. Remains on ABT for UTI. No adverse reaction after first dose. Daughter in room and educated about Cephalexin and why it was prescribed to pt . Encouraged PO fluids . Pt DTV btw 2000-2200. Pain controlled at this time resting in bed with eyes closed. Bed in lowest position

## 2019-04-09 NOTE — Progress Notes (Signed)
[facility] NAME: Kimberly Abbott    MR#:  [id number]  DATE OF BIRTH:  1921-01-17  SUBJECTIVE:  CHIEF COMPLAINT: Patient is out of bed to chair ,feels very tired  REVIEW OF SYSTEMS:  CONSTITUTIONAL: Reports fatigue or weakness.  EYES: No blurred or double vision.  EARS, NOSE, AND THROAT: No tinnitus or ear pain.  RESPIRATORY: No cough, shortness of breath, wheezing or hemoptysis.  CARDIOVASCULAR: No chest pain, orthopnea, edema.  GASTROINTESTINAL: No nausea, vomiting, diarrhea or abdominal pain.  GENITOURINARY: No dysuria, hematuria.  ENDOCRINE: No polyuria, nocturia,  HEMATOLOGY: No anemia, easy bruising or bleeding SKIN: No rash or lesion. MUSCULOSKELETAL: Left hip pain  nEUROLOGIC: No tingling, numbness, weakness.  PSYCHIATRY: No anxiety or depression.   DRUG ALLERGIES:   Allergies  Allergen Reactions  . Penicillin G Hives  . Penicillins Hives    Has patient had a PCN reaction causing immediate rash, facial/tongue/throat swelling, SOB or lightheadedness with hypotension: No Has patient had a PCN reaction causing severe rash involving mucus membranes or skin necrosis: No Has patient had a PCN reaction that required hospitalization No Has patient had a PCN reaction occurring within the last 10 years: No If all of the above answers are "NO", then may proceed with Cephalosporin use.    VITALS:  Blood pressure 122/89, pulse 79, temperature 98 F (36.7 C), temperature source Oral, resp. rate 18, height 5' (1.524 m), weight 49.4 kg, SpO2 95 %.  PHYSICAL EXAMINATION:  GENERAL:  86 y.o.-year-old patient lying in the bed with no acute distress.  EYES: Pupils equal, round, reactive to light and accommodation. No scleral icterus. Extraocular muscles intact.  HEENT: Head atraumatic, normocephalic. Oropharynx and nasopharynx clear.  NECK:  Supple, no jugular venous distention. No thyroid enlargement, no tenderness.  LUNGS:  Normal breath sounds bilaterally, no wheezing, rales,rhonchi or crepitation. No use of accessory muscles of respiration.  CARDIOVASCULAR: S1, S2 normal. No murmurs, rubs, or gallops.  ABDOMEN: Soft, nontender, nondistended. Bowel sounds present.  EXTREMITIES: Left hip is tender and externally rotated no pedal edema, cyanosis, or clubbing.  NEUROLOGIC: Cranial nerves II through XII are intact. Sensation intact. Gait not checked.  PSYCHIATRIC: The patient is alert and oriented x 3.  SKIN: No obvious rash, lesion, or ulcer.    LABORATORY PANEL:   CBC Recent Labs  Lab 04/09/19 0644  WBC 8.0  HGB 8.1*  HCT 25.3*  PLT 125*   ------------------------------------------------------------------------------------------------------------------  Chemistries  Recent Labs  Lab 04/07/19 0413 04/09/19 0644  NA 139 141  K 4.4 4.4  CL 107 111  CO2 25 24  GLUCOSE 138* 102*  BUN 33* 45*  CREATININE 1.26* 1.50*  CALCIUM 9.3 8.5*  MG 2.0  --    ------------------------------------------------------------------------------------------------------------------  Cardiac Enzymes No results for input(s): TROPONINI in the last 168 hours. ------------------------------------------------------------------------------------------------------------------  RADIOLOGY:  Dg Hip Unilat With Pelvis 2-3 Views Left  Result Date: 04/07/2019 CLINICAL DATA:  Postop EXAM: DG HIP (WITH OR WITHOUT PELVIS) 2-3V LEFT COMPARISON:  04/06/2019 FINDINGS: Changes of left hip replacement. Normal AP alignment. No hardware or bony complicating feature. Degenerative changes in the right hip. Diffuse vascular calcifications. IMPRESSION: Left hip replacement.  No complicating feature. Electronically Signed   By: Rolm Baptise M.D.   On: 04/07/2019 21:46    EKG:   Orders placed or performed during the hospital encounter of 04/06/19  . ED EKG  . ED EKG  . EKG 12-Lead  . EKG 12-Lead  ASSESSMENT AND PLAN:   Patient is a  85 year old female with history of hypertension, chronic kidney disease stage III, mild dementia gastroesophageal reflux disease admitted for left hip fracture following mechanical fall at home  1.  Left hip fracture Secondary to mechanical fall at home.  Patient fell on the left side.  Did not hit her head.  No loss of consciousness.  No neurological deficits. Postop day #2  patient is highly functional and lives independently at home such as at least 4 METS. Patient is seen by cardiology Dr. Clayborn Bigness and cleared for surgery . pain control as needed  physical therapy is recommending skilled nursing facility Hemoglobin 12.3-11.2-9.3 -8.1 we will continue close monitoring DVT prophylaxis with Lovenox subcu  2.  Chronic kidney disease stage III Renal function fairly stable Avoid nephrotoxins and check a.m. labs Creatinine at 1.26-1.5 at her baseline  3.    Urinary tract infection with leukocytosis and abnormal urinalysis l UA positive for leukocytes and nitrites Patient with leukocytosis  Patient is started on Keflex Urine culture and sensitivity  4.  Mild dementia Patient awake and alert and oriented.  Still able to make her own medical decisions as confirmed by daughter present at bedside.  Patient still lives independently at home. Stable   Patient skilled nursing facility Discussed with social worker anticipating discharge on Friday  All the records are reviewed and case discussed with Care Management/Social Workerr. Management plans discussed with the patient, family and they are in agreement.  CODE STATUS: dnr   TOTAL TIME TAKING CARE OF THIS PATIENT: minutes.   POSSIBLE D/C IN 1-  2-  DAYS, DEPENDING ON CLINICAL CONDITION.  Note: This dictation was prepared with Dragon dictation along with smaller phrase technology. Any transcriptional errors that result from this process are unintentional.   Nicholes Mango M.D on 04/09/2019 at 4:15 PM  Between 7am to 6pm - Pager -  [phone] After 6pm go to [email or web address] - password [email or web address] [facility]  Office  [phone]  CC: Primary care physician; Patient, No Pcp Per

## 2019-04-10 LAB — BASIC METABOLIC PANEL
Anion gap: 6 (ref 5–15)
BUN: 33 mg/dL — ABNORMAL HIGH (ref 8–23)
CO2: 27 mmol/L (ref 22–32)
Calcium: 8.9 mg/dL (ref 8.9–10.3)
Chloride: 110 mmol/L (ref 98–111)
Creatinine, Ser: 1.22 mg/dL — ABNORMAL HIGH (ref 0.44–1.00)
GFR calc Af Amer: 43 mL/min — ABNORMAL LOW (ref >60)
GFR calc non Af Amer: 37 mL/min — ABNORMAL LOW (ref >60)
Glucose, Bld: 116 mg/dL — ABNORMAL HIGH (ref 70–99)
Potassium: 4.9 mmol/L (ref 3.5–5.1)
Sodium: 143 mmol/L (ref 135–145)

## 2019-04-10 LAB — CBC
HCT: 27.6 % — ABNORMAL LOW (ref 36.0–46.0)
Hemoglobin: 8.6 g/dL — ABNORMAL LOW (ref 12.0–15.0)
MCH: 30.2 pg (ref 26.0–34.0)
MCHC: 31.2 g/dL (ref 30.0–36.0)
MCV: 96.8 fL (ref 80.0–100.0)
Platelets: 159 10*3/uL (ref 150–400)
RBC: 2.85 MIL/uL — ABNORMAL LOW (ref 3.87–5.11)
RDW: 13.7 % (ref 11.5–15.5)
WBC: 9.9 10*3/uL (ref 4.0–10.5)
nRBC: 0 % (ref 0.0–0.2)

## 2019-04-10 LAB — SARS CORONAVIRUS 2 BY RT PCR (HOSPITAL ORDER, PERFORMED IN ~~LOC~~ HOSPITAL LAB): SARS Coronavirus 2: NEGATIVE

## 2019-04-10 MED ORDER — ONDANSETRON HCL 4 MG PO TABS: 4.0000 mg | ORAL_TABLET | Freq: Four times a day (QID) | ORAL | 0 refills | Status: AC | PRN

## 2019-04-10 MED ORDER — SENNA 8.6 MG PO TABS: 1.0000 | ORAL_TABLET | Freq: Every day | ORAL | 0 refills | Status: AC

## 2019-04-10 MED ORDER — ENOXAPARIN SODIUM 30 MG/0.3ML ~~LOC~~ SOLN: 30.0000 mg | SUBCUTANEOUS | 0 refills | Status: AC

## 2019-04-10 MED ORDER — OXYCODONE HCL 5 MG/5ML PO SOLN: 2.5000 mg | Freq: Four times a day (QID) | ORAL | 0 refills | Status: AC | PRN

## 2019-04-10 MED ORDER — CEPHALEXIN 250 MG PO CAPS: 250.0000 mg | ORAL_CAPSULE | Freq: Two times a day (BID) | ORAL | 0 refills | Status: AC

## 2019-04-10 MED ORDER — DOCUSATE SODIUM 100 MG PO CAPS: 100.0000 mg | ORAL_CAPSULE | Freq: Two times a day (BID) | ORAL | 0 refills | Status: AC | PRN

## 2019-04-10 MED ORDER — HYDRALAZINE HCL 25 MG PO TABS
25.0000 mg | ORAL_TABLET | Freq: Once | ORAL | Status: AC
  Administered 2019-04-10: 25 mg via ORAL
  Filled 2019-04-10: qty 1

## 2019-04-10 MED ORDER — BISACODYL 10 MG RE SUPP
10.0000 mg | Freq: Once | RECTAL | Status: AC
  Administered 2019-04-10: 10 mg via RECTAL
  Filled 2019-04-10: qty 1

## 2019-04-10 MED ORDER — SORBITOL 70 % SOLN
960.0000 mL | TOPICAL_OIL | Freq: Once | ORAL | Status: AC
  Administered 2019-04-10: 960 mL via RECTAL
  Filled 2019-04-10: qty 240

## 2019-04-10 NOTE — Progress Notes (Signed)
  Subjective: 3 Days Post-Op Procedure(s) (LRB): ARTHROPLASTY BIPOLAR HIP (HEMIARTHROPLASTY) (Left) Patient reports pain as mild.   Patient is resting well but having issues with urinary retention. In and Out ordered last night. Plan is for d/c to SNF when appropriate. Negative for chest pain and shortness of breath Fever: no Gastrointestinal:Negative for nausea and vomiting  Objective: Vital signs in last 24 hours: Temp:  [98 F (36.7 C)-98.8 F (37.1 C)] 98.2 F (36.8 C) (09/10 2356) Pulse Rate:  [57-84] 84 (09/10 2356) Resp:  [16-18] 16 (09/10 2356) BP: (120-154)/(52-89) 144/61 (09/10 2356) SpO2:  [94 %-99 %] 94 % (09/10 2356)  Intake/Output from previous day:  Intake/Output Summary (Last 24 hours) at 04/10/2019 0754 Last data filed at 04/10/2019 0641 Gross per 24 hour  Intake -  Output 650 ml  Net -650 ml    Intake/Output this shift: No intake/output data recorded.  Labs: Recent Labs    04/08/19 0312 04/09/19 0644 04/10/19 0649  HGB 9.3* 8.1* 8.6*   Recent Labs    04/09/19 0644 04/10/19 0649  WBC 8.0 9.9  RBC 2.63* 2.85*  HCT 25.3* 27.6*  PLT 125* 159   Recent Labs    04/09/19 0644  NA 141  K 4.4  CL 111  CO2 24  BUN 45*  CREATININE 1.50*  GLUCOSE 102*  CALCIUM 8.5*   No results for input(s): LABPT, INR in the last 72 hours.   EXAM General - Patient is Alert and Appropriate Extremity - ABD soft Sensation intact distally Intact pulses distally Dorsiflexion/Plantar flexion intact Incision: dressing C/D/I No cellulitis present Dressing/Incision - clean, dry, no drainage Motor Function - intact, moving foot and toes well on exam.   Past Medical History:  Diagnosis Date  . Allergy   . CKD (chronic kidney disease)   . GERD (gastroesophageal reflux disease)   . Hyperlipidemia   . Hypertension     Assessment/Plan: 3 Days Post-Op Procedure(s) (LRB): ARTHROPLASTY BIPOLAR HIP (HEMIARTHROPLASTY) (Left) Active Problems:   Closed left hip  fracture (HCC)  Estimated body mass index is 21.27 kg/m as calculated from the following:   Height as of this encounter: 5' (1.524 m).   Weight as of this encounter: 49.4 kg. Advance diet Up with therapy   Labs reviewed this AM, Hg 8.6 this AM.  Continue to monitor. Continue to monitor urinary output, currently on Abx for UTI.   May need to discharge with foley cath. Continue to work on having a BM. Plan for discharge to SNF when appropriate.  Upon discharge from the hospital, continue Lovenox 40mg  daily for 14 days. Follow-up with [facility] in two weeks for staple removal.  DVT Prophylaxis - Lovenox, Foot Pumps and TED hose Weight-Bearing as tolerated to left leg  J. Cameron Proud, PA-C [facility] 04/10/2019, 7:54 AM

## 2019-04-10 NOTE — Discharge Summary (Signed)
[facility] [place] NAME: Kimberly Abbott    MR#:  [id number]  DATE OF BIRTH:  November 24, 1920  DATE OF ADMISSION:  04/06/2019 ADMITTING PHYSICIAN: Otila Back, MD  DATE OF DISCHARGE: 04/10/19  PRIMARY CARE PHYSICIAN: Patient, No Pcp Per    ADMISSION DIAGNOSIS:  Fall [W19.XXXA] Closed fracture of left hip, initial encounter ([place]) [S72.002A]  DISCHARGE DIAGNOSIS:  Active Problems:   Closed left hip fracture ([place])   SECONDARY DIAGNOSIS:   Past Medical History:  Diagnosis Date  . Allergy   . CKD (chronic kidney disease)   . GERD (gastroesophageal reflux disease)   . Hyperlipidemia   . Hypertension     HOSPITAL COURSE:   hpi  Kimberly Abbott  is a 85 y.o. female with a known history of hypertension, chronic kidney disease stage III, gastroesophageal reflux disease, mild dementia and hyperlipidemia who is still very highly functional and lives independently at home but had a mechanical fall at home.  Patient presented with left hip pain.  Fell on the left side of the hip.  Did not hit her head.  No loss of consciousness.  Patient was evaluated in the emergency room and found to have mild leukocytosis with white count of 12.9.  No fevers.  No chest pain.  No shortness of breath.  No pro history of MI.  No prior respiratory issues.  Imaging studies reviewed left hip fracture.  Emergency room provider discussed case with orthopedic physician Dr. Roland Rack who is planning on surgery tomorrow.  Medical service called to admit patient for further evaluation.  1.Left hip fracture Secondary to mechanical fall at home. Patient fell on the left side. Did not hit her head. No loss of consciousness. No neurological deficits. patient is highly functional and lives independently at home such as at least 4 METS. Patient is seen by cardiology Dr. Clayborn Bigness and cleared for surgery. pain control as needed  physical therapy is recommending skilled nursing  facility Hemoglobin 12.3-11.2-9.3 -8.1-8.6 we will continue close monitoring DVT prophylaxis with Lovenox subcu for total 14 days Outpatient follow-up with orthopedics as per recommendation Patient can be discharged with Foley catheter  2.Chronic kidney disease stage III Renal function fairly stable Avoid nephrotoxins and check a.m. labs Creatinine at 1.26-1.5-1.2 at her baseline  3.  Urinary tract infection with leukocytosis and abnormal urinalysis l UA positive for leukocytes and nitrites Patient with leukocytosis  Patient is started on Keflex Urine culture and sensitivity pending blood loss 1  4.Mild dementia Patient awake and alert and oriented. Still able to make her own medical decisions as confirmed by daughter present at bedside. Patient still lives independently at home. Stable   Patient skilled nursing facility   DISCHARGE CONDITIONS:     CONSULTS OBTAINED:  Treatment Team:  Corky Mull, MD   PROCEDURES left hip surgery  DRUG ALLERGIES:   Allergies  Allergen Reactions  . Penicillin G Hives  . Penicillins Hives    Has patient had a PCN reaction causing immediate rash, facial/tongue/throat swelling, SOB or lightheadedness with hypotension: No Has patient had a PCN reaction causing severe rash involving mucus membranes or skin necrosis: No Has patient had a PCN reaction that required hospitalization No Has patient had a PCN reaction occurring within the last 10 years: No If all of the above answers are "NO", then may proceed with Cephalosporin use.    DISCHARGE MEDICATIONS:   Allergies as of 04/10/2019      Reactions  Penicillin G Hives   Penicillins Hives   Has patient had a PCN reaction causing immediate rash, facial/tongue/throat swelling, SOB or lightheadedness with hypotension: No Has patient had a PCN reaction causing severe rash involving mucus membranes or skin necrosis: No Has patient had a PCN reaction that required  hospitalization No Has patient had a PCN reaction occurring within the last 10 years: No If all of the above answers are "NO", then may proceed with Cephalosporin use.      Medication List    STOP taking these medications   metoprolol tartrate 25 MG tablet Commonly known as: LOPRESSOR     TAKE these medications   acetaminophen 500 MG tablet Commonly known as: TYLENOL Take 2 tablets (1,000 mg total) 2 (two) times daily as needed by mouth.   allopurinol 100 MG tablet Commonly known as: ZYLOPRIM Take 1 tablet (100 mg total) by mouth daily.   amLODipine 5 MG tablet Commonly known as: NORVASC Take 5 mg by mouth daily. for high blood pressure   aspirin 81 MG chewable tablet Chew 81 mg by mouth daily.   betamethasone dipropionate 0.05 % cream Commonly known as: DIPROLENE Apply topically 2 (two) times daily.   cephALEXin 250 MG capsule Commonly known as: KEFLEX Take 1 capsule (250 mg total) by mouth every 12 (twelve) hours.   cetirizine 5 MG tablet Commonly known as: ZYRTEC Take 1 tablet (5 mg total) by mouth daily.   clobetasol ointment 0.05 % Commonly known as: Temovate Apply 1 application topically 2 (two) times daily.   clotrimazole-betamethasone cream Commonly known as: LOTRISONE APPLY  CREAM TOPICALLY TWICE DAILY   docusate sodium 100 MG capsule Commonly known as: COLACE Take 1 capsule (100 mg total) by mouth 2 (two) times daily as needed for mild constipation.   enoxaparin 30 MG/0.3ML injection Commonly known as: LOVENOX Inject 0.3 mLs (30 mg total) into the skin daily for 14 days. Start taking on: April 11, 2019   escitalopram 20 MG tablet Commonly known as: LEXAPRO Take 20 mg by mouth daily.   feeding supplement (ENSURE ENLIVE) Liqd Take 237 mLs by mouth 2 (two) times daily between meals.   mirtazapine 15 MG tablet Commonly known as: REMERON Take 1 tablet (15 mg total) by mouth at bedtime.   ondansetron 4 MG tablet Commonly known as:  ZOFRAN Take 1 tablet (4 mg total) by mouth every 6 (six) hours as needed for nausea.   oxybutynin 5 MG 24 hr tablet Commonly known as: DITROPAN-XL Take 5 mg by mouth daily.   oxyCODONE 5 MG/5ML solution Commonly known as: ROXICODONE Take 2.5-5 mLs (2.5-5 mg total) by mouth every 6 (six) hours as needed for moderate pain or severe pain.   senna 8.6 MG Tabs tablet Commonly known as: SENOKOT Take 1 tablet (8.6 mg total) by mouth daily. Start taking on: April 11, 2019   simvastatin 20 MG tablet Commonly known as: ZOCOR Take 20 mg by mouth daily at 6 PM.   triamcinolone ointment 0.1 % Commonly known as: KENALOG Apply twice a day to affected areas for back.        DISCHARGE INSTRUCTIONS:   Follow-up with primary care physician at the facility in 3 days.  Follow-up on the pending urinary culture and sensitivity Follow-up with orthopedics in 2  weeks  DIET:  Dysphagia 3 diet no straws  DISCHARGE CONDITION:  Fair  ACTIVITY:  Activity as tolerated per PT  OXYGEN:  Home Oxygen: No.   Oxygen Delivery: room air  DISCHARGE LOCATION:  nursing home   If you experience worsening of your admission symptoms, develop shortness of breath, life threatening emergency, suicidal or homicidal thoughts you must seek medical attention immediately by calling 911 or calling your MD immediately  if symptoms less severe.  You Must read complete instructions/literature along with all the possible adverse reactions/side effects for all the Medicines you take and that have been prescribed to you. Take any new Medicines after you have completely understood and accpet all the possible adverse reactions/side effects.   Please note  You were cared for by a hospitalist during your hospital stay. If you have any questions about your discharge medications or the care you received while you were in the hospital after you are discharged, you can call the unit and asked to speak with the hospitalist  on call if the hospitalist that took care of you is not available. Once you are discharged, your primary care physician will handle any further medical issues. Please note that NO REFILLS for any discharge medications will be authorized once you are discharged, as it is imperative that you return to your primary care physician (or establish a relationship with a primary care physician if you do not have one) for your aftercare needs so that they can reassess your need for medications and monitor your lab values.     Today  Chief Complaint  Patient presents with  . Hip Pain   Patient is doing fine.  Pain is manageable.  Daughter at bedside.  Agreeable to go to rehab center  ROS:  CONSTITUTIONAL: Denies fevers, chills. Denies any fatigue, weakness.  EYES: Denies blurry vision, double vision, eye pain. EARS, NOSE, THROAT: Denies tinnitus, ear pain, hearing loss. RESPIRATORY: Denies cough, wheeze, shortness of breath.  CARDIOVASCULAR: Denies chest pain, palpitations, edema.  GASTROINTESTINAL: Denies nausea, vomiting, diarrhea, abdominal pain. Denies bright red blood per rectum. GENITOURINARY: Denies dysuria, hematuria. ENDOCRINE: Denies nocturia or thyroid problems. HEMATOLOGIC AND LYMPHATIC: Denies easy bruising or bleeding. SKIN: Denies rash or lesion. MUSCULOSKELETAL: Denies pain in neck, back, shoulder, knees, hips or arthritic symptoms.  NEUROLOGIC: Denies paralysis, paresthesias.  PSYCHIATRIC: Denies anxiety or depressive symptoms.   VITAL SIGNS:  Blood pressure (!) 144/48, pulse 79, temperature 98 F (36.7 C), resp. rate 16, height 5' (1.524 m), weight 49.4 kg, SpO2 99 %.  I/O:    Intake/Output Summary (Last 24 hours) at 04/10/2019 1103 Last data filed at 04/10/2019 0641 Gross per 24 hour  Intake -  Output 650 ml  Net -650 ml    PHYSICAL EXAMINATION:  GENERAL:  85 y.o.-year-old patient lying in the bed with no acute distress.  EYES: Pupils equal, round, reactive to light  and accommodation. No scleral icterus. Extraocular muscles intact.  HEENT: Head atraumatic, normocephalic. Oropharynx and nasopharynx clear.  NECK:  Supple, no jugular venous distention. No thyroid enlargement, no tenderness.  LUNGS: Normal breath sounds bilaterally, no wheezing, rales,rhonchi or crepitation. No use of accessory muscles of respiration.  CARDIOVASCULAR: S1, S2 normal. No murmurs, rubs, or gallops.  ABDOMEN: Soft, non-tender, non-distended. Bowel sounds present.  EXTREMITIES: Left hip area with dressing  no pedal edema, cyanosis, or clubbing.  NEUROLOGIC: Cranial nerves II through XII are intact. Sensation intact. Gait not checked.  PSYCHIATRIC: The patient is alert and oriented x 3.  SKIN: No obvious rash, lesion, or ulcer.   DATA REVIEW:   CBC Recent Labs  Lab 04/10/19 0649  WBC 9.9  HGB 8.6*  HCT 27.6*  PLT 159  Chemistries  Recent Labs  Lab 04/07/19 0413  04/10/19 0649  NA 139   < > 143  K 4.4   < > 4.9  CL 107   < > 110  CO2 25   < > 27  GLUCOSE 138*   < > 116*  BUN 33*   < > 33*  CREATININE 1.26*   < > 1.22*  CALCIUM 9.3   < > 8.9  MG 2.0  --   --    < > = values in this interval not displayed.    Cardiac Enzymes No results for input(s): TROPONINI in the last 168 hours.  Microbiology Results  Results for orders placed or performed during the hospital encounter of 04/06/19  SARS Coronavirus 2 [facility] order, Performed in [facility] hospital lab) Nasopharyngeal Nasal Mucosa     Status: None   Collection Time: 04/06/19 11:41 AM   Specimen: Nasal Mucosa; Nasopharyngeal  Result Value Ref Range Status   SARS Coronavirus 2 NEGATIVE NEGATIVE Final    Comment: (NOTE) If result is NEGATIVE SARS-CoV-2 target nucleic acids are NOT DETECTED. The SARS-CoV-2 RNA is generally detectable in upper and lower  respiratory specimens during the acute phase of infection. The lowest  concentration of SARS-CoV-2 viral copies this assay can detect is 250   copies / mL. A negative result does not preclude SARS-CoV-2 infection  and should not be used as the sole basis for treatment or other  patient management decisions.  A negative result may occur with  improper specimen collection / handling, submission of specimen other  than nasopharyngeal swab, presence of viral mutation(s) within the  areas targeted by this assay, and inadequate number of viral copies  (<250 copies / mL). A negative result must be combined with clinical  observations, patient history, and epidemiological information. If result is POSITIVE SARS-CoV-2 target nucleic acids are DETECTED. The SARS-CoV-2 RNA is generally detectable in upper and lower  respiratory specimens dur ing the acute phase of infection.  Positive  results are indicative of active infection with SARS-CoV-2.  Clinical  correlation with patient history and other diagnostic information is  necessary to determine patient infection status.  Positive results do  not rule out bacterial infection or co-infection with other viruses. If result is PRESUMPTIVE POSTIVE SARS-CoV-2 nucleic acids MAY BE PRESENT.   A presumptive positive result was obtained on the submitted specimen  and confirmed on repeat testing.  While 2019 novel coronavirus  (SARS-CoV-2) nucleic acids may be present in the submitted sample  additional confirmatory testing may be necessary for epidemiological  and / or clinical management purposes  to differentiate between  SARS-CoV-2 and other Sarbecovirus currently known to infect humans.  If clinically indicated additional testing with an alternate test  methodology [phone]) is advised. The SARS-CoV-2 RNA is generally  detectable in upper and lower respiratory sp ecimens during the acute  phase of infection. The expected result is Negative. Fact Sheet for Patients:  StrictlyIdeas.no Fact Sheet for Healthcare  Providers: BankingDealers.co.za This test is not yet approved or cleared by the [place] FDA and has been authorized for detection and/or diagnosis of SARS-CoV-2 by FDA under an Emergency Use Authorization (EUA).  This EUA will remain in effect (meaning this test can be used) for the duration of the COVID-19 declaration under Section 564(b)(1) of the Act, 21 U.S.C. section 360bbb-3(b)(1), unless the authorization is terminated or revoked sooner. Performed at [facility], [address] [place], [place] [address]  Surgical pcr screen     Status: None   Collection Time: 04/06/19  8:21 PM   Specimen: Nasal Mucosa; Nasal Swab  Result Value Ref Range Status   MRSA, PCR NEGATIVE NEGATIVE Final   Staphylococcus aureus NEGATIVE NEGATIVE Final    Comment: (NOTE) The Xpert SA Assay (FDA approved for NASAL specimens in patients 47 years of age and older), is one component of a comprehensive surveillance program. It is not intended to diagnose infection nor to guide or monitor treatment. Performed at [facility], [address] [place], [place] [address]     RADIOLOGY:  Dg Chest 1 View  Result Date: 04/06/2019 CLINICAL DATA:  Preoperative radiograph EXAM: CHEST  1 VIEW COMPARISON:  CT angiogram of the chest September 05, 2016 FINDINGS: The cardiac silhouette is mildly enlarged. Mediastinal contours appear intact. Calcific atherosclerotic disease and tortuosity of the aorta There is no evidence of focal airspace consolidation, pleural effusion or pneumothorax. Osseous structures are without acute abnormality. Soft tissues are grossly normal. IMPRESSION: 1. Mildly enlarged cardiac silhouette. 2. Calcific atherosclerotic disease and tortuosity of the aorta. Electronically Signed   By: Fidela Salisbury M.D.   On: 04/06/2019 12:30   Dg Hip Unilat With Pelvis 2-3 Views Left  Result Date: 04/07/2019 CLINICAL DATA:  Postop EXAM: DG HIP (WITH OR WITHOUT  PELVIS) 2-3V LEFT COMPARISON:  04/06/2019 FINDINGS: Changes of left hip replacement. Normal AP alignment. No hardware or bony complicating feature. Degenerative changes in the right hip. Diffuse vascular calcifications. IMPRESSION: Left hip replacement.  No complicating feature. Electronically Signed   By: Rolm Baptise M.D.   On: 04/07/2019 21:46   Dg Hip Unilat With Pelvis 2-3 Views Left  Result Date: 04/06/2019 CLINICAL DATA:  Left hip pain after fall today.  Initial encounter. EXAM: DG HIP (WITH OR WITHOUT PELVIS) 2-3V LEFT COMPARISON:  None. FINDINGS: The patient has an acute subcapital fracture of the left hip. No other acute abnormality is identified. Bones are osteopenic. Lower lumbar spondylosis noted. Atherosclerosis is seen. IMPRESSION: Acute subcapital fracture left hip. Electronically Signed   By: Inge Rise M.D.   On: 04/06/2019 12:28    EKG:   Orders placed or performed during the hospital encounter of 04/06/19  . ED EKG  . ED EKG  . EKG 12-Lead  . EKG 12-Lead      Management plans discussed with the patient, family and they are in agreement.  CODE STATUS:     Code Status Orders  (From admission, onward)         Start     Ordered   04/06/19 1447  Do not attempt resuscitation (DNR)  Continuous    Question Answer Comment  In the event of cardiac or respiratory ARREST Do not call a "code blue"   In the event of cardiac or respiratory ARREST Do not perform Intubation, CPR, defibrillation or ACLS   In the event of cardiac or respiratory ARREST Use medication by any route, position, wound care, and other measures to relive pain and suffering. May use oxygen, suction and manual treatment of airway obstruction as needed for comfort.   Comments Patient clearly wishes to be DNR/DNI.  Daughter at bedside agrees with her decision.      04/06/19 1447        Code Status History    Date Active Date Inactive Code Status Order ID Comments User Context   05/01/2017 0210  05/02/2017 1957 Full Code [id number]  Demetrios Loll, MD Inpatient  03/06/2017 2033 03/20/2017 1753 Full Code [id number]  Demetrios Loll, MD Inpatient   06/08/2016 1137 06/10/2016 1553 Full Code [id number]  Fritzi Mandes, MD Inpatient   Advance Care Planning Activity      TOTAL TIME TAKING CARE OF THIS PATIENT: 45 minutes.   Note: This dictation was prepared with Dragon dictation along with smaller phrase technology. Any transcriptional errors that result from this process are unintentional.   @[facility] @  on 04/10/2019 at 11:03 AM  Between 7am to 6pm - Pager - [phone]  After 6pm go to [email or web address] - password [email or web address] [facility]  Office  [phone]  CC: Primary care physician; Patient, No Pcp Per

## 2019-04-10 NOTE — TOC Transition Note (Signed)
Transition of Care [facility]) - CM/SW Discharge Note   Patient Details  Name: Kimberly Abbott MRN: [id number] Date of Birth: 1921/07/21  [facility] CM/SW Contact:  Austen Oyster, Lenice Llamas Phone Number: [phone]  04/10/2019, 3:18 PM   Clinical Narrative: Patient is medically stable for D/C to [facility] today. Per Tammy admissions coordinator at [facility] authorization has been received and patient can come today to room 803. Patient had negative covid test today 9/11. RN will call report and arrange EMS for transport. Clinical Education officer, museum (CSW) sent D/C orders to [facility] via [facility]. CSW contacted patient's daughter Nekeia and made her aware of above. Please reconsult if future social work needs arise. CSW signing off.      Final next level of care: Skilled Nursing Facility Barriers to Discharge: Barriers Resolved   Patient Goals and CMS Choice Patient states their goals for this hospitalization and ongoing recovery are:: Improve mobility CMS Medicare.gov Compare Post Acute Care list provided to:: Patient Represenative (must comment)(Daughter Bessie) Choice offered to / list presented to : Adult Children  Discharge Placement PASRR number recieved: 04/07/19            Patient chooses bed at: [facility] [place] Patient to be transferred to facility by: [facility] Name of family member notified: Patient's daugther Jayleana is aware of D/C today. Patient and family notified of of transfer: 04/10/19  Discharge Plan and Services In-house Referral: Clinical Social Work Discharge Planning Services: CM Consult Post Acute Care Choice: [place]          DME Arranged: N/A         HH Arranged: NA          Social Determinants of Health (SDOH) Interventions     Readmission Risk Interventions No flowsheet data found.

## 2019-04-10 NOTE — Progress Notes (Signed)
Physical Therapy Treatment Patient Details Name: Kimberly Abbott MRN: [id number] DOB: 1920/11/06 Today's Date: 04/10/2019    History of Present Illness presented to ER secondary to mechanical fall with acute onset of L hip pain; admitted for management of L hip fracture, s/p L hip hemiarthroplasty (04/07/19), WBAT.    PT Comments    Participated in exercises as described below.  To edge of bed with mod a x 1.  Transferred to commode to void-> bed->recliner with mod a x 1 stand pivot.  Walker not used due to tight spaces and inability to effectively turn walker.  Remained in recliner after session with family in room.     Follow Up Recommendations  SNF     Equipment Recommendations  Rolling walker with 5" wheels    Recommendations for Other Services       Precautions / Restrictions Precautions Precautions: Fall;Posterior Hip Restrictions Weight Bearing Restrictions: Yes RLE Weight Bearing: Weight bearing as tolerated    Mobility  Bed Mobility Overal bed mobility: Needs Assistance Bed Mobility: Supine to Sit     Supine to sit: Mod assist        Transfers Overall transfer level: Needs assistance Equipment used: None Transfers: Stand Pivot Transfers Sit to Stand: Mod assist         General transfer comment: transferred x 3 during session.  stnad pivot used due to tight spaces and limited room to use walker.  Ambulation/Gait             General Gait Details: tolerated transfer to chair today with minimal dizziness noted   Stairs             Wheelchair Mobility    Modified Rankin (Stroke Patients Only)       Balance Overall balance assessment: Needs assistance Sitting-balance support: No upper extremity supported;Feet supported Sitting balance-Leahy Scale: Good     Standing balance support: Bilateral upper extremity supported Standing balance-Leahy Scale: Poor                              Cognition Arousal/Alertness:  Awake/alert Behavior During Therapy: WFL for tasks assessed/performed Overall Cognitive Status: Within Functional Limits for tasks assessed                                 General Comments: unsure of why her hip hurts      Exercises Other Exercises Other Exercises: Supine L LE therex, 1x10, act assist ROM: ankle pumps, quad sets, SAQs, heel slides, hip abduct/adduct.  . Other Exercises: to commode to void    General Comments        Pertinent Vitals/Pain Pain Assessment: Faces Faces Pain Scale: Hurts little more Pain Location: L hip - generally improved comfort today with movement Pain Descriptors / Indicators: Sore;Aching;Discomfort Pain Intervention(s): Limited activity within patient's tolerance;Monitored during session    Home Living                      Prior Function            PT Goals (current goals can now be found in the care plan section) Progress towards PT goals: Progressing toward goals    Frequency    7X/week      PT Plan Current plan remains appropriate    Co-evaluation  AM-PAC PT "6 Clicks" Mobility   Outcome Measure  Help needed turning from your back to your side while in a flat bed without using bedrails?: A Lot Help needed moving from lying on your back to sitting on the side of a flat bed without using bedrails?: A Lot Help needed moving to and from a bed to a chair (including a wheelchair)?: A Lot Help needed standing up from a chair using your arms (e.g., wheelchair or bedside chair)?: A Lot Help needed to walk in hospital room?: Total Help needed climbing 3-5 steps with a railing? : Total 6 Click Score: 10    End of Session Equipment Utilized During Treatment: Gait belt Activity Tolerance: Patient tolerated treatment well Patient left: in chair;with call bell/phone within reach;with chair alarm set;with family/visitor present         Time: [id number] PT Time Calculation (min) (ACUTE  ONLY): 25 min  Charges:  $Therapeutic Exercise: 8-22 mins $Therapeutic Activity: 8-22 mins                    Chesley Noon, PTA 04/10/19, 11:57 AM

## 2019-04-10 NOTE — Discharge Instructions (Signed)
Follow-up with primary care physician at the facility in 3 days.  Follow-up on the pending urinary culture and sensitivity Follow-up with orthopedics in 3 to 4 weeks

## 2019-04-10 NOTE — Progress Notes (Signed)
BP rechecked 153/62. [facility] called. Will call transport to complete transport to [facility].

## 2019-04-10 NOTE — Progress Notes (Signed)
Pt Bp 180/66 one time order of hydralazine 25mg  PO V.O Dr. Margaretmary Eddy. , given simvastatin pt has severe dysphagia and coughed up a copious amt of medication  And thick clear mucous into a napkin. Night shift will reassess BP and then transfer can be completed to [facility]

## 2019-04-10 NOTE — Progress Notes (Signed)
Occupational Therapy Treatment Patient Details Name: Kimberly Abbott MRN: [id number] DOB: 1921-04-04 Today's Date: 04/10/2019    History of present illness presented to ER secondary to mechanical fall with acute onset of L hip pain; admitted for management of L hip fracture, s/p L hip hemiarthroplasty (04/07/19), WBAT.   OT comments  Ms. Sirmon was seen for OT treatment on this date. Upon arrival to room pt awake/alert, seated upright in room recliner finishing with lunch tray. Pt A&O x4 reporting mild discomfort in her L hip and fatigue from being unable to sleep well last night.  Pt agreeable to OT tx. Pt instructed in safe use of AE for LB ADL management as well as provided with reinforcement of prior education on her posterior hip precautions. Pt continues to have limited recall of posterior hip precautions and requires heavy multimodal cueing in order to return verbalize or return demonstrate during functional tasks. With cueing, she was able to utilize the sock aid to don a sock on this date (see ADL section below for further details). Pt making progress toward goals and continues to benefit from skilled OT services to maximize return to PLOF and minimize risk of future falls, injury, caregiver burden, and readmission. Will continue to follow POC as written. Discharge recommendation remains appropriate.    Follow Up Recommendations  SNF    Equipment Recommendations  3 in 1 bedside commode    Recommendations for Other Services      Precautions / Restrictions Precautions Precautions: Fall;Posterior Hip Precaution Booklet Issued: No Restrictions Weight Bearing Restrictions: Yes LLE Weight Bearing: Weight bearing as tolerated       Mobility Bed Mobility               General bed mobility comments: Deferred, pt up in recliner at start/end of session.  Transfers Overall transfer level: Needs assistance     Sit to Stand: Mod assist              Balance Overall balance  assessment: Needs assistance Sitting-balance support: No upper extremity supported;Feet supported Sitting balance-Leahy Scale: Good Sitting balance - Comments: Steady sitting, reaching within BOS. Good static and dynamic sitting balance during functional activity. Attempted to cross legs 1 time while seated during session. Easily corrected for adherence to posterior hip precautions.   Standing balance support: Bilateral upper extremity supported Standing balance-Leahy Scale: Poor                             ADL either performed or assessed with clinical judgement   ADL Overall ADL's : Needs assistance/impaired Eating/Feeding: Set up;Supervision/ safety Eating/Feeding Details (indicate cue type and reason): Pt on modified diet per SLP, was observed to have wet vocal quality and cough while eating/drinking from lunch tray on this date. RN aware. Grooming: Set up;Supervision/safety;Sitting   Upper Body Bathing: Sitting;Minimal assistance;Set up   Lower Body Bathing: Sitting/lateral leans;Moderate assistance;Set up   Upper Body Dressing : Minimal assistance;Sitting;Set up   Lower Body Dressing: Sit to/from stand;Moderate assistance;Cueing for safety Lower Body Dressing Details (indicate cue type and reason): Pt attempted trial of sock aid on non-operative leg on this date. She required consistent cueing for safety and adhenrece to posterior hip precautions. She was able to don sock with cueing for safety and technique. Toilet Transfer: RW;Stand-pivot;BSC;Moderate IT trainer and Hygiene: Sit to/from stand;Moderate assistance         General ADL Comments: Pt continues to  require consistent cueing for hip precautions t/o ADL mgt. Has very limited recall of posterior hip precautions even immediately after education/discussion.     Vision Baseline Vision/History: Wears glasses Wears Glasses: At all times Patient Visual Report: No change from  baseline     Perception     Praxis      Cognition Arousal/Alertness: Awake/alert Behavior During Therapy: WFL for tasks assessed/performed Overall Cognitive Status: Within Functional Limits for tasks assessed                                 General Comments: Pt stating she "just felt tired today" multiple times t/o session. Able to attend well to education, but unable to recall hip precautions even immediately after hearing them.        Exercises Other Exercises Other Exercises: Pt educated in adherence to posterior hip precautions during ADL tasks as well as safe use of AE to support ADL mgt. Pt able to return demonstrate use of the sock-aid to don a sock on her non-operative leg this date. Would benefit from further reinforcement of posterior hip precautions and ADL training.   Shoulder Instructions       General Comments      Pertinent Vitals/ Pain       Faces Pain Scale: Hurts little more Pain Location: L hip Pain Descriptors / Indicators: Sore;Aching;Discomfort Pain Intervention(s): Limited activity within patient's tolerance;Monitored during session;Repositioned  Home Living                                          Prior Functioning/Environment              Frequency  Min 1X/week        Progress Toward Goals  OT Goals(current goals can now be found in the care plan section)  Progress towards OT goals: Progressing toward goals  Acute Rehab OT Goals Patient Stated Goal: to transition to rehab at discharge OT Goal Formulation: With patient Time For Goal Achievement: 04/22/19 Potential to Achieve Goals: Good  Plan Discharge plan remains appropriate;Frequency remains appropriate    Co-evaluation                 AM-PAC OT "6 Clicks" Daily Activity     Outcome Measure   Help from another person eating meals?: A Little Help from another person taking care of personal grooming?: A Little Help from another person  toileting, which includes using toliet, bedpan, or urinal?: A Lot Help from another person bathing (including washing, rinsing, drying)?: A Lot Help from another person to put on and taking off regular upper body clothing?: A Little Help from another person to put on and taking off regular lower body clothing?: A Lot 6 Click Score: 15    End of Session Equipment Utilized During Treatment: Other (comment)(reacher, sock-aid)  OT Visit Diagnosis: Other abnormalities of gait and mobility (R26.89);History of falling (Z91.81);Pain Pain - Right/Left: Left Pain - part of body: Hip   Activity Tolerance Patient tolerated treatment well   Patient Left in chair;with call bell/phone within reach;with chair alarm set   Nurse Communication          Time: 1411-1436 OT Time Calculation (min): 25 min  Charges: OT General Charges $OT Visit: 1 Visit OT Treatments $Self Care/Home Management : 23-37 mins  Shara Blazing, M.S.,  OTR/L Ascom: [phone] 04/10/19, 4:24 PM

## 2019-04-10 NOTE — Progress Notes (Signed)
Pt had small BM , report called to [place] at [facility].  Ems called and awaiting pick up

## 2019-04-12 LAB — URINE CULTURE
Culture: NO GROWTH
Special Requests: NORMAL

## 2019-05-09 IMAGING — RF DG ESOPHAGUS
12 of 13 series · 14 of 24 positions shown · non-contrast
Comparison: No recent barium swallow studies in PACs. CT scan of
the chest of September 05, 2016

CLINICAL DATA: History of gastroesophageal reflux, nausea,
vomiting, and abdominal pain. Possible aspiration.

EXAM:
ESOPHOGRAM/BARIUM SWALLOW
TECHNIQUE: Single contrast examination was performed using thin barium or water
soluble. A barium tablet was administered.
FLUOROSCOPY TIME:  Fluoroscopy Time:  2 minutes, 30 seconds
Radiation Exposure Index (if provided by the fluoroscopic device):
7026 micro Gy per meter squared
Number of Acquired Spot Images:  2 +multiple video loops.

[Series 1: fluoro_barium 2fps_bw · 0.17mm/px · 1 of 3 slices shown (1 of 12)]
[im 1/3]
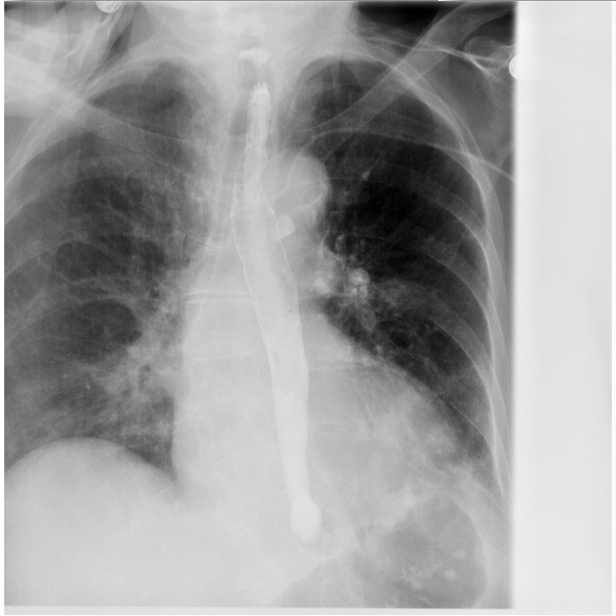

[Series 3: fluoro_barium 2fps_bw · 0.17mm/px · 1 of 7 frames shown (2 of 12)]
[frame 2/7]
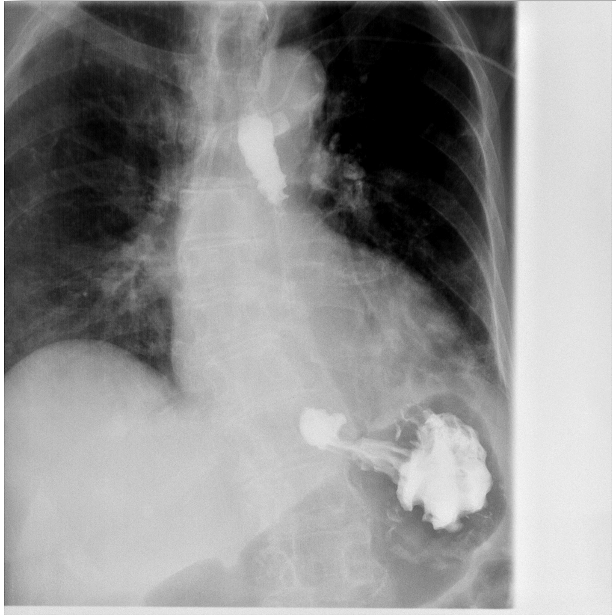

[Series 4: fluoro_barium 2fps_bw · 0.17mm/px · 1 of 20 frames shown (3 of 12)]
[frame 11/20]
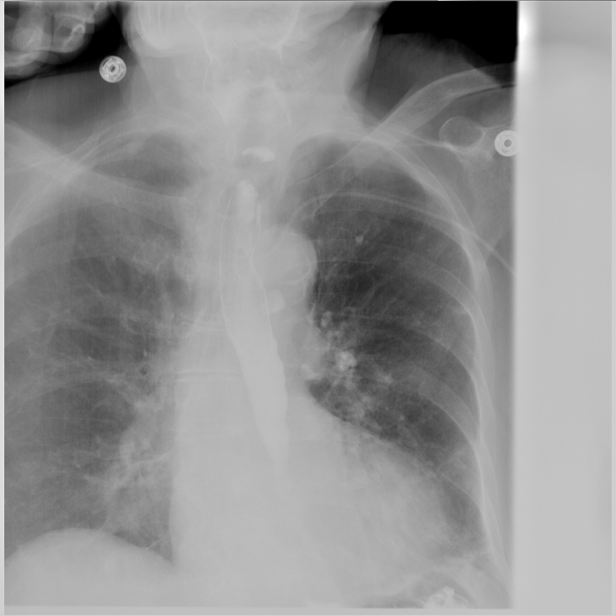

[Series 5: fluoro_barium 2fps_bw · 0.17mm/px · 2 of 19 frames shown (4 of 12)]
[frame 3/19]
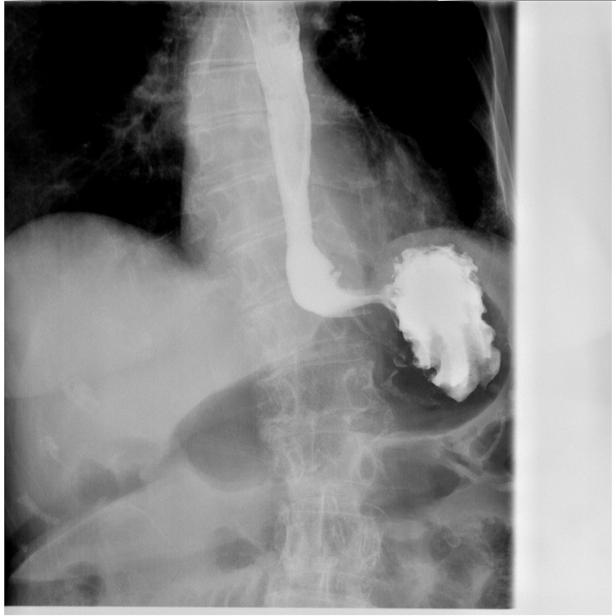
[frame 17/19]
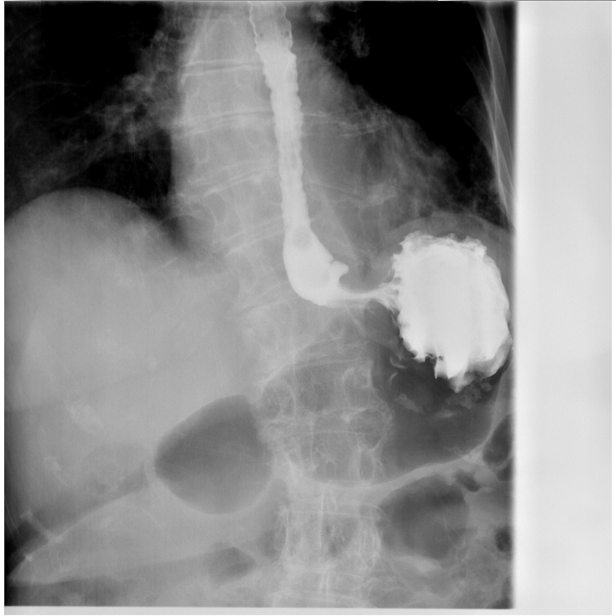

[Series 6: fluoro_barium 2fps_bw · 0.17mm/px · 1 of 20 frames shown (5 of 12)]
[frame 4/20]
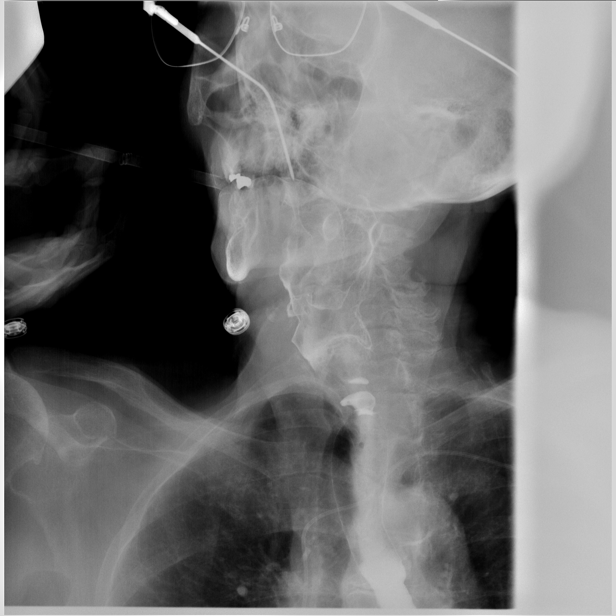

[Series 7: fluoro_barium 2fps_bw · 0.17mm/px · 2 of 20 frames shown (6 of 12)]
[frame 4/20]
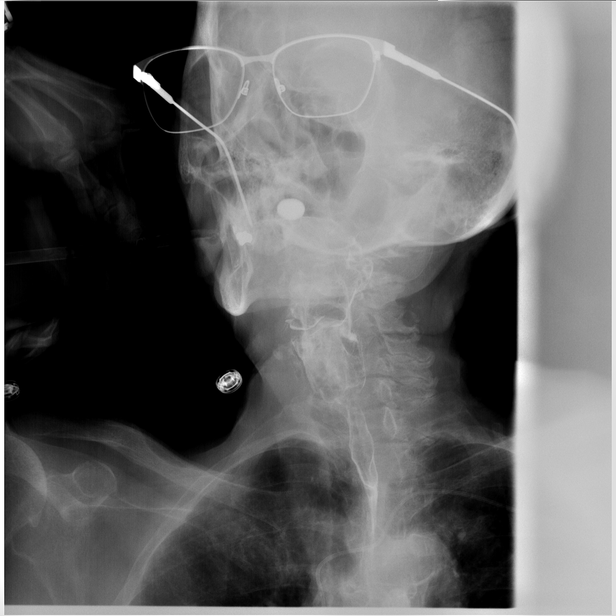
[frame 13/20]
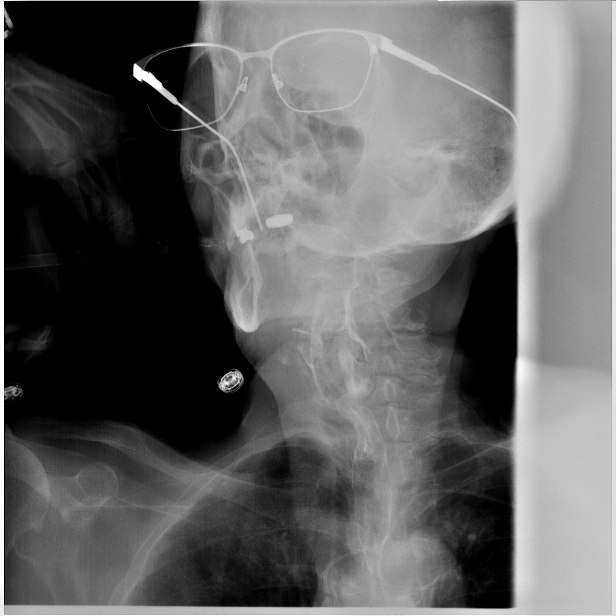

[Series 9: fluoro_barium 2fps_bw · 0.17mm/px · 1 of 13 frames shown (7 of 12)]
[frame 7/13]
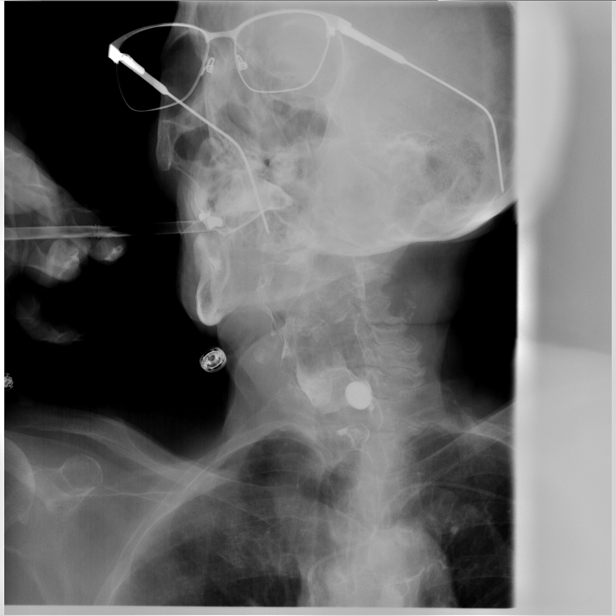

[Series 10: fluoro_barium 2fps_bw · 0.17mm/px · 1 of 10 frames shown (8 of 12)]
[frame 6/10]
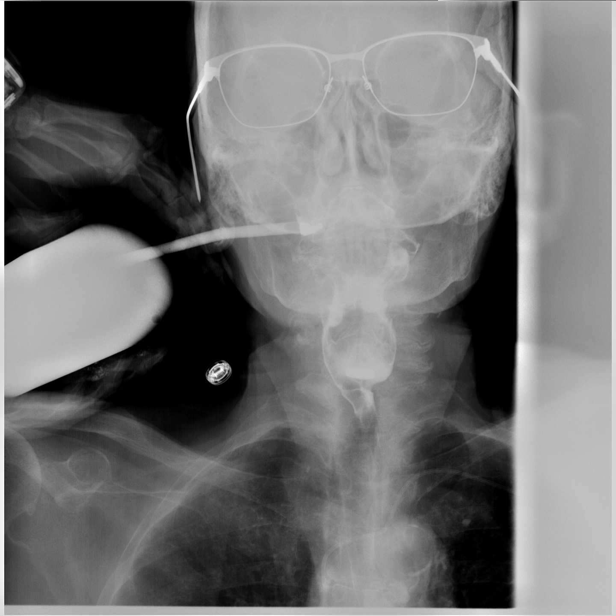

[Series 11: fluoro_barium 2fps_bw · 0.17mm/px · 1 of 13 frames shown (9 of 12)]
[frame 12/13]
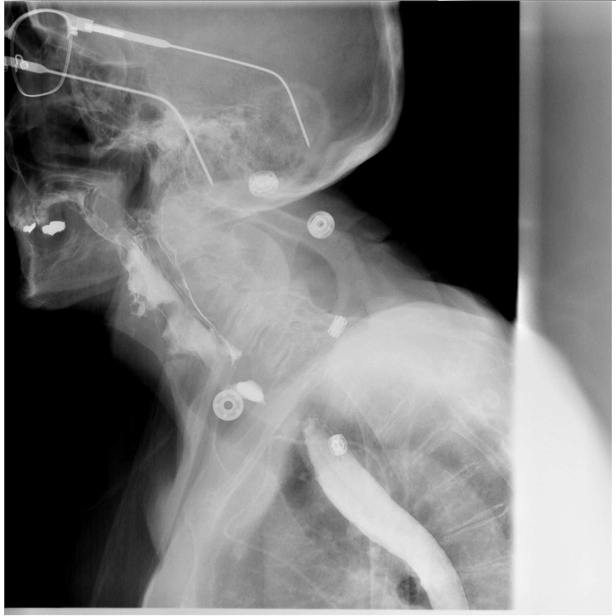

[Series 12: fluoro_barium 2fps_bw · 0.17mm/px · 1 of 1 slices shown (10 of 12)]
[im 1/1]
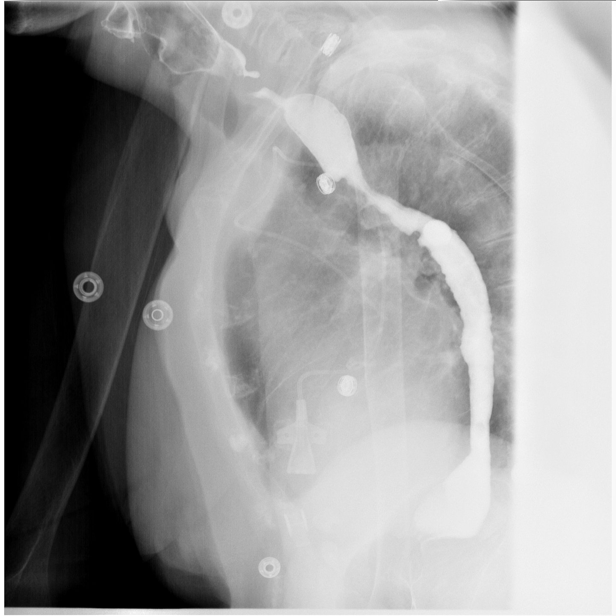

[Series 13: fluoro_barium 2fps_bw · 0.17mm/px · 1 of 10 frames shown (11 of 12)]
[frame 10/10]
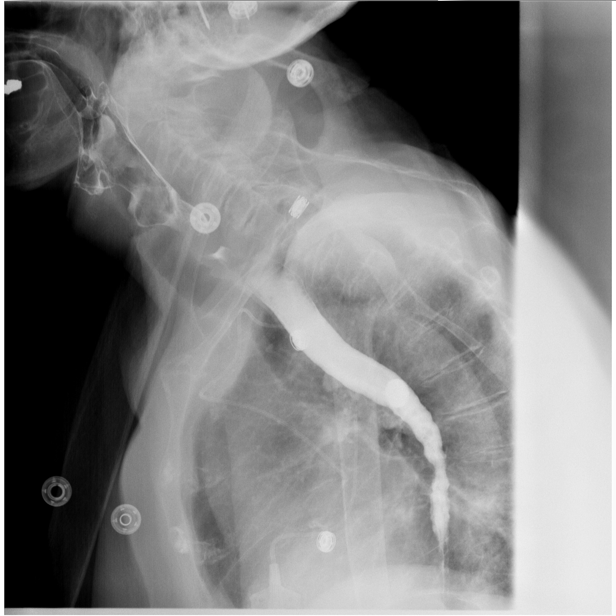

[Series 15: fluoro_barium 2fps_bw · 0.17mm/px · 1 of 1 slices shown (12 of 12)]
[im 1/1]
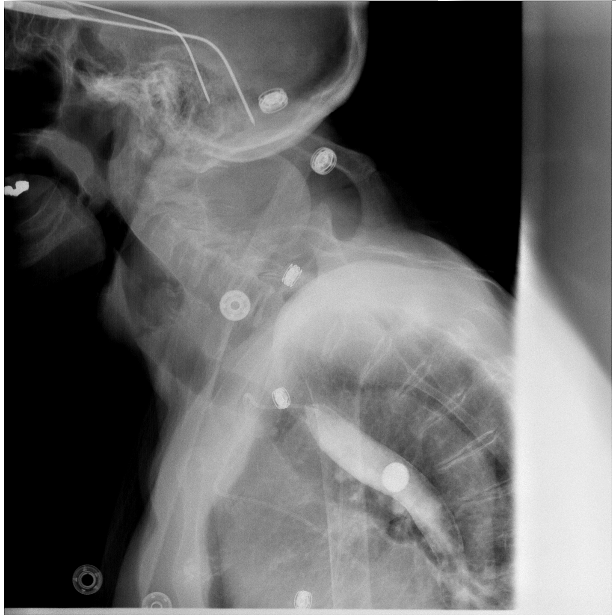

[14 of 24 positions shown; findings below may reference images not displayed]

FINDINGS: The patient was unable to stand but was able to turn with
assistance. The examination was performed in the semi recumbent
position.

The cervical is crash in the hypopharynx distended well. There was
no laryngeal penetration of the barium. There was a small
diverticulum at approximately the C5-6 level as well as a prominent
cricopharyngeus muscle impression. The barium tablets stuck just
above the cricopharyngeus muscle but was ultimately propelled
inferiorly to the junction of the middle and distal thirds of the
esophagus. Here it was allowed to dissolve given that it would not
pass more inferiorly with additional sips of barium or water. In the
thoracic esophagus multiple tertiary contractions were observed.
There was a small incompletely reducible hiatal hernia.
IMPRESSION: No laryngeal penetration of the barium. Normal initiation of the
voluntary component of the swallowing maneuver.

Small pulsion diverticulum at approximately the C5-6 level with a
prominent cricopharyngeus muscle impression just inferior to it. The
barium tablet hung transiently above the cricopharyngeus muscle but
then was propelled into the esophagus.

Changes of presbyesophagus with prominent tertiary contractions but
overall esophageal peristalsis was good for the patient's age and
the semi recumbent position. No high-grade fixed stricture was
observed. Rather, the caliber of the esophagus gradually narrowed
below the junction of the middle and distal thirds and the tablet
would not pass beyond this point.

Small non reducible hiatal hernia.  No reflux was observed.

## 2019-06-04 ENCOUNTER — Other Ambulatory Visit: Payer: Self-pay | Admitting: Student

## 2019-06-04 DIAGNOSIS — M4807 Spinal stenosis, lumbosacral region: Secondary | ICD-10-CM

## 2019-06-04 DIAGNOSIS — Z96649 Presence of unspecified artificial hip joint: Secondary | ICD-10-CM

## 2019-06-04 DIAGNOSIS — M5416 Radiculopathy, lumbar region: Secondary | ICD-10-CM

## 2019-06-04 DIAGNOSIS — M4306 Spondylolysis, lumbar region: Secondary | ICD-10-CM

## 2019-07-13 ENCOUNTER — Telehealth: Payer: Self-pay | Admitting: Nurse Practitioner

## 2019-07-13 NOTE — Telephone Encounter (Signed)
Called patient's daughter Nakeshia Pizzimenti to schedule the Palliative Consult, no answer - left message with reason for call along with my contact information.

## 2019-07-13 NOTE — Telephone Encounter (Signed)
Rec'd message that daughter returned my call.  Called daughter back and after discussing Palliative services she was in agreement with this.  I have scheduled a Telehealth Zoom Consult for 08/11/19 @ 10:30 AM.

## 2019-08-11 ENCOUNTER — Other Ambulatory Visit: Payer: Self-pay

## 2019-08-11 ENCOUNTER — Encounter: Payer: Self-pay | Admitting: Nurse Practitioner

## 2019-08-11 ENCOUNTER — Other Ambulatory Visit: Payer: Medicare HMO | Admitting: Nurse Practitioner

## 2019-08-11 DIAGNOSIS — Z515 Encounter for palliative care: Secondary | ICD-10-CM

## 2019-08-11 DIAGNOSIS — F0390 Unspecified dementia without behavioral disturbance: Secondary | ICD-10-CM

## 2019-08-11 NOTE — Progress Notes (Signed)
[facility] Consult Note Telephone: [phone]  Fax: [phone]  PATIENT NAME: Kimberly Abbott DOB: 08-Mar-1921 MRN: [id number]  PRIMARY CARE PROVIDER:   Patient, No Pcp Per  REFERRING PROVIDER:  Marguerita Abbott, [address],  [place] [id number]  RESPONSIBLE PARTY:   Kimberly Abbott [id number]; [id number]  Due to the COVID-19 crisis, this visit was done via telemedicine from my office and it was initiated and consent by this patient and or family.  RECOMMENDATIONS and PLAN:  1. ACP: DNR; Will put in Vynca/Epic and mail blank MOST form with Hard Choice book. Discussed hospice AV visit where found not elgible. Will continue to monitor weights, with dysphagia, unable to eat, recent hip sgy, age, comorbidities, dyspnea with exertion, then revisit hospice.   2. Palliative care encounter Palliative medicine team will continue to support patient, patient's family, and medical team. Visit consisted of counseling and education dealing with the complex and emotionally intense issues of symptom management and palliative care in the setting of serious and potentially life-threatening illness  I spent 70 minutes providing this consultation,  from 10:00am to 11:40am. More than 50% of the time in this consultation was spent coordinating communication.   HISTORY OF PRESENT ILLNESS:  Kimberly Abbott is a 86 y.o. year old female with multiple medical problems including Mild dementia, hypertension, chronic kidney disease, hyperlipidemia, gerd, allergies. Hospitalized 9 / 7 / 2020 to 4 / 11 / 2020 for a left hip fracture s/p ORIF after mechanical fall at home. During hospitalization she did have a drop in hemoglobin, chronic kidney disease stage 3 which resulted with creatinine at baseline 1.26, urinary tract infection. Ms Kimberly Abbott did go to [facility] for short-term rehab and then discharged home where she currently resides with her daughter.  Prior to hospitalization Ms Kimberly Abbott was living independently at home on her own in an apartment. Ms. Kimberly Abbott was walking, taking care of her adl's, continent and able to feed herself. Appetite had been declined as she is been having difficulty with swallowing and previously has had her esophageal stretched. When Ms Kimberly Abbott was discharged to home from [facility] she moved in with her daughter, Kimberly Abbott. I called Kimberly Abbott and Ms Kimberly Abbott for scheduled telemedicine video initial palliative care visit. We talked about purpose of visit and [name], Ms Kimberly Abbott both in agreement. We talked about how she was feeling. Ms Kimberly Abbott endorses that overall she is tired. Her activity level has really changed since the fall. We talked about symptoms of pain when she does exhibit and takes Tylenol with relief. Ms. Kimberly Abbott does continue to have difficulty with intermittent shortness of breath more with exertion and fatigue. We talked about chronic disease progression. We talked about currently she is not on oxygen. We talked about natural progression of aging. We talked about the importance of rest, recovery time. Kimberly Abbott endorses Ms Kimberly Abbott's routine is sitting and watching [employer] news. Prior to moving in with Kimberly Abbott she was reading books, very active socially. We talked about her sleeping patterns as she does have more trouble sleeping. She is getting up more during the night and has had now the new episodes of urinary incontinence. Discuss with Kimberly Abbott that it appears primary will be doing a referral for Urology to follow up. Kimberly Abbott and agreement. We talked about mild dementia and forgetfulness. Kimberly Abbott endorses since Ms Kimberly Abbott has moved in with her she's been having more trouble with remembering. Kimberly Abbott talks about further discuss thing possibly Aricept  although she is concerned about till burden. Kimberly Abbott endorses she was on multiple medications when she was discharged from [facility] for what she has now stopped. We talked about benefits  vs risks. We talked about progression of dementia. We talked about activities that could benefit. We talked about Life review. Kimberly Abbott is an only child and Ms Kimberly Abbott has been married over 58 years. 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 We talked about quality of life, change in [place] and breathing, loss of the life like she knew it with her husband. We talked about her appetite at blank. Kimberly Abbott talked at length about her esophagus being stretched all do the last time they tried to have it done they decided that it was too risky due to age. Ms Kimberly Abbott does to food and spits a fair amount out as she is not able to swallow she feels like it gets caught in her throat. We talked about her weight which has been relatively stable within a few pounds last weight 106. We talked about different types of food, diet, a speech eval and puree for which Ms Kimberly Abbott does not like. Ms Kimberly Abbott prefers real food which gives her pleasure for Comfort feedings. We talked about supplements. Kimberly Abbott asked about increasing hair loss and we talked about vitamins. We talked about medical goals of care as she is a DNR. We talked about the most form and Kimberly Abbott open to reviewing a blank MOST form, Hard Choice book. Will mail. Will complete a new DNR in place in [place] in mail to [place]. Kimberly Abbott talked at length about her father being on tube feedings with dementia and  repeated visits to the emergency department to have replaced after he is pulled that out. talked about hospice AV visit. We talked about the difference between Kimberly Abbott, palliative care. We talked about role of palliative care and plan of care. Discussed will follow up in 4 weeks if needed or sooner should she declined. Kimberly Abbott and Ms Labell in agreement. Appointment schedule. Therapeutic listening and emotional support provided. Contact information. Questions answered the satisfaction. Palliative Care  was asked to help to continue to address goals of care.   CODE STATUS: DNR  PPS: 40% HOSPICE ELIGIBILITY/DIAGNOSIS: TBD  PAST MEDICAL HISTORY:  Past Medical History:  Diagnosis Date  . Allergy   . CKD (chronic kidney disease)   . GERD (gastroesophageal reflux disease)   . Hyperlipidemia   . Hypertension     SOCIAL HX:  Social History   Tobacco Use  . Smoking status: Former Smoker    Packs/day: 0.25    Years: 40.00    Pack years: 10.00    Types: Cigarettes    Quit date: 1980    Years since quitting: 41.0  . Smokeless tobacco: Never Used  . Tobacco comment: Smoking cessation materials not required  Substance Use Topics  . Alcohol use: No    Alcohol/week: 0.0 standard drinks    ALLERGIES:  Allergies  Allergen Reactions  . Penicillin G Hives  . Penicillins Hives    Has patient had a PCN reaction causing immediate rash, facial/tongue/throat swelling, SOB or lightheadedness with hypotension: No Has patient had a PCN reaction causing severe rash involving mucus membranes or skin necrosis: No Has patient had a PCN reaction that required hospitalization No Has patient had a PCN reaction occurring within the last 10 years: No If all of the above answers are "NO",  then may proceed with Cephalosporin use.     PERTINENT MEDICATIONS:  Outpatient Encounter Medications as of 08/11/2019  Medication Sig  . acetaminophen (TYLENOL) 500 MG tablet Take 2 tablets (1,000 mg total) 2 (two) times daily as needed by mouth.  Marland Kitchen allopurinol (ZYLOPRIM) 100 MG tablet Take 1 tablet (100 mg total) by mouth daily.  Marland Kitchen amLODipine (NORVASC) 5 MG tablet Take 5 mg by mouth daily. for high blood pressure  . aspirin 81 MG chewable tablet Chew 81 mg by mouth daily.   . betamethasone dipropionate (DIPROLENE) 0.05 % cream Apply topically 2 (two) times daily. (Patient not taking: Reported on 04/06/2019)  . cephALEXin (KEFLEX) 250 MG capsule Take 1 capsule (250 mg total) by mouth every 12 (twelve) hours.  .  cetirizine (ZYRTEC) 5 MG tablet Take 1 tablet (5 mg total) by mouth daily. (Patient not taking: Reported on 04/06/2019)  . clobetasol ointment (TEMOVATE) [id number] % Apply 1 application topically 2 (two) times daily.  . clotrimazole-betamethasone (LOTRISONE) cream APPLY  CREAM TOPICALLY TWICE DAILY  . docusate sodium (COLACE) 100 MG capsule Take 1 capsule (100 mg total) by mouth 2 (two) times daily as needed for mild constipation.  . enoxaparin (LOVENOX) 30 MG/0.3ML injection Inject 0.3 mLs (30 mg total) into the skin daily for 14 days.  Marland Kitchen escitalopram (LEXAPRO) 20 MG tablet Take 20 mg by mouth daily.  . feeding supplement, ENSURE ENLIVE, (ENSURE ENLIVE) LIQD Take 237 mLs by mouth 2 (two) times daily between meals.  . mirtazapine (REMERON) 15 MG tablet Take 1 tablet (15 mg total) by mouth at bedtime.  . ondansetron (ZOFRAN) 4 MG tablet Take 1 tablet (4 mg total) by mouth every 6 (six) hours as needed for nausea.  Marland Kitchen oxybutynin (DITROPAN-XL) 5 MG 24 hr tablet Take 5 mg by mouth daily.  Marland Kitchen oxyCODONE (ROXICODONE) 5 MG/5ML solution Take 2.5-5 mLs (2.5-5 mg total) by mouth every 6 (six) hours as needed for moderate pain or severe pain.  Marland Kitchen senna (SENOKOT) 8.6 MG TABS tablet Take 1 tablet (8.6 mg total) by mouth daily.  . simvastatin (ZOCOR) 20 MG tablet Take 20 mg by mouth daily at 6 PM.   . triamcinolone ointment (KENALOG) 0.1 % Apply twice a day to affected areas for back.   No facility-administered encounter medications on file as of 08/11/2019.    PHYSICAL EXAM:   Deferred  Lashawndra Lampkins Z Neya Creegan, NP

## 2019-08-24 ENCOUNTER — Other Ambulatory Visit: Payer: Self-pay

## 2019-08-24 ENCOUNTER — Ambulatory Visit: Payer: Medicare HMO | Admitting: Urology

## 2019-08-24 ENCOUNTER — Encounter: Payer: Self-pay | Admitting: Urology

## 2019-08-24 VITALS — BP 121/75 | HR 83 | Ht 60.0 in | Wt 100.0 lb

## 2019-08-24 DIAGNOSIS — N3941 Urge incontinence: Secondary | ICD-10-CM | POA: Diagnosis not present

## 2019-08-24 DIAGNOSIS — R350 Frequency of micturition: Secondary | ICD-10-CM | POA: Insufficient documentation

## 2019-08-24 LAB — BLADDER SCAN AMB NON-IMAGING: Scan Result: 35

## 2019-08-24 NOTE — Progress Notes (Signed)
08/24/2019 12:26 PM   Carmell Austria 12/30/1920 [id number]  Referring provider: Franciso Bend, MD [place],  [place] [address]  Chief Complaint  Patient presents with  . Urinary Incontinence    HPI: Kimberly Abbott is a 86 YO female seen at the request of Dr. Smith Mince for evaluation of urinary incontinence.  She presents today with her daughter.  She had a hip surgery in September 2020 and since that time has had bothersome urinary incontinence.  Her daughter states she has been on oxybutynin for several years and was recently switched to the extended release version.  She typically does not have the urge to void however when she stands up she will have onset of intense urge which she is unable to suppress.  This is usually worse the first thing in the morning.  She typically voids every 3-4 hours.  Denies nocturia.  Her bathroom is about 15 steps from her bed.  Denies dysuria, gross hematuria or flank, abdominal, pelvic pain.   PMH: Past Medical History:  Diagnosis Date  . Allergy   . CKD (chronic kidney disease)   . GERD (gastroesophageal reflux disease)   . Hyperlipidemia   . Hypertension     Surgical History: Past Surgical History:  Procedure Laterality Date  . APPENDECTOMY    . ESOPHAGOGASTRODUODENOSCOPY (EGD) WITH PROPOFOL N/A 03/12/2017   Procedure: ESOPHAGOGASTRODUODENOSCOPY (EGD) WITH PROPOFOL;  Surgeon: Lucilla Lame, MD;  Location: [facility] ENDOSCOPY;  Service: Endoscopy;  Laterality: N/A;  . HIP ARTHROPLASTY Left 04/07/2019   Procedure: ARTHROPLASTY BIPOLAR HIP (HEMIARTHROPLASTY);  Surgeon: Corky Mull, MD;  Location: [facility] ORS;  Service: Orthopedics;  Laterality: Left;  . rectal fistula  in the 40's  . TUBAL LIGATION      Home Medications:  Allergies as of 08/24/2019      Reactions   Penicillin G Hives   Penicillins Hives   Has patient had a PCN reaction causing immediate rash, facial/tongue/throat swelling, SOB or lightheadedness with hypotension:  No Has patient had a PCN reaction causing severe rash involving mucus membranes or skin necrosis: No Has patient had a PCN reaction that required hospitalization No Has patient had a PCN reaction occurring within the last 10 years: No If all of the above answers are "NO", then may proceed with Cephalosporin use.      Medication List       Accurate as of August 24, 2019 12:26 PM. If you have any questions, ask your nurse or doctor.        acetaminophen 500 MG tablet Commonly known as: TYLENOL Take 2 tablets (1,000 mg total) 2 (two) times daily as needed by mouth.   allopurinol 100 MG tablet Commonly known as: ZYLOPRIM Take 1 tablet (100 mg total) by mouth daily.   amLODipine 5 MG tablet Commonly known as: NORVASC Take 5 mg by mouth daily. for high blood pressure   aspirin 81 MG chewable tablet Chew 81 mg by mouth daily.   betamethasone dipropionate 0.05 % cream Apply topically 2 (two) times daily.   cephALEXin 250 MG capsule Commonly known as: KEFLEX Take 1 capsule (250 mg total) by mouth every 12 (twelve) hours.   cetirizine 5 MG tablet Commonly known as: ZYRTEC Take 1 tablet (5 mg total) by mouth daily.   clobetasol ointment 0.05 % Commonly known as: Temovate Apply 1 application topically 2 (two) times daily.   clotrimazole-betamethasone cream Commonly known as: LOTRISONE APPLY  CREAM TOPICALLY TWICE DAILY   docusate sodium  100 MG capsule Commonly known as: COLACE Take 1 capsule (100 mg total) by mouth 2 (two) times daily as needed for mild constipation.   enoxaparin 30 MG/0.3ML injection Commonly known as: LOVENOX Inject 0.3 mLs (30 mg total) into the skin daily for 14 days.   escitalopram 20 MG tablet Commonly known as: LEXAPRO Take 20 mg by mouth daily.   feeding supplement (ENSURE ENLIVE) Liqd Take 237 mLs by mouth 2 (two) times daily between meals.   mirtazapine 15 MG tablet Commonly known as: REMERON Take 1 tablet (15 mg total) by mouth at  bedtime.   ondansetron 4 MG tablet Commonly known as: ZOFRAN Take 1 tablet (4 mg total) by mouth every 6 (six) hours as needed for nausea.   oxybutynin 5 MG 24 hr tablet Commonly known as: DITROPAN-XL Take 5 mg by mouth daily.   oxyCODONE 5 MG/5ML solution Commonly known as: ROXICODONE Take 2.5-5 mLs (2.5-5 mg total) by mouth every 6 (six) hours as needed for moderate pain or severe pain.   senna 8.6 MG Tabs tablet Commonly known as: SENOKOT Take 1 tablet (8.6 mg total) by mouth daily.   simvastatin 20 MG tablet Commonly known as: ZOCOR Take 20 mg by mouth daily at 6 PM.   triamcinolone ointment 0.1 % Commonly known as: KENALOG Apply twice a day to affected areas for back.       Allergies:  Allergies  Allergen Reactions  . Penicillin G Hives  . Penicillins Hives    Has patient had a PCN reaction causing immediate rash, facial/tongue/throat swelling, SOB or lightheadedness with hypotension: No Has patient had a PCN reaction causing severe rash involving mucus membranes or skin necrosis: No Has patient had a PCN reaction that required hospitalization No Has patient had a PCN reaction occurring within the last 10 years: No If all of the above answers are "NO", then may proceed with Cephalosporin use.    Family History: Family History  Problem Relation Age of Onset  . Diabetes Mother     Social History:  reports that she quit smoking about 41 years ago. Her smoking use included cigarettes. She has a 10.00 pack-year smoking history. She has never used smokeless tobacco. She reports that she does not drink alcohol or use drugs.  ROS: UROLOGY Frequent Urination?: Yes Hard to postpone urination?: No Burning/pain with urination?: No Get up at night to urinate?: No Leakage of urine?: Yes Urine stream starts and stops?: No Trouble starting stream?: No Do you have to strain to urinate?: No Blood in urine?: No Urinary tract infection?: No Sexually transmitted disease?:  No Injury to kidneys or bladder?: No Painful intercourse?: No Weak stream?: No Currently pregnant?: No Vaginal bleeding?: No Last menstrual period?: n  Gastrointestinal Nausea?: No Vomiting?: No Indigestion/heartburn?: No Diarrhea?: Yes Constipation?: No  Constitutional Fever: No Night sweats?: No Weight loss?: Yes Fatigue?: No  Skin Skin rash/lesions?: No Itching?: No  Eyes Blurred vision?: No Double vision?: No  Ears/Nose/Throat Sore throat?: No Sinus problems?: Yes  Hematologic/Lymphatic Swollen glands?: No Easy bruising?: No  Cardiovascular Leg swelling?: No Chest pain?: No  Respiratory Cough?: No Shortness of breath?: Yes  Endocrine Excessive thirst?: No  Musculoskeletal Back pain?: No Joint pain?: Yes  Neurological Headaches?: No Dizziness?: No  Psychologic Depression?: No Anxiety?: Yes  Physical Exam: BP 121/75   Pulse 83   Ht 5' (1.524 m)   Wt 100 lb (45.4 kg)   BMI 19.53 kg/m   Constitutional:  Alert, No acute distress.  HEENT: [place] AT, moist mucus membranes.  Trachea midline, no masses. Cardiovascular: No clubbing, cyanosis, or edema. Respiratory: Normal respiratory effort, no increased work of breathing. Skin: No rashes, bruises or suspicious lesions. Neurologic: Grossly intact, no focal deficits, moving all 4 extremities. Psychiatric: Normal mood and affect.   Assessment & Plan:    - Urge incontinence PVR by bladder scan today was 35 mL.  Her daughter states her symptoms are present first thing in the morning and she typically does not have problems during the day.  She has been on oxybutynin for several years.  Would avoid anticholinergic medication in her age..  We will have her discontinue and she was given Myrbetriq samples 25 mg.  I also suggested that may want to consider a bedside commode to decrease her transit time from bed to toilet.  Follow-up virtual visit 1 month for reassessment of symptoms on Myrbetriq.  I am sure  is she reconsidering now receiving  Abbie Sons, MD  [facility] [address] [place], [place] [address] [phone]

## 2019-09-14 ENCOUNTER — Telehealth: Payer: Self-pay | Admitting: Nurse Practitioner

## 2019-09-14 ENCOUNTER — Other Ambulatory Visit: Payer: Medicare HMO | Admitting: Nurse Practitioner

## 2019-09-14 ENCOUNTER — Other Ambulatory Visit: Payer: Self-pay

## 2019-09-14 ENCOUNTER — Encounter: Payer: Self-pay | Admitting: Nurse Practitioner

## 2019-09-14 DIAGNOSIS — Z515 Encounter for palliative care: Secondary | ICD-10-CM

## 2019-09-14 DIAGNOSIS — F0390 Unspecified dementia without behavioral disturbance: Secondary | ICD-10-CM

## 2019-09-14 NOTE — Progress Notes (Signed)
[facility] Consult Note Telephone: [phone]  Fax: [phone]  PATIENT NAME: Kimberly Abbott DOB: September 10, 1920 MRN: [id number]  PRIMARY CARE PROVIDER:   Dr  Altamese Dilling PROVIDER:  Marguerita Merles, [place],  [place] [address]  RESPONSIBLE PARTY:   Derek Jack Tynita Sullens [id number]; [id number]  Due to the COVID-19 crisis, this visit was done via telemedicine from my office and it was initiated and consent by this patient and or family.  RECOMMENDATIONS and PLAN:  1. ACP: DNR; in Vynca/Epic and mail blank MOST form with [facility] book. Discussed hospice will review with Hospice Physians, Will continue to monitor weights, with dysphagia, unable to eat, recent hip sgy, age, comorbidities, dyspnea with exertion, then revisit hospice.   2. Palliative care encounter Palliative medicine team will continue to support patient, patient's family, and medical team. Visit consisted of counseling and education dealing with the complex and emotionally intense issues of symptom management and palliative care in the setting of serious and potentially life-threatening illness  I spent 65 minutes providing this consultation,  From 10:45am to 11: 50am. More than 50% of the time in this consultation was spent coordinating communication.   HISTORY OF PRESENT ILLNESS:  Kimberly Abbott is a 86 y.o. year old female with multiple medical problems including mild dementia, hypertension, chronic kidney disease, hyperlipidemia, gerd, allergies. Hospitalized 9 / 7 / 2020 to 60 / 11 / 2020 for a left hip fracture s/p ORIF after mechanical fall at home. Scheduled in person palliative care follow-up visit. I went to see Kimberly Abbott at her daughter Kimberly Abbott's home. We talked about purpose of palliative is it, Kimberly Abbott  in agreement. We talked about how much Kimberly Abbott has been feeling. Kimberly Abbott endorses she has been over all just very tired. Kimberly Abbott  endorses that she does have an occasional pain in her neck that comes and goes. Kimberly Abbott  endorses she also has pain in her heels that caused her discomfort at night. Kimberly Abbott  describes this pain is neuropathy, sharp tingling when she puts her feet up that are throbbing. We talked about different alternatives including Gabapentin. Discussed with Hava, Kimberly Abbott daughter the concern with gabapentin and increase risk of Falls. Nyha endorses that would not be a good option. We talked about Kimberly Abbott  shortness of breath. Kimberly Abbott and doors as she walks five steps and becomes very short of breath and has to rest. Kimberly Abbott endorses her heart rate increases and it feels like her heart is beating out of her chest. With rest this improves. We talked about her appetite being poor with little oral intake. Kimberly Abbott  endorses that she continues to spit food out with very little oral intake. Kimberly Abbott  endorses that her Dentures no longer fit of which they did a few weeks ago. We talked about weight loss. Measured arm circumference and thigh circumference completed. Kimberly Abbott endorses about five years ago she was a size 27 and now she's a size. Kimberly Abbott endorses she continues to lose weight but a scale unavailable. Kimberly Abbott endorses she knows it's more than a few pounds. Kimberly Abbott talked at length about her age. We talked about recent events including surgery. We talked about roll a palliative care and plan of care. We talked about Hospice Services. Completed orthostatic so unable to lay down started from sitting two standing. Kimberly Mohler was very orthostatic would they 60 point drop in  systolic and heart rate from 88 to 140. We talked about chronic dehydration. We talked about importance of oral hydration. We talked about option of oxygen should hospice eligible. Discuss with Kimberly Abbott and [name] will review with Hospice Physicians will contact once determine if eligible. [name] and Kimberly Abbott  in agreement.  Therapeutic listening and emotional support provided. Contact information provided. Kimberly Abbott was very grateful for palliative care in person visit.  Right humerus 8 inches Right thigh 16 inches Sitting 130/72, hr 90 Standing 80/50, hr 150   Palliative Care was asked to help to continue to address goals of care.   CODE STATUS: DNR  PPS: 40% HOSPICE ELIGIBILITY/DIAGNOSIS: TBD  PAST MEDICAL HISTORY:  Past Medical History: 86 Diagnosis Date  . Allergy   . CKD (chronic kidney disease)   . GERD (gastroesophageal reflux disease)   . Hyperlipidemia   . Hypertension     SOCIAL HX:  Social History   Tobacco Use  . Smoking status: Former Smoker    Packs/day: 0.25    Years: 40.00    Pack years: 10.00    Types: Cigarettes    Quit date: 1980    Years since quitting: 41.1  . Smokeless tobacco: Never Used  . Tobacco comment: Smoking cessation materials not required  Substance Use Topics  . Alcohol use: No    Alcohol/week: 0.0 standard drinks    ALLERGIES:  Allergies  Allergen Reactions  . Penicillin G Hives  . Penicillins Hives    Has patient had a PCN reaction causing immediate rash, facial/tongue/throat swelling, SOB or lightheadedness with hypotension: No Has patient had a PCN reaction causing severe rash involving mucus membranes or skin necrosis: No Has patient had a PCN reaction that required hospitalization No Has patient had a PCN reaction occurring within the last 10 years: No If all of the above answers are "NO", then may proceed with Cephalosporin use.     PERTINENT MEDICATIONS:  Outpatient Encounter Medications as of 09/14/2019  Medication Sig  . acetaminophen (TYLENOL) 500 MG tablet Take 2 tablets (1,000 mg total) 2 (two) times daily as needed by mouth.  Marland Kitchen allopurinol (ZYLOPRIM) 100 MG tablet Take 1 tablet (100 mg total) by mouth daily.  Marland Kitchen amLODipine (NORVASC) 5 MG tablet Take 5 mg by mouth daily. for high blood pressure  . aspirin 81 MG chewable tablet Chew 81  mg by mouth daily.   . betamethasone dipropionate (DIPROLENE) 0.05 % cream Apply topically 2 (two) times daily.  . cephALEXin (KEFLEX) 250 MG capsule Take 1 capsule (250 mg total) by mouth every 12 (twelve) hours.  . cetirizine (ZYRTEC) 5 MG tablet Take 1 tablet (5 mg total) by mouth daily.  . clobetasol ointment (TEMOVATE) [id number] % Apply 1 application topically 2 (two) times daily.  . clotrimazole-betamethasone (LOTRISONE) cream APPLY  CREAM TOPICALLY TWICE DAILY  . docusate sodium (COLACE) 100 MG capsule Take 1 capsule (100 mg total) by mouth 2 (two) times daily as needed for mild constipation.  . enoxaparin (LOVENOX) 30 MG/0.3ML injection Inject 0.3 mLs (30 mg total) into the skin daily for 14 days.  Marland Kitchen escitalopram (LEXAPRO) 20 MG tablet Take 20 mg by mouth daily.  . feeding supplement, ENSURE ENLIVE, (ENSURE ENLIVE) LIQD Take 237 mLs by mouth 2 (two) times daily between meals.  . mirtazapine (REMERON) 15 MG tablet Take 1 tablet (15 mg total) by mouth at bedtime.  . ondansetron (ZOFRAN) 4 MG tablet Take 1 tablet (4 mg total) by mouth every 6 (  six) hours as needed for nausea.  Marland Kitchen oxybutynin (DITROPAN-XL) 5 MG 24 hr tablet Take 5 mg by mouth daily.  Marland Kitchen oxyCODONE (ROXICODONE) 5 MG/5ML solution Take 2.5-5 mLs (2.5-5 mg total) by mouth every 6 (six) hours as needed for moderate pain or severe pain.  Marland Kitchen senna (SENOKOT) 8.6 MG TABS tablet Take 1 tablet (8.6 mg total) by mouth daily.  . simvastatin (ZOCOR) 20 MG tablet Take 20 mg by mouth daily at 6 PM.   . triamcinolone ointment (KENALOG) 0.1 % Apply twice a day to affected areas for back.   No facility-administered encounter medications on file as of 09/14/2019.    PHYSICAL EXAM:   General: elderly, frail appearing, thin, pleasant female Cardiovascular: regular rate and rhythm Pulmonary: clear ant fields Extremities: no edema, no joint deformities/+Muscle wasting Neurological: slow gait  Mcarthur Ivins Ihor Gully, NP

## 2019-09-14 NOTE — Telephone Encounter (Signed)
I called to do in person visit, unable to leave message, will continue to try

## 2019-09-24 ENCOUNTER — Telehealth (INDEPENDENT_AMBULATORY_CARE_PROVIDER_SITE_OTHER): Payer: Medicare HMO | Admitting: Urology

## 2019-09-24 ENCOUNTER — Other Ambulatory Visit: Payer: Self-pay

## 2019-09-24 DIAGNOSIS — R3981 Functional urinary incontinence: Secondary | ICD-10-CM | POA: Diagnosis not present

## 2019-09-24 MED ORDER — MIRABEGRON ER 50 MG PO TB24: 50.0000 mg | ORAL_TABLET | Freq: Every day | ORAL | 0 refills | Status: AC

## 2019-09-24 NOTE — Progress Notes (Signed)
Virtual Visit via Telephone Note  I connected with Kimberly Abbott on 09/24/19 at  2:00 PM EST by telephone and verified that I am speaking with the correct person using two identifiers.  Location: Patient: Home Provider: [facility] office   I discussed the limitations, risks, security and privacy concerns of performing an evaluation and management service by telephone and the availability of in person appointments. I also discussed with the patient that there may be a patient responsible charge related to this service. The patient expressed understanding and agreed to proceed.   History of Present Illness: 87 yo female initially seen 08/24/2019 for urinary incontinence which typically occurred in the morning.  She was given a trial of Myrbetriq 25 mg and states this has not been effective.  Her symptoms are not bothersome.   Observations/Objective: N/A  Assessment and Plan: 86 y.o. female with bothersome urinary incontinence.  Will increase Myrbetriq to 50 mg.  Follow Up Instructions: She will call back in 1 month to report efficacy of this medication.   I discussed the assessment and treatment plan with the patient. The patient was provided an opportunity to ask questions and all were answered. The patient agreed with the plan and demonstrated an understanding of the instructions.   The patient was advised to call back or seek an in-person evaluation if the symptoms worsen or if the condition fails to improve as anticipated.  I provided 10 minutes of non-face-to-face time during this encounter.   Abbie Sons, MD

## 2021-03-08 ENCOUNTER — Telehealth: Payer: Self-pay | Admitting: Nurse Practitioner

## 2021-03-08 NOTE — Telephone Encounter (Signed)
Spoke with patient's daughter, Kimberly Abbott, regarding the Palliative referral/services and after discussing this and answering all questions, daughter has declined in-home Palliative services at this time.  Her main concern for the patient currently is having someone help bathe the patient, which we do not provide this service.  Informed daughter that I would cancel the referral and that if she changed her mind and needed our services to please notify the PCP so that they could send [place] another referral and she was in agreement with this.

## 2022-04-29 DEATH — deceased
